# Patient Record
Sex: Female | Born: 1975 | Race: White | Hispanic: No | Smoking: Current every day smoker
Health system: Southern US, Community
[De-identification: ages and names within clinical notes are randomized; demographics above are authoritative.]

## PROBLEM LIST (undated history)

## (undated) DIAGNOSIS — C3490 Malignant neoplasm of unspecified part of unspecified bronchus or lung: Secondary | ICD-10-CM

## (undated) DIAGNOSIS — R002 Palpitations: Secondary | ICD-10-CM

## (undated) DIAGNOSIS — F419 Anxiety disorder, unspecified: Secondary | ICD-10-CM

## (undated) DIAGNOSIS — F32A Depression, unspecified: Secondary | ICD-10-CM

## (undated) DIAGNOSIS — R079 Chest pain, unspecified: Secondary | ICD-10-CM

## (undated) DIAGNOSIS — R569 Unspecified convulsions: Secondary | ICD-10-CM

## (undated) HISTORY — DX: Chest pain, unspecified: R07.9

## (undated) HISTORY — DX: Palpitations: R00.2

## (undated) HISTORY — DX: Anxiety disorder, unspecified: F41.9

## (undated) HISTORY — DX: Unspecified convulsions: R56.9

## (undated) HISTORY — PX: TUBAL LIGATION: SHX77

## (undated) HISTORY — PX: MYRINGOTOMY WITH TUBE PLACEMENT: SHX5663

## (undated) HISTORY — PX: CARDIAC SURGERY: SHX584

## (undated) HISTORY — DX: Malignant neoplasm of unspecified part of unspecified bronchus or lung: C34.90

## (undated) HISTORY — DX: Depression, unspecified: F32.A

---

## 2020-12-04 ENCOUNTER — Emergency Department (HOSPITAL_COMMUNITY)
Admission: EM | Admit: 2020-12-04 | Discharge: 2020-12-04 | Disposition: A | Discharge status: Home / Self Care | Payer: Medicaid Other | Attending: Emergency Medicine | Admitting: Emergency Medicine

## 2020-12-04 ENCOUNTER — Encounter (HOSPITAL_COMMUNITY): Payer: Self-pay

## 2020-12-04 ENCOUNTER — Emergency Department (HOSPITAL_COMMUNITY): Payer: Medicaid Other

## 2020-12-04 ENCOUNTER — Other Ambulatory Visit: Payer: Self-pay

## 2020-12-04 DIAGNOSIS — R55 Syncope and collapse: Secondary | ICD-10-CM | POA: Diagnosis not present

## 2020-12-04 DIAGNOSIS — R059 Cough, unspecified: Secondary | ICD-10-CM | POA: Diagnosis present

## 2020-12-04 DIAGNOSIS — Z8701 Personal history of pneumonia (recurrent): Secondary | ICD-10-CM | POA: Diagnosis not present

## 2020-12-04 NOTE — ED Provider Notes (Signed)
[facility] DEPT Provider Note   CSN: [id number] Arrival date & time: 12/04/20  1109     History Chief Complaint  Patient presents with  . Cough    Andrea Holloway is a 45 y.o. female.  Pt reports she was seen in the [facility] ED and diagnosed with pneumonia.  Pt had a follow up ct and has a mass.  Pt is scheduled to follow up with pulmonary in [place] on 4/11.  Pt reports she coughed so   No language interpreter was used.  Cough Cough characteristics:  Productive Sputum characteristics:  Nondescript Severity:  Moderate Onset quality:  Gradual Timing:  Constant Progression:  Worsening Chronicity:  New Relieved by:  Nothing Worsened by:  Nothing Ineffective treatments:  None tried Risk factors: recent infection        History reviewed. No pertinent past medical history.  There are no problems to display for this patient.   History reviewed. No pertinent surgical history.   OB History   No obstetric history on file.     History reviewed. No pertinent family history.     Home Medications Prior to Admission medications   Not on File    Allergies    Iodine  Review of Systems   Review of Systems  Respiratory: Positive for cough.   All other systems reviewed and are negative.   Physical Exam Updated Vital Signs BP 121/87   Pulse 84   Temp 97.7 F (36.5 C) (Oral)   Resp 18   LMP 12/04/2020   SpO2 98%   Physical Exam Vitals reviewed.  HENT:     Head: Normocephalic.     Mouth/Throat:     Mouth: Mucous membranes are moist.  Cardiovascular:     Rate and Rhythm: Normal rate.     Pulses: Normal pulses.  Pulmonary:     Effort: Pulmonary effort is normal.  Musculoskeletal:        General: Normal range of motion.     Cervical back: Normal range of motion.  Skin:    General: Skin is warm.  Neurological:     General: No focal deficit present.     Mental Status: She is alert.  Psychiatric:        Mood and Affect: Mood  normal.     ED Results / Procedures / Treatments   Labs (all labs ordered are listed, but only abnormal results are displayed) Labs Reviewed - No data to display  EKG None  Radiology DG Chest [facility] 1 View  Result Date: 12/04/2020 CLINICAL DATA:  Progressive cough and back pain. EXAM: PORTABLE CHEST 1 VIEW COMPARISON:  Radiographs 11/29/2020 and 11/18/2020.  CT 11/29/2020. FINDINGS: 1158 hours. The heart size and mediastinal contours are stable with prominent right hilar and paratracheal soft tissue prominence. Underlying calcified right hilar and mediastinal lymph nodes are present. There is persistent consolidation and volume loss anteriorly in the right upper lobe, similar to recent prior studies. A small sub pulmonic right pleural effusion appears unchanged. The left lung is clear. A small metallic foreign body in the left retrocrural area appears unchanged. The bones are stable. IMPRESSION: Stable appearance of the chest with persistent mass-like right upper lobe consolidation suspicious for underlying malignancy as reported on recent CT. No acute superimposed findings identified. Electronically Signed   By: Richardean Sale M.D.   On: 12/04/2020 12:29    Procedures Procedures   Medications Ordered in ED Medications - No data to display  ED Course  I have reviewed the triage vital signs and the nursing notes.  Pertinent labs & imaging results that were available during my care of the patient were reviewed by me and considered in my medical decision making (see chart for details).    MDM Rules/Calculators/A&P                          MDM:  Pt counseled on chest xray results.  Pt has finished a course of cefdinir.  Pt offered rx for cough medication.  Pt did not have a pharmacy in chart, when I returned to room to ask her what pharmacy she uses, she had left without discharge papers  Final Clinical Impression(s) / ED Diagnoses Final diagnoses:  Cough  Vasovagal response    Rx /  DC Orders ED Discharge Orders    None    An After Visit Summary was printed and given to the patient.    Fransico Meadow, [place] 12/04/20 1431    Wyvonnia Dusky, MD 12/04/20 [phone]

## 2020-12-04 NOTE — ED Triage Notes (Signed)
Pt reports going to [facility] a few weeks ago and was diagnosed with pneumonia. Pt was prescribed antibiotics, but reported going back to the hospital because she ws not getting better. Pt states a CT scan showed a lump on her right lung and emphysema in her left. Pt states she is here today because of worsening cough to the point that she feels like she is going to pass out and back pain.

## 2020-12-04 NOTE — Discharge Instructions (Signed)
Return if any problems.  Call pulmonary tomorrow to discuss earlier appointment

## 2020-12-14 DIAGNOSIS — R918 Other nonspecific abnormal finding of lung field: Secondary | ICD-10-CM

## 2021-01-09 DIAGNOSIS — R918 Other nonspecific abnormal finding of lung field: Secondary | ICD-10-CM

## 2021-01-20 ENCOUNTER — Telehealth: Payer: Self-pay | Admitting: Oncology

## 2021-01-20 NOTE — Telephone Encounter (Signed)
Patient referred by Dr Gardiner Rhyme for Lung CA.  Appt made for 02/03/21 Labs 10:30 am - Consult 11:00 am

## 2021-01-24 NOTE — Progress Notes (Signed)
Patient called she is in a lot of pain and states that she has a fever as well. She thinks that she needs a MRI of the brian but the referring doctor will not order her one. She is not scheduled to see Dr. Bobby Rumpf until 02/03/2021. I spoke with Amy, triage nurse, and Lenna Sciara, NP. They recommended that she go to the ED because they cannot order anything until patient is seen. I called patient back and let her know, she stated that she was going to go to the ED at [facility] in [place].

## 2021-02-02 ENCOUNTER — Other Ambulatory Visit: Payer: Self-pay | Admitting: Oncology

## 2021-02-02 DIAGNOSIS — C3411 Malignant neoplasm of upper lobe, right bronchus or lung: Secondary | ICD-10-CM

## 2021-02-02 NOTE — Progress Notes (Signed)
Andrea Holloway  [address] [place],  [place]  [address] [phone]  Clinic Day:  02/03/2021  Referring physician:  Gardiner Rhyme, MD   HISTORY OF PRESENT ILLNESS:  The patient is a 45 y.o. female who I was asked to consult upon for newly diagnosed lung cancer.  Her history dates back 18 months ago when she began having right sided chest and back pain.  At the beginning of this year, she began having a productive cough.  A chest x-ray in March 2022 suggested pneumonia, for which she was given antibiotics.  As her symptoms did not improve, a chest CT was done in early April 2022, which showed a consolidative mass in her upper right lung, which appeared to be abutting her mediastinum.  She also had a PET scan done, which showed this lesion to be hypermetabolic.  Hypermetabolic nodes were seen in her right paratracheal and subcarinal stations.  She ultimately underwent a bronchoscopy with biopsy of her lung mass, whose pathology came back consistent with adenocarcinoma.  She had a brain MRI done yesterday, which fortunately did not show brain metastasis.  She comes in today to go over her biopsy and scan results, as well as their implications.  The patient continues to cough fairly heavily, with her right-sided chest wall pain remaining prominent.  She denies having hemoptysis.  She has smoked up to a pack of cigarettes daily x 25 years. She has lost less than 10 pounds over the past 6 months.    PAST MEDICAL HISTORY:   Past Medical History:  Diagnosis Date  . Anxiety   . Depression   . Lung cancer ([place])   . Seizures ([place])     PAST SURGICAL HISTORY:   Past Surgical History:  Procedure Laterality Date  . CARDIAC SURGERY     [place] OF FLUID BEHIND HEART @ 45 YEARS OF AGE  . MYRINGOTOMY WITH TUBE PLACEMENT Bilateral   . TUBAL LIGATION      CURRENT MEDICATIONS:   Current Outpatient Medications  Medication Sig Dispense Refill  . HYDROcodone-acetaminophen  (NORCO) 10-325 MG tablet Take 1 tablet by mouth every 6 (six) hours as needed. 40 tablet 0   No current facility-administered medications for this visit.    ALLERGIES:   Allergies  Allergen Reactions  . Iodine     FAMILY HISTORY:   Family History  Problem Relation Age of Onset  . Hodgkin's lymphoma Sister   . Breast cancer Maternal Aunt   . Thyroid cancer Maternal Grandmother   . Prostate cancer Maternal Grandfather   . Penile cancer Maternal Grandfather   . Colon cancer Cousin   . Colon cancer Cousin   . Colon cancer Cousin   . Lung cancer Cousin   . Brain cancer Cousin     SOCIAL HISTORY:  The patient was born in [place].  She lives with her daughter in [place].  She has 3 children and 2 grandchildren.  She had a cleaning service business for 6 years.  Her smoking history is as mentioned per HPI.  She does not drink alcohol.    REVIEW OF SYSTEMS:  Review of Systems  Constitutional: Negative for fatigue and fever.  HENT:   Negative for hearing loss and sore throat.        Right-sided otalgia  Eyes: Negative for eye problems.  Respiratory: Positive for cough. Negative for chest tightness and hemoptysis.   Cardiovascular: Negative for chest pain and palpitations.  Gastrointestinal: Positive  for constipation. Negative for abdominal distention, abdominal pain, blood in stool, diarrhea, nausea and vomiting.  Endocrine: Negative for hot flashes.  Genitourinary: Negative for difficulty urinating, dysuria, frequency, hematuria and nocturia.   Musculoskeletal: Positive for neck pain. Negative for arthralgias, back pain, gait problem and myalgias.  Skin: Negative.  Negative for itching and rash.  Neurological: Positive for dizziness and headaches. Negative for extremity weakness, gait problem, light-headedness and numbness.  Hematological: Negative.   Psychiatric/Behavioral: Negative for depression and suicidal ideas. The patient is nervous/anxious.      PHYSICAL  EXAM:  Blood pressure 135/85, pulse 78, temperature 98.1 F (36.7 C), resp. rate 16, height 5\' 4"  (1.626 m), weight 147 lb 1.6 oz (66.7 kg), SpO2 98 %. Wt Readings from Last 3 Encounters:  02/03/21 147 lb 1.6 oz (66.7 kg)   Body mass index is 25.25 kg/m. Performance status (ECOG): 1 - Symptomatic but completely ambulatory Physical Exam Constitutional:      Appearance: Normal appearance. She is not ill-appearing.  HENT:     Mouth/Throat:     Mouth: Mucous membranes are moist.     Pharynx: Oropharynx is clear. No oropharyngeal exudate or posterior oropharyngeal erythema.  Cardiovascular:     Rate and Rhythm: Normal rate and regular rhythm.     Heart sounds: No murmur heard. No friction rub. No gallop.   Pulmonary:     Effort: Pulmonary effort is normal. No respiratory distress.     Breath sounds: Normal breath sounds. No wheezing, rhonchi or rales.  Chest:  Breasts:     Right: No axillary adenopathy or supraclavicular adenopathy.     Left: No axillary adenopathy or supraclavicular adenopathy.    Abdominal:     General: Bowel sounds are normal. There is no distension.     Palpations: Abdomen is soft. There is no mass.     Tenderness: There is no abdominal tenderness.  Musculoskeletal:        General: No swelling.     Right lower leg: No edema.     Left lower leg: No edema.  Lymphadenopathy:     Cervical: No cervical adenopathy.     Upper Body:     Right upper body: No supraclavicular or axillary adenopathy.     Left upper body: No supraclavicular or axillary adenopathy.     Lower Body: No right inguinal adenopathy. No left inguinal adenopathy.  Skin:    General: Skin is warm.     Coloration: Skin is not jaundiced.     Findings: No lesion or rash.  Neurological:     General: No focal deficit present.     Mental Status: She is alert and oriented to person, place, and time. Mental status is at baseline.     Cranial Nerves: Cranial nerves are intact.  Psychiatric:         Mood and Affect: Mood normal.        Behavior: Behavior normal.        Thought Content: Thought content normal.    LABS:   CBC Latest Ref Rng & Units 02/03/2021  WBC - 8.9  Hemoglobin 12.0 - 16.0 15.7  Hematocrit 36 - 46 45  Platelets 150 - 399 301   CMP Latest Ref Rng & Units 02/03/2021  BUN 4 - 21 6  Creatinine 0.5 - 1.1 0.6  Sodium 137 - 147 134(A)  Potassium 3.4 - 5.3 3.9  Chloride 99 - 108 101  CO2 13 - 22 26(A)  Calcium 8.7 - 10.7  8.7  Alkaline Phos 25 - 125 77  AST 13 - 35 29  ALT 7 - 35 25   ASSESSMENT & PLAN:  A 45 y.o. female who I was asked to consult upon for what clinically appears to be stage IIIB (T4 N2 M0) lung adenocarcinoma.  As a PET scan shows multilevel N2 disease, she does not appear to be a surgical candidate.  As she has no evidence of metastatic disease, the goal will be to use multimodality therapy to ultimately cure her of her disease.  She will start with concurrent chemoradiation, which will consist of weekly carboplatin/paclitaxel. She was made aware of the side effects which can go along with her weekly chemotherapy, including hair thinning, cytopenias, nausea and fatigue. Her radiation will be given daily.  Her chemoradiation will tentatively start on Monday, June 13th.  I will arrange for her to have a port placed through which her chemotherapy will be given.  CT scans will be done after all of her chemoradiation is completed.  If she has a good response to treatment, maintenance durvalumab immunotherapy would be given for 1 year to further decrease her chances of her disease ever coming back.  I will see this patient back in 3-4 weeks to ensure she is doing well as she is going through her chemoradiation.  The patient understands all the plans discussed today and is in agreement with them.  I do appreciate Dr Gardiner Rhyme  for his new consult.   Kimmy Totten Macarthur Critchley, MD

## 2021-02-03 ENCOUNTER — Other Ambulatory Visit: Payer: Self-pay | Admitting: Oncology

## 2021-02-03 ENCOUNTER — Inpatient Hospital Stay: Payer: Medicaid Other | Attending: Oncology

## 2021-02-03 ENCOUNTER — Encounter: Payer: Self-pay | Admitting: Oncology

## 2021-02-03 ENCOUNTER — Inpatient Hospital Stay (INDEPENDENT_AMBULATORY_CARE_PROVIDER_SITE_OTHER): Payer: Medicaid Other | Admitting: Oncology

## 2021-02-03 ENCOUNTER — Other Ambulatory Visit: Payer: Self-pay

## 2021-02-03 VITALS — BP 135/85 | HR 78 | Temp 98.1°F | Resp 16 | Ht 64.0 in | Wt 147.1 lb

## 2021-02-03 DIAGNOSIS — Z803 Family history of malignant neoplasm of breast: Secondary | ICD-10-CM | POA: Insufficient documentation

## 2021-02-03 DIAGNOSIS — Z8042 Family history of malignant neoplasm of prostate: Secondary | ICD-10-CM | POA: Insufficient documentation

## 2021-02-03 DIAGNOSIS — Z8049 Family history of malignant neoplasm of other genital organs: Secondary | ICD-10-CM | POA: Insufficient documentation

## 2021-02-03 DIAGNOSIS — Z5111 Encounter for antineoplastic chemotherapy: Secondary | ICD-10-CM | POA: Insufficient documentation

## 2021-02-03 DIAGNOSIS — F17210 Nicotine dependence, cigarettes, uncomplicated: Secondary | ICD-10-CM | POA: Insufficient documentation

## 2021-02-03 DIAGNOSIS — C3411 Malignant neoplasm of upper lobe, right bronchus or lung: Secondary | ICD-10-CM

## 2021-02-03 DIAGNOSIS — Z807 Family history of other malignant neoplasms of lymphoid, hematopoietic and related tissues: Secondary | ICD-10-CM | POA: Insufficient documentation

## 2021-02-03 DIAGNOSIS — Z801 Family history of malignant neoplasm of trachea, bronchus and lung: Secondary | ICD-10-CM | POA: Insufficient documentation

## 2021-02-03 HISTORY — DX: Malignant neoplasm of upper lobe, right bronchus or lung: C34.11

## 2021-02-03 LAB — BASIC METABOLIC PANEL WITH GFR
BUN: 6 (ref 4–21)
CO2: 26 — AB (ref 13–22)
Chloride: 101 (ref 99–108)
Creatinine: 0.6 (ref 0.5–1.1)
Glucose: 93
Potassium: 3.9 (ref 3.4–5.3)
Sodium: 134 — AB (ref 137–147)

## 2021-02-03 LAB — CBC AND DIFFERENTIAL
HCT: 45 (ref 36–46)
Hemoglobin: 15.7 (ref 12.0–16.0)
Neutrophils Absolute: 6.5
Platelets: 301 (ref 150–399)
WBC: 8.9

## 2021-02-03 LAB — COMPREHENSIVE METABOLIC PANEL WITH GFR
Albumin: 4.2 (ref 3.5–5.0)
Calcium: 8.7 (ref 8.7–10.7)

## 2021-02-03 LAB — HEPATIC FUNCTION PANEL
ALT: 25 (ref 7–35)
AST: 29 (ref 13–35)
Alkaline Phosphatase: 77 (ref 25–125)
Bilirubin, Total: 0.7

## 2021-02-03 LAB — CBC: RBC: 4.91 (ref 3.87–5.11)

## 2021-02-03 MED ORDER — HYDROCODONE-ACETAMINOPHEN 10-325 MG PO TABS: 1.0000 | ORAL_TABLET | Freq: Four times a day (QID) | ORAL | 0 refills | Status: DC | PRN

## 2021-02-03 NOTE — Progress Notes (Signed)
Spoke with patient, she does not have any insurance. She has financial assistance through [facility] and is applying for Medicaid and maybe Disability. Helped her enroll into the [employer] fund to help with cost of medication. Also had her sign a financial assistance form for [place] for when she sees Dr. Lilia Pro.

## 2021-02-03 NOTE — Progress Notes (Signed)

## 2021-02-07 ENCOUNTER — Inpatient Hospital Stay: Payer: Medicaid Other | Admitting: Hematology and Oncology

## 2021-02-07 ENCOUNTER — Other Ambulatory Visit: Payer: Self-pay | Admitting: Hematology and Oncology

## 2021-02-07 ENCOUNTER — Inpatient Hospital Stay: Payer: Medicaid Other

## 2021-02-07 DIAGNOSIS — C3411 Malignant neoplasm of upper lobe, right bronchus or lung: Secondary | ICD-10-CM

## 2021-02-07 NOTE — Progress Notes (Deleted)
The patient is a with newly diagnosed.  Patient presents to clinic today for chemotherapy education and palliative care consult.  We will start.  We will send in prescriptions for prochlorperazine and ondansetron.  The patient verbalizes understanding of and agreement to the plan as discussed today.  Provided general information including the following: 1.  Date of education: 2.  Physician name: 3.  Diagnosis: 4.  Stage: 5.  Curative or palliative 6.  Chemotherapy plan including drugs and how often: 7.  Start date: 60.  Other referrals: 9.  The patient is to call our office with any questions or concerns.  Our office number [phone], if after hours or on the weekend, call the same number and wait for the answering service.  There is always an oncologist on call 10.  Medications prescribed: 11.  The patient has verbalized understanding of the treatment plan and has no barriers to adherence or understanding.  Obtained signed consent from patient.  Discussed symptoms including 1.  Low blood counts including red blood cells, white blood cells and platelets. 2. Infection including to avoid large crowds, wash hands frequently, and stay away from people who were sick.  If fever develops of 100.4 or higher, call our office. 3.  Mucositis-given instructions on mouth rinse (baking soda and salt mixture).  Keep mouth clean.  Use soft bristle toothbrush.  If mouth sores develop, call our clinic. 4.  Nausea/vomiting-gave prescriptions for ondansetron 4 mg every 4 hours as needed for nausea, may take around the clock if persistent.  Compazine 10 mg every 6 hours, may take around the clock if persistent. 5.  Diarrhea-use over-the-counter Imodium.  Call clinic if not controlled. 6.  Constipation-use senna, 1 to 2 tablets twice a day.  If no BM in 2 to 3 days call the clinic. 7.  Loss of appetite-try to eat small meals every 2-3 hours.  Call clinic if not eating. 8.  Taste changes-zinc 500 mg daily.  If  becomes severe call clinic. 9.  Alcoholic beverages. 10.  Drink 2 to 3 quarts of water per day. 11.  Peripheral neuropathy-patient to call if numbness or tingling in hands or feet is persistent  Neulasta-will be given 24 to 48 hours after chemotherapy.  Gave information sheet on bone and joint pain.  Use Claritin or Pepcid.  May use ibuprofen or Aleve.  Call if symptoms persist or are unbearable.  Gave information on the supportive care team and how to contact them regarding services.  Discussed advanced directives.  The patient does not have their advanced directives but will look at the copy provided in their notebook and will call with any questions. [employer] Social worker Advanced directives  Answered questions to patient satisfaction.  Patient is to call with any further questions or concerns.  Time spent on this palliative care/chemotherapy education was 60 minutes with more than 50% spent discussing diagnosis, prognosis and symptom management.  The medication prescribed to the patient will be printed out from [email or web address] This will give the following information: Name of your medication Approved uses Dose and schedule Storage and handling Handling body fluids and waste Drug and food interactions Possible side effects and management Pregnancy, sexual activity, and contraception Obtaining medication

## 2021-02-08 ENCOUNTER — Other Ambulatory Visit: Payer: Self-pay | Admitting: Hematology and Oncology

## 2021-02-08 ENCOUNTER — Inpatient Hospital Stay (INDEPENDENT_AMBULATORY_CARE_PROVIDER_SITE_OTHER): Payer: Medicaid Other | Admitting: Hematology and Oncology

## 2021-02-08 MED ORDER — OXYCODONE HCL 10 MG PO TABS: 10.0000 mg | ORAL_TABLET | ORAL | 0 refills | Status: DC | PRN

## 2021-02-08 MED ORDER — DEXAMETHASONE 4 MG PO TABS: ORAL_TABLET | ORAL | 0 refills | Status: DC

## 2021-02-08 MED ORDER — ONDANSETRON HCL 4 MG PO TABS: 4.0000 mg | ORAL_TABLET | ORAL | 3 refills | Status: DC | PRN

## 2021-02-08 MED ORDER — IBUPROFEN 400 MG PO TABS: 400.0000 mg | ORAL_TABLET | Freq: Four times a day (QID) | ORAL | 0 refills | Status: DC | PRN

## 2021-02-08 MED ORDER — OXYCODONE HCL ER 15 MG PO T12A: 15.0000 mg | EXTENDED_RELEASE_TABLET | Freq: Two times a day (BID) | ORAL | 0 refills | Status: DC

## 2021-02-08 MED ORDER — PROCHLORPERAZINE MALEATE 10 MG PO TABS: 10.0000 mg | ORAL_TABLET | Freq: Four times a day (QID) | ORAL | 3 refills | Status: DC | PRN

## 2021-02-08 NOTE — Progress Notes (Signed)
on

## 2021-02-08 NOTE — Addendum Note (Signed)
Addended by: [name] on: 02/08/2021 01:48 PM   Modules accepted: Level of Service

## 2021-02-08 NOTE — Progress Notes (Signed)
The patient is a 45 year old female with newly diagnosed lung cancer.  Patient presents to clinic today for chemotherapy education and palliative care consult.  We will start concurrent radiation therapy with paclitaxel and carboplatin .  We will send in prescriptions for dexamethasone, prochlorperazine and ondansetron.  The patient verbalizes understanding of and agreement to the plan as discussed today.  Provided general information including the following: 1.  Date of education: 02-08-2021 2.  Physician name: Dr. Bobby Rumpf 3.  Diagnosis: Lung Cancer 4.  Stage: stage IIIB 5.  Palliative 6.  Chemotherapy plan including drugs and how often: Palliative 7.  Start date: 02-08-2021 8.  Other referrals: None at this time 9.  The patient is to call our office with any questions or concerns.  Our office number [phone], if after hours or on the weekend, call the same number and wait for the answering service.  There is always an oncologist on call 10.  Medications prescribed: dexamethasone, prochlorperazine, ondansetron 11.  The patient has verbalized understanding of the treatment plan and has no barriers to adherence or understanding.  Obtained signed consent from patient.  Discussed symptoms including 1.  Low blood counts including red blood cells, white blood cells and platelets. 2. Infection including to avoid large crowds, wash hands frequently, and stay away from people who were sick.  If fever develops of 100.4 or higher, call our office. 3.  Mucositis-given instructions on mouth rinse (baking soda and salt mixture).  Keep mouth clean.  Use soft bristle toothbrush.  If mouth sores develop, call our clinic. 4.  Nausea/vomiting-gave prescriptions for ondansetron 4 mg every 4 hours as needed for nausea, may take around the clock if persistent.  Compazine 10 mg every 6 hours, may take around the clock if persistent. 5.  Diarrhea-use over-the-counter Imodium.  Call clinic if not controlled. 6.   Constipation-use senna, 1 to 2 tablets twice a day.  If no BM in 2 to 3 days call the clinic. 7.  Loss of appetite-try to eat small meals every 2-3 hours.  Call clinic if not eating. 8.  Taste changes-zinc 500 mg daily.  If becomes severe call clinic. 9.  Alcoholic beverages. 10.  Drink 2 to 3 quarts of water per day. 11.  Peripheral neuropathy-patient to call if numbness or tingling in hands or feet is persistent   Gave information on the supportive care team and how to contact them regarding services.  Discussed advanced directives.  The patient does not have their advanced directives but will look at the copy provided in their notebook and will call with any questions. [employer] Social worker Advanced directives  Answered questions to patient satisfaction.  Patient is to call with any further questions or concerns.  [name], FNP- [facility]

## 2021-02-10 ENCOUNTER — Encounter: Payer: Self-pay | Admitting: Oncology

## 2021-02-13 ENCOUNTER — Ambulatory Visit: Payer: Self-pay

## 2021-02-14 ENCOUNTER — Other Ambulatory Visit: Payer: Self-pay | Admitting: Oncology

## 2021-02-14 NOTE — Progress Notes (Signed)
DISCONTINUE SELECTED CLINICAL TRIAL - Non-Small Cell Lung  Trial: A Master Protocol to Evaluate Biomarker-Driven Therapies and Immunotherapies in Previously-Treated Non-Small Cell Lung Cancer (Lung-MAP Screening Study)  **Trial eligibility and accrual should be confirmed by your research team**  REASON: Other Reason PRIOR TREATMENT: Trial: A Master Protocol to Evaluate Biomarker-Driven Therapies and Immunotherapies in Previously-Treated Non-Small Cell Lung Cancer (Lung-MAP Screening Study) TREATMENT RESPONSE: Unable to Evaluate  START ON PATHWAY REGIMEN - Non-Small Cell Lung     A cycle is every 21 days:     Pemetrexed      Carboplatin   **Always confirm dose/schedule in your pharmacy ordering system**  Patient Characteristics: Stage IV Metastatic, Nonsquamous, Awaiting Molecular Test Results and Need to Start Chemotherapy, PS = 0, 1 Therapeutic Status: Stage IV Metastatic Histology: Nonsquamous Cell Broad Molecular Profiling Status: Awaiting Molecular Test Results and Need to Start Chemotherapy ECOG Performance Status: 1 Intent of Therapy: Non-Curative / Palliative Intent, Discussed with Patient

## 2021-02-15 ENCOUNTER — Inpatient Hospital Stay: Payer: Medicaid Other

## 2021-02-15 ENCOUNTER — Other Ambulatory Visit: Payer: Self-pay | Admitting: Oncology

## 2021-02-15 ENCOUNTER — Inpatient Hospital Stay (INDEPENDENT_AMBULATORY_CARE_PROVIDER_SITE_OTHER): Payer: Medicaid Other | Admitting: Oncology

## 2021-02-15 ENCOUNTER — Other Ambulatory Visit: Payer: Self-pay | Admitting: Pharmacist

## 2021-02-15 VITALS — BP 136/68 | HR 68 | Temp 98.0°F | Resp 18 | Ht 64.0 in | Wt 146.0 lb

## 2021-02-15 DIAGNOSIS — C3411 Malignant neoplasm of upper lobe, right bronchus or lung: Secondary | ICD-10-CM

## 2021-02-15 DIAGNOSIS — Z8049 Family history of malignant neoplasm of other genital organs: Secondary | ICD-10-CM | POA: Diagnosis not present

## 2021-02-15 DIAGNOSIS — F17210 Nicotine dependence, cigarettes, uncomplicated: Secondary | ICD-10-CM | POA: Diagnosis not present

## 2021-02-15 DIAGNOSIS — Z803 Family history of malignant neoplasm of breast: Secondary | ICD-10-CM | POA: Diagnosis not present

## 2021-02-15 DIAGNOSIS — Z807 Family history of other malignant neoplasms of lymphoid, hematopoietic and related tissues: Secondary | ICD-10-CM | POA: Diagnosis not present

## 2021-02-15 DIAGNOSIS — Z5111 Encounter for antineoplastic chemotherapy: Secondary | ICD-10-CM | POA: Diagnosis present

## 2021-02-15 DIAGNOSIS — Z8042 Family history of malignant neoplasm of prostate: Secondary | ICD-10-CM | POA: Diagnosis not present

## 2021-02-15 DIAGNOSIS — Z801 Family history of malignant neoplasm of trachea, bronchus and lung: Secondary | ICD-10-CM | POA: Diagnosis not present

## 2021-02-15 LAB — TSH: TSH: 4.052 u[IU]/mL (ref 0.350–4.500)

## 2021-02-15 MED ORDER — LORAZEPAM 1 MG PO TABS: 1.0000 mg | ORAL_TABLET | Freq: Three times a day (TID) | ORAL | 1 refills | Status: DC | PRN

## 2021-02-15 NOTE — Progress Notes (Signed)
The following Medication: Emend IV has been approved thru [employer] Patient Assistance as Assistance Program. Enrollment period is 02/15/2021 to 02/14/2022.  Assistance ID: . Reason for Assistance: No Insurance First DOS: 02/20/2021

## 2021-02-15 NOTE — Progress Notes (Signed)
Clinic Day:  02/15/2021   Referring physician: No ref. provider found     HISTORY OF PRESENT ILLNESS:  The patient is a 45 y.o. female with stage IIIB (T4 N2 M0) lung adenocarcinoma.  She comes in today as a repeat chest CT showed that her right lung mass had increased in size.  Furthermore, there was a significant increase in her right pleural effusion, which led to thoracentesis being done.  She comes in today to go over the cytology results of her effusion and their implications.  Overall, the patient has been doing okay.  However, she definitely has been coughing more.  Her shortness of breath has also gotten worse.  Her anxiety levels are higher as she is understandably worried about her disease.     PHYSICAL EXAM:  There were no vitals taken for this visit.    Wt Readings from Last 3 Encounters:  02/15/21 146 lb (66.2 kg)  02/08/21 144 lb 9.6 oz (65.6 kg)  02/03/21 147 lb 1.6 oz (66.7 kg)    There is no height or weight on file to calculate BMI. Performance status (ECOG): 1 - Symptomatic but completely ambulatory Physical Exam Constitutional:      Appearance: Normal appearance. She is not ill-appearing. HENT:    Mouth/Throat:    Mouth: Mucous membranes are moist.    Pharynx: Oropharynx is clear. No oropharyngeal exudate or posterior oropharyngeal erythema. Cardiovascular:    Rate and Rhythm: Normal rate and regular rhythm.    Heart sounds: No murmur heard.   No friction rub. No gallop. Pulmonary:    Effort: Pulmonary effort is normal. No respiratory distress.    Breath sounds: No wheezing, rhonchi or rales.    Comments: Decreased breath sounds in her right lung base Chest: Breasts:    Right: No axillary adenopathy or supraclavicular adenopathy.    Left: No axillary adenopathy or supraclavicular adenopathy. Abdominal:    General: Bowel sounds are normal. There is no distension.    Palpations: Abdomen is soft. There is no mass.    Tenderness: There is no abdominal  tenderness. Musculoskeletal:        General: No swelling.    Right lower leg: No edema.    Left lower leg: No edema. Lymphadenopathy:    Cervical: No cervical adenopathy.    Upper Body:    Right upper body: No supraclavicular or axillary adenopathy.    Left upper body: No supraclavicular or axillary adenopathy.    Lower Body: No right inguinal adenopathy. No left inguinal adenopathy. Skin:    General: Skin is warm.    Coloration: Skin is not jaundiced.    Findings: No lesion or rash. Neurological:    General: No focal deficit present.    Mental Status: She is alert and oriented to person, place, and time. Mental status is at baseline.    Cranial Nerves: Cranial nerves are intact. Psychiatric:        Mood and Affect: Mood normal.        Behavior: Behavior normal.        Thought Content: Thought content normal.   SCANS:  Her recent chest CT revealed the following: FINDINGS: Cardiovascular: Heart size stable. No substantial pericardial effusion. Normal caliber of the thoracic aorta. Normal caliber of central pulmonary vessels. Limited assessment of cardiovascular structures given lack of intravenous contrast.   Mediastinum/Nodes: Subcarinal nodal enlargement (image 49/2) 1.8 cm as compared to 1.6 cm short axis.   (Image 29/2) RIGHT paratracheal nodal enlargement, mild 1.1 cm.  Difficult to assess in terms of size perhaps even slightly smaller than on the prior study where it measured approximately 1.2 cm.   Scattered small lymph nodes elsewhere in the chest. Signs of prior granulomatous disease as well. No thoracic inlet lymphadenopathy. No axillary lymphadenopathy.   RIGHT upper lobe mass with associated volume loss shows convex margins and extends to the RIGHT lung apex measuring 7 by 6.1 cm greatest axial dimension previously 5 x 5.8 cm. See below.   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 Enlarging RIGHT-sided pleural effusion. This is now moderate.   Signs of background pulmonary emphysema. Airways are patent. LEFT chest with small lingular nodule (image 84/4) 5 mm, new compared to previous imaging.   Upper Abdomen: Incidental imaging of upper abdominal contents, unremarkable appearance of visualized liver, pancreas, spleen and adrenal glands. Upper portions of the bilateral kidneys unremarkable. No acute gastrointestinal process.   Musculoskeletal: No acute musculoskeletal process. No destructive bone finding.   IMPRESSION: 1. Further volume loss with convex margins in the RIGHT upper lobe and increased soft tissue in this patient that previously displayed signs of lymphangitic carcinomatosis and consolidation. Findings are concerning for worsening of disease. PET scan may be helpful for further evaluation. 2. 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 Given consolidative changes would also correlate with any signs of infection. 3. New 5 mm lingular nodule. Indeterminate but suspicious given other findings. 4. Enlargement of subcarinal nodal tissue as discussed. 5. Emphysema and aortic atherosclerosis.   Aortic Atherosclerosis (ICD10-I70.0) and Emphysema (ICD10-J43.9).   LABS:    CBC Latest Ref Rng & Units 02/03/2021  WBC - 8.9  Hemoglobin 12.0 - 16.0 15.7  Hematocrit 36 - 46 45  Platelets 150 - 399 301    CMP Latest Ref Rng & Units 02/03/2021  BUN 4 - 21 6  Creatinine 0.5 - 1.1 0.6  Sodium 137 - 147 134(A)  Potassium 3.4 - 5.3 3.9  Chloride 99 - 108 101  CO2 13 - 22 26(A)  Calcium 8.7 - 10.7 8.7  Alkaline Phos 25 - 125  77  AST 13 - 35 29  ALT 7 - 35 25    STUDIES:  Cytology of her right pleural effusion revealed the following:      ASSESSMENT & PLAN:  Assessment/Plan:  A 45 y.o. female who unfortunately appears to have stage IVA (T4 N2 M1a) lung adenocarcinoma.  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  There remains no evidence of metastatic disease.  Nevertheless, she essentially has incurable disease.  Her plans will be changed based upon this.  As she is symptomatic from her disease, I will start her on carboplatin/pemetrexed/pembrolizumab, which she will receive every 3 weeks.  This will be given for 4-6 cycles, with scans being repeated afterwards.  If there is an improvement in her disease, I would continue her on maintenance pemetrexed/pembrolizumab.  Of note, I will have her previously biopsied lung tissue sent out to [facility] to see if she may be eligible for some form of targeted therapy.  Usually, I would wait for these test results  to come back before starting some form of systemic therapy.  However, as she is symptomatic from her cancer, her carboplatin/pemetrexed/pembrolizumab will begin this week.  I will see her back in 3 weeks before she heads into her 2nd cycle of treatment. The patient understands all the plans discussed today and is in agreement with them.         Kavita Bartl Macarthur Critchley, MD

## 2021-02-15 NOTE — Progress Notes (Signed)
Spoke with patient while she was in seeing Dr. Bobby Rumpf. Had her sign free medication applications for Emend IV, Keytruda, and Alimta. Faxed them all in today. Helped her fill out an application for [employer] , she still needs to bring in additional information before we can fax in. She is in the process of applying for disability and Medicaid.

## 2021-02-15 NOTE — Progress Notes (Unsigned)
[facility]  [address] [place],  [place]  [address] [phone]  Clinic Day:  02/15/2021  Referring physician: No ref. provider found   HISTORY OF PRESENT ILLNESS:  The patient is a 45 y.o. female with stage IIIB (T4 N2 M0) lung adenocarcinoma.  She comes in today as a repeat chest CT showed that her right lung mass had increased in size.  Furthermore, there was a significant increase in her right pleural effusion, which led to thoracentesis being done.  She comes in today to go over the cytology results of her effusion and their implications.  Overall, the patient has been doing okay.  However, she definitely has been coughing more.  Her shortness of breath has also gotten worse.  Her anxiety levels are higher as she is understandably worried about her disease.    PHYSICAL EXAM:  There were no vitals taken for this visit. Wt Readings from Last 3 Encounters:  02/15/21 146 lb (66.2 kg)  02/08/21 144 lb 9.6 oz (65.6 kg)  02/03/21 147 lb 1.6 oz (66.7 kg)   There is no height or weight on file to calculate BMI. Performance status (ECOG): 1 - Symptomatic but completely ambulatory Physical Exam Constitutional:      Appearance: Normal appearance. She is not ill-appearing.  HENT:     Mouth/Throat:     Mouth: Mucous membranes are moist.     Pharynx: Oropharynx is clear. No oropharyngeal exudate or posterior oropharyngeal erythema.  Cardiovascular:     Rate and Rhythm: Normal rate and regular rhythm.     Heart sounds: No murmur heard.   No friction rub. No gallop.  Pulmonary:     Effort: Pulmonary effort is normal. No respiratory distress.     Breath sounds: No wheezing, rhonchi or rales.     Comments: Decreased breath sounds in her right lung base Chest:  Breasts:    Right: No axillary adenopathy or supraclavicular adenopathy.     Left: No axillary adenopathy or supraclavicular adenopathy.  Abdominal:     General: Bowel sounds are normal. There  is no distension.     Palpations: Abdomen is soft. There is no mass.     Tenderness: There is no abdominal tenderness.  Musculoskeletal:        General: No swelling.     Right lower leg: No edema.     Left lower leg: No edema.  Lymphadenopathy:     Cervical: No cervical adenopathy.     Upper Body:     Right upper body: No supraclavicular or axillary adenopathy.     Left upper body: No supraclavicular or axillary adenopathy.     Lower Body: No right inguinal adenopathy. No left inguinal adenopathy.  Skin:    General: Skin is warm.     Coloration: Skin is not jaundiced.     Findings: No lesion or rash.  Neurological:     General: No focal deficit present.     Mental Status: She is alert and oriented to person, place, and time. Mental status is at baseline.     Cranial Nerves: Cranial nerves are intact.  Psychiatric:        Mood and Affect: Mood normal.        Behavior: Behavior normal.        Thought Content: Thought content normal.   SCANS:  Her recent chest CT revealed the following: FINDINGS: Cardiovascular: Heart size stable. No substantial pericardial effusion. Normal caliber of the thoracic aorta.  Normal caliber of central pulmonary vessels. Limited assessment of cardiovascular structures given lack of intravenous contrast.  Mediastinum/Nodes: Subcarinal nodal enlargement (image 49/2) 1.8 cm as compared to 1.6 cm short axis.  (Image 29/2) RIGHT paratracheal nodal enlargement, mild 1.1 cm. Difficult to assess in terms of size perhaps even slightly smaller than on the prior study where it measured approximately 1.2 cm.  Scattered small lymph nodes elsewhere in the chest. Signs of prior granulomatous disease as well. No thoracic inlet lymphadenopathy. No axillary lymphadenopathy.  RIGHT upper lobe mass with associated volume loss shows convex margins and extends to the RIGHT lung apex measuring 7 by 6.1 cm greatest axial dimension previously 5 x 5.8 cm. See  below.  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 Enlarging RIGHT-sided pleural effusion. This is now moderate.  Signs of background pulmonary emphysema. Airways are patent. LEFT chest with small lingular nodule (image 84/4) 5 mm, new compared to previous imaging.  Upper Abdomen: Incidental imaging of upper abdominal contents, unremarkable appearance of visualized liver, pancreas, spleen and adrenal glands. Upper portions of the bilateral kidneys unremarkable. No acute gastrointestinal process.  Musculoskeletal: No acute musculoskeletal process. No destructive bone finding.  IMPRESSION: 1. Further volume loss with convex margins in the RIGHT upper lobe and increased soft tissue in this patient that previously displayed signs of lymphangitic carcinomatosis and consolidation. Findings are concerning for worsening of disease. PET scan may be helpful for further evaluation. 2. 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 Given consolidative changes would also correlate with any signs of infection. 3. New 5 mm lingular nodule. Indeterminate but suspicious given other findings. 4. Enlargement of subcarinal nodal tissue as discussed. 5. Emphysema and aortic atherosclerosis.  Aortic Atherosclerosis (ICD10-I70.0) and Emphysema (ICD10-J43.9).  LABS:   CBC Latest Ref Rng & Units 02/03/2021  WBC - 8.9  Hemoglobin 12.0 - 16.0 15.7  Hematocrit 36 - 46 45  Platelets 150 - 399 301   CMP Latest Ref Rng & Units 02/03/2021  BUN 4 - 21 6  Creatinine 0.5 - 1.1 0.6  Sodium 137 - 147 134(A)   Potassium 3.4 - 5.3 3.9  Chloride 99 - 108 101  CO2 13 - 22 26(A)  Calcium 8.7 - 10.7 8.7  Alkaline Phos 25 - 125 77  AST 13 - 35 29  ALT 7 - 35 25   STUDIES:  Cytology of her right pleural effusion revealed the following:    ASSESSMENT & PLAN:  Assessment/Plan:  A 45 y.o. female who unfortunately appears to have stage IVA (T4 N2 M1a) lung adenocarcinoma.  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  There remains no evidence of metastatic disease.  Nevertheless, she essentially has incurable disease.  Her plans will be changed based upon this.  As she is symptomatic from her disease, I will start her on carboplatin/pemetrexed/pembrolizumab, which she will receive every 3 weeks.  This will be given for 4-6 cycles, with scans being repeated afterwards.  If there is an improvement in her disease, I would continue her on maintenance pemetrexed/pembrolizumab.  Of note, I will have her previously biopsied lung  tissue sent out to [facility] to see if she may be eligible for some form of targeted therapy.  Usually, I would wait for these test results to come back before starting some form of systemic therapy.  However, as she is symptomatic from her cancer, her carboplatin/pemetrexed/pembrolizumab will begin this week.  I will see her back in 3 weeks before she heads into her 2nd cycle of treatment. The patient understands all the plans discussed today and is in agreement with them.      Augustine Leverette Macarthur Critchley, MD

## 2021-02-16 ENCOUNTER — Other Ambulatory Visit: Payer: Self-pay | Admitting: Oncology

## 2021-02-16 ENCOUNTER — Other Ambulatory Visit: Payer: Self-pay | Admitting: Hematology and Oncology

## 2021-02-16 ENCOUNTER — Other Ambulatory Visit: Payer: Self-pay

## 2021-02-16 DIAGNOSIS — C3411 Malignant neoplasm of upper lobe, right bronchus or lung: Secondary | ICD-10-CM

## 2021-02-16 DIAGNOSIS — F411 Generalized anxiety disorder: Secondary | ICD-10-CM

## 2021-02-16 MED ORDER — LORAZEPAM 1 MG PO TABS: 1.0000 mg | ORAL_TABLET | Freq: Three times a day (TID) | ORAL | 0 refills | Status: DC | PRN

## 2021-02-16 NOTE — Progress Notes (Signed)
The following Medication: Alimta is approved for drug replacement program by [employer]. The enrollment period is from 02/16/2021 to 02/15/2022.  Reason for Assistance: No Insurance. ID: [id number] First DOS:02/20/2021.

## 2021-02-17 ENCOUNTER — Encounter: Payer: Self-pay | Admitting: Oncology

## 2021-02-17 ENCOUNTER — Other Ambulatory Visit: Payer: Self-pay

## 2021-02-17 DIAGNOSIS — C3411 Malignant neoplasm of upper lobe, right bronchus or lung: Secondary | ICD-10-CM

## 2021-02-17 DIAGNOSIS — Z5111 Encounter for antineoplastic chemotherapy: Secondary | ICD-10-CM | POA: Diagnosis not present

## 2021-02-17 LAB — HCG, SERUM, QUALITATIVE: Preg, Serum: NEGATIVE

## 2021-02-17 LAB — T4: T4, Total: 9.6 ug/dL (ref 4.5–12.0)

## 2021-02-17 MED ORDER — DEXAMETHASONE 4 MG PO TABS: ORAL_TABLET | ORAL | 1 refills | Status: DC

## 2021-02-17 MED ORDER — FOLIC ACID 1 MG PO TABS: 1.0000 mg | ORAL_TABLET | Freq: Every day | ORAL | 3 refills | Status: DC

## 2021-02-17 NOTE — Progress Notes (Signed)
I spoke with Mrs. Trickel and reviewed the instructions for the dexamethasone tablets which are to take 1 take at 8am and 2pm on Sunday, Tuesday and Wednesday this week.  She has picked up the dexamethasone, ondansetron and prochlorperazine rx, but still needs to pick up the folic acid.  She plans to pick that up on Saturday and begin taking.  I also recommended that she discontinue the ibuprofen tablets due to the interaction with pemetrexed.  She has been using this daily for pain, but I told her that if her pain is not well controlled without the ibuprofen, that she can discuss this with Dr. Bobby Rumpf and he can adjust her other pain meds to help with this.  I verified the infusion appt time with her for Monday.

## 2021-02-17 NOTE — Progress Notes (Signed)
The following Medication: Beryle Flock has been approved thru [employer] as Assistance Program. Enrollment period is 02/16/2021 to 02/16/2022.  Assistance ID: [id number]. Reason for Assistance: No Insurance First DOS:  03/13/2021

## 2021-02-17 NOTE — Progress Notes (Signed)
.  Pharmacist Chemotherapy Monitoring - Initial Assessment    Anticipated start date: 02/20/21    Regimen:  Are orders appropriate based on the patient's diagnosis, regimen, and cycle? Yes Does the plan date match the patient's scheduled date? Yes Is the sequencing of drugs appropriate? Yes Are the premedications appropriate for the patient's regimen? Yes Prior Authorization for treatment is: Uninsured If applicable, is the correct biosimilar selected based on the patient's insurance? not applicable  Organ Function and Labs: Are dose adjustments needed based on the patient's renal function, hepatic function, or hematologic function? Yes Are appropriate labs ordered prior to the start of patient's treatment? Yes Other organ system assessment, if indicated: women of childbearing potential: pregnancy status  The following baseline labs, if indicated, have been ordered: pembrolizumab: baseline TSH +/- T4  Dose Assessment: Are the drug doses appropriate? Yes Are the following correct: Drug concentrations Yes IV fluid compatible with drug Yes Administration routes Yes Timing of therapy Yes If applicable, does the patient have documented access for treatment and/or plans for port-a-cath placement? yes If applicable, have lifetime cumulative doses been properly documented and assessed? not applicable Lifetime Dose Tracking  No doses have been documented on this patient for the following tracked chemicals: Doxorubicin, Epirubicin, Idarubicin, Daunorubicin, Mitoxantrone, Bleomycin, Oxaliplatin, Carboplatin, Liposomal Doxorubicin    Toxicity Monitoring/Prevention: The patient has the following take home antiemetics prescribed: Ondansetron, Lorazapam, Prochlorperazine, and Dexamethasone The patient has the following take home medications prescribed: B12 for pemetrexed and folic acid for pemetrexed Medication allergies and previous infusion related reactions, if applicable, have been reviewed and  addressed. Yes The patient's current medication list has been assessed for drug-drug interactions with their chemotherapy regimen. 1 significant drug-drug interactions was identified on review.  Patient will be counseled to discontinue Ibuprofen while on Pemetrexed.  Order Review: Are the treatment plan orders signed? Yes Is the patient scheduled to see a provider prior to their treatment? Yes  I verify that I have reviewed each item in the above checklist and answered each question accordingly.  Juanetta Beets, RPH, 02/17/2021  12:57 PM

## 2021-02-20 ENCOUNTER — Inpatient Hospital Stay: Payer: Medicaid Other

## 2021-02-20 ENCOUNTER — Other Ambulatory Visit: Payer: Self-pay

## 2021-02-20 ENCOUNTER — Encounter: Payer: Self-pay | Admitting: Oncology

## 2021-02-20 ENCOUNTER — Ambulatory Visit: Payer: Self-pay

## 2021-02-20 ENCOUNTER — Other Ambulatory Visit: Payer: Self-pay | Admitting: Hematology and Oncology

## 2021-02-20 ENCOUNTER — Other Ambulatory Visit: Payer: Self-pay | Admitting: Pharmacist

## 2021-02-20 VITALS — BP 128/85 | HR 96 | Temp 98.3°F | Resp 18 | Ht 64.0 in | Wt 144.2 lb

## 2021-02-20 DIAGNOSIS — C3411 Malignant neoplasm of upper lobe, right bronchus or lung: Secondary | ICD-10-CM

## 2021-02-20 DIAGNOSIS — R0602 Shortness of breath: Secondary | ICD-10-CM

## 2021-02-20 DIAGNOSIS — R82998 Other abnormal findings in urine: Secondary | ICD-10-CM

## 2021-02-20 DIAGNOSIS — Z5111 Encounter for antineoplastic chemotherapy: Secondary | ICD-10-CM | POA: Diagnosis not present

## 2021-02-20 MED ORDER — SODIUM CHLORIDE 0.9 % IV SOLN
500.0000 mg/m2 | Freq: Once | INTRAVENOUS | Status: AC
  Administered 2021-02-20: 900 mg via INTRAVENOUS
  Filled 2021-02-20: qty 20

## 2021-02-20 MED ORDER — FOSAPREPITANT DIMEGLUMINE INJECTION 150 MG
150.0000 mg | Freq: Once | INTRAVENOUS | Status: AC
  Administered 2021-02-20: 150 mg via INTRAVENOUS
  Filled 2021-02-20: qty 150

## 2021-02-20 MED ORDER — SODIUM CHLORIDE 0.9 % IV SOLN
Freq: Once | INTRAVENOUS | Status: AC
  Filled 2021-02-20: qty 250

## 2021-02-20 MED ORDER — PALONOSETRON HCL INJECTION 0.25 MG/5ML
INTRAVENOUS | Status: AC
  Filled 2021-02-20: qty 5

## 2021-02-20 MED ORDER — DEXAMETHASONE SODIUM PHOSPHATE 100 MG/10ML IJ SOLN
10.0000 mg | Freq: Once | INTRAMUSCULAR | Status: AC
  Administered 2021-02-20: 10 mg via INTRAVENOUS
  Filled 2021-02-20: qty 10

## 2021-02-20 MED ORDER — CYANOCOBALAMIN 1000 MCG/ML IJ SOLN
INTRAMUSCULAR | Status: AC
  Filled 2021-02-20: qty 1

## 2021-02-20 MED ORDER — HEPARIN SOD (PORK) LOCK FLUSH 100 UNIT/ML IV SOLN
500.0000 [IU] | Freq: Once | INTRAVENOUS | Status: AC | PRN
  Administered 2021-02-20: 500 [IU]
  Filled 2021-02-20: qty 5

## 2021-02-20 MED ORDER — CYANOCOBALAMIN 1000 MCG/ML IJ SOLN
1000.0000 ug | Freq: Once | INTRAMUSCULAR | Status: AC
  Administered 2021-02-20: 1000 ug via INTRAMUSCULAR

## 2021-02-20 MED ORDER — PALONOSETRON HCL INJECTION 0.25 MG/5ML
0.2500 mg | Freq: Once | INTRAVENOUS | Status: AC
  Administered 2021-02-20: 0.25 mg via INTRAVENOUS

## 2021-02-20 MED ORDER — SODIUM CHLORIDE 0.9 % IV SOLN
580.0000 mg | Freq: Once | INTRAVENOUS | Status: AC
  Administered 2021-02-20: 580 mg via INTRAVENOUS
  Filled 2021-02-20: qty 58

## 2021-02-20 NOTE — Patient Instructions (Signed)
[facility]  Discharge Instructions: Thank you for choosing [facility] to provide your oncology and hematology care.  If you have a lab appointment with the [facility], please go directly to the [facility] and check in at the registration area.   Wear comfortable clothing and clothing appropriate for easy access to any Portacath or PICC line.   We strive to give you quality time with your provider. You may need to reschedule your appointment if you arrive late (15 or more minutes).  Arriving late affects you and other patients whose appointments are after yours.  Also, if you miss three or more appointments without notifying the office, you may be dismissed from the clinic at the provider's discretion.      For prescription refill requests, have your pharmacy contact our office and allow 72 hours for refills to be completed.    Today you received the following chemotherapy and/or immunotherapy agents Pemetrexed, Carboplatin, and Vitamin b12      To help prevent nausea and vomiting after your treatment, we encourage you to take your nausea medication as directed.  BELOW ARE SYMPTOMS THAT SHOULD BE REPORTED IMMEDIATELY: *FEVER GREATER THAN 100.4 F (38 C) OR HIGHER *CHILLS OR SWEATING *NAUSEA AND VOMITING THAT IS NOT CONTROLLED WITH YOUR NAUSEA MEDICATION *UNUSUAL SHORTNESS OF BREATH *UNUSUAL BRUISING OR BLEEDING *URINARY PROBLEMS (pain or burning when urinating, or frequent urination) *BOWEL PROBLEMS (unusual diarrhea, constipation, pain near the anus) TENDERNESS IN MOUTH AND THROAT WITH OR WITHOUT PRESENCE OF ULCERS (sore throat, sores in mouth, or a toothache) UNUSUAL RASH, SWELLING OR PAIN  UNUSUAL VAGINAL DISCHARGE OR ITCHING   Items with * indicate a potential emergency and should be followed up as soon as possible or go to the Emergency Department if any problems should occur.  Please show the CHEMOTHERAPY ALERT CARD or IMMUNOTHERAPY  ALERT CARD at check-in to the Emergency Department and triage nurse.  Should you have questions after your visit or need to cancel or reschedule your appointment, please contact [facility]  Dept: [phone]  and follow the prompts.  Office hours are 8:00 a.m. to 4:30 p.m. Monday - Friday. Please note that voicemails left after 4:00 p.m. may not be returned until the following business day.  We are closed weekends and major holidays. You have access to a nurse at all times for urgent questions. Please call the main number to the clinic Dept: [phone] and follow the prompts.  For any non-urgent questions, you may also contact your provider using MyChart. We now offer e-Visits for anyone 16 and older to request care online for non-urgent symptoms. For details visit [email or web address].   Also download the MyChart app! Go to the app store, search "MyChart", open the app, select [facility], and log in with your MyChart username and password.  Due to Covid, a mask is required upon entering the hospital/clinic. If you do not have a mask, one will be given to you upon arrival. For doctor visits, patients may have 1 support person aged 10 or older with them. For treatment visits, patients cannot have anyone with them due to current Covid guidelines and our immunocompromised population.

## 2021-02-21 ENCOUNTER — Other Ambulatory Visit: Payer: Self-pay | Admitting: Hematology and Oncology

## 2021-02-21 ENCOUNTER — Encounter: Payer: Self-pay | Admitting: Oncology

## 2021-02-21 ENCOUNTER — Other Ambulatory Visit (HOSPITAL_COMMUNITY): Payer: Self-pay

## 2021-02-21 MED ORDER — OXYCODONE HCL ER 15 MG PO T12A
15.0000 mg | EXTENDED_RELEASE_TABLET | Freq: Two times a day (BID) | ORAL | 0 refills | Status: DC
  Filled 2021-02-21: qty 60, 30d supply, fill #0

## 2021-02-21 MED ORDER — NICOTINE 21 MG/24HR TD PT24: 21.0000 mg | MEDICATED_PATCH | Freq: Every day | TRANSDERMAL | 0 refills | Status: DC

## 2021-02-21 MED ORDER — NITROFURANTOIN MONOHYD MACRO 100 MG PO CAPS: 100.0000 mg | ORAL_CAPSULE | Freq: Two times a day (BID) | ORAL | 0 refills | Status: DC

## 2021-02-21 NOTE — Progress Notes (Signed)
nicotine

## 2021-02-22 ENCOUNTER — Telehealth: Payer: Self-pay

## 2021-02-22 ENCOUNTER — Telehealth: Payer: Self-pay | Admitting: Hematology and Oncology

## 2021-02-22 ENCOUNTER — Encounter: Payer: Self-pay | Admitting: Oncology

## 2021-02-22 ENCOUNTER — Other Ambulatory Visit (HOSPITAL_COMMUNITY): Payer: Self-pay

## 2021-02-22 NOTE — Telephone Encounter (Signed)
I spoke with pt. She denies N/V, diarrhea, fevers, and skin irritations since infusion. She does report tiredness, which I told her was a common symptom of treatment. I reminded her of the importance in calling us [facility] if she develops temp over 100.4. She verbalized understanding.

## 2021-02-22 NOTE — Telephone Encounter (Signed)
02/22/21 spoke with patients mother about coming in for chest xray.

## 2021-02-23 ENCOUNTER — Telehealth: Payer: Self-pay

## 2021-02-23 ENCOUNTER — Other Ambulatory Visit: Payer: Self-pay

## 2021-02-23 NOTE — Telephone Encounter (Addendum)
Pt's mom, notified of Kelli's response below. Malachy Mood states that Alera is still SOB. I encouraged her to take Ricci to [facility] in the morning s soon as possible to get the crxay done. She said she will tell [name]-  ---- Message from Marvia Pickles, PA-C sent at 02/23/2021  2:31 PM EDT ----- Regarding: RE: xray Contact: [phone]  It is up to her, I ordered the chest x-ray because she reported increasing dyspnea to Manuela Schwartz in infusion.  I have never seen her. ----- Message ----- From: Dairl Ponder, RN Sent: 02/23/2021   2:29 PM EDT To: Marvia Pickles, PA-C Subject: xray                                           Pt's mom, Malachy Mood, called to report she doesn't think she can get her daughter up here for a cxray today. "She doesn't stay awake long enough to do anything. We wake her to eat, & she nods off before the food gets to her".  I asked if she was staying in the bed all day? She replied, "No, she gets up to the recliner, but falls back asleep". Her mom states she has been like this since her chemotherapy.  I asked how much pain medication is she taking? Pt answered (in the background), "I'm taking 4-5 oxycodone 10mg  tabs per day. I was told not to use ibuprofen". I asked pt if she was taking the OxyContin BID? She replied, "No, they wanted $400 for it".

## 2021-02-24 ENCOUNTER — Other Ambulatory Visit: Payer: Self-pay | Admitting: Hematology and Oncology

## 2021-02-24 ENCOUNTER — Telehealth: Payer: Self-pay

## 2021-02-24 ENCOUNTER — Inpatient Hospital Stay: Payer: Medicaid Other | Admitting: Oncology

## 2021-02-24 ENCOUNTER — Other Ambulatory Visit (HOSPITAL_COMMUNITY): Payer: Self-pay

## 2021-02-24 ENCOUNTER — Other Ambulatory Visit: Payer: Self-pay

## 2021-02-24 ENCOUNTER — Encounter: Payer: Self-pay | Admitting: Hematology and Oncology

## 2021-02-24 ENCOUNTER — Inpatient Hospital Stay: Payer: Medicaid Other

## 2021-02-24 ENCOUNTER — Inpatient Hospital Stay (INDEPENDENT_AMBULATORY_CARE_PROVIDER_SITE_OTHER): Payer: Medicaid Other | Admitting: Hematology and Oncology

## 2021-02-24 VITALS — BP 142/73 | HR 105 | Temp 98.4°F | Resp 20 | Ht 64.0 in | Wt 142.4 lb

## 2021-02-24 DIAGNOSIS — C3411 Malignant neoplasm of upper lobe, right bronchus or lung: Secondary | ICD-10-CM

## 2021-02-24 DIAGNOSIS — R509 Fever, unspecified: Secondary | ICD-10-CM

## 2021-02-24 LAB — HEPATIC FUNCTION PANEL
ALT: 23 (ref 7–35)
AST: 29 (ref 13–35)
Alkaline Phosphatase: 64 (ref 25–125)
Bilirubin, Total: 0.9

## 2021-02-24 LAB — BASIC METABOLIC PANEL
BUN: 11 (ref 4–21)
CO2: 24 — AB (ref 13–22)
Chloride: 101 (ref 99–108)
Creatinine: 0.5 (ref 0.5–1.1)
Glucose: 157
Potassium: 3.8 (ref 3.4–5.3)
Sodium: 133 — AB (ref 137–147)

## 2021-02-24 LAB — CBC AND DIFFERENTIAL
HCT: 40 (ref 36–46)
Hemoglobin: 14.3 (ref 12.0–16.0)
Neutrophils Absolute: 4.84
Platelets: 183 (ref 150–399)
WBC: 5.9

## 2021-02-24 LAB — COMPREHENSIVE METABOLIC PANEL
Albumin: 4 (ref 3.5–5.0)
Calcium: 8.5 — AB (ref 8.7–10.7)

## 2021-02-24 LAB — CBC: RBC: 4.38 (ref 3.87–5.11)

## 2021-02-24 MED ORDER — OXYCODONE HCL 10 MG PO TABS: 10.0000 mg | ORAL_TABLET | ORAL | 0 refills | Status: DC | PRN

## 2021-02-24 MED ORDER — LEVOFLOXACIN 750 MG PO TABS: 750.0000 mg | ORAL_TABLET | Freq: Every day | ORAL | 0 refills | Status: DC

## 2021-02-24 NOTE — Telephone Encounter (Addendum)
Pt coming in for cxray (ordered yesterday) in [facility] within 30 min, then will come down here for labs and office visit.  ----- Message from Melodye Ped, NP sent at 02/24/2021  9:55 AM EDT ----- Regarding: RE: temp 100.6 last evening, incision site looks infected per pt, missed 2 days steroids post chemo Contact: [phone] We will see in clinic and obtain CXR, blood/ urine cultures and CBC, CMP.   ----- Message ----- From: Dairl Ponder, RN Sent: 02/24/2021   9:41 AM EDT To: Melodye Ped, NP Subject: temp 100.6 last evening, incision site looks#  Pt LVM on triage line reporting she had temp of 100.6 last evening. I phoned her back to get additional information. Pt reports, "the incision on my back, where they removed fluid, looks red and infected". Pt admits to occasional non productive cough, but cough much better. No SOB. Denies UTI s/s. She also mentions she didn't realize she was supposed to take the dexamethasone for 3 days after chemo (so she has missed the first 2 days).

## 2021-02-27 ENCOUNTER — Encounter: Payer: Self-pay | Admitting: Genetic Counselor

## 2021-02-27 ENCOUNTER — Other Ambulatory Visit: Payer: Self-pay | Admitting: Hematology and Oncology

## 2021-02-27 ENCOUNTER — Ambulatory Visit: Payer: Self-pay

## 2021-02-27 ENCOUNTER — Encounter: Payer: Self-pay | Admitting: Oncology

## 2021-02-27 MED ORDER — OXYCODONE HCL 10 MG PO TABS: 10.0000 mg | ORAL_TABLET | ORAL | 0 refills | Status: DC | PRN

## 2021-02-28 ENCOUNTER — Encounter: Payer: Self-pay | Admitting: Hematology and Oncology

## 2021-03-02 ENCOUNTER — Telehealth: Payer: Self-pay

## 2021-03-02 NOTE — Progress Notes (Signed)
Clinic Day:  03/09/2021   Referring physician: No ref. provider found   This document serves as a record of services personally performed by Marice Potter, MD. It was created on their behalf by Curry,Lauren E, a trained medical scribe. The creation of this record is based on the scribe's personal observations and the provider's statements to them.   [facility]  [address] [place],  [place]  [address] [phone]  Clinic Day:  03/14/2021  Referring physician: No ref. provider found  HISTORY OF PRESENT ILLNESS:  The patient is a 45 y.o. female with stage IVA (T4 N2 M1a) lung adenocarcinoma.  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  She has no evidence of metastatic disease.  The patient comes in today be evaluated before heading into her 2nd cycle of carboplatin/pemetrexed/pembrolizumab.  Of note, she did not receive pembrolizumab with her 1st cycle of treatment as it could not get covered for compassionate use by the time her 1st cycle of treatment was scheduled.  The patient claims her 1st cycle of treatment had her weak for the first 2 weeks.  Since then, she has gradually improved.  Of note, she reports not coughing as much and being as dyspneic as she was before her 1st cycle of treatment commenced.  She also comes in today to go over the [place] results from her recent liquid biopsy.     PHYSICAL EXAM:  Blood pressure (!) 143/84, pulse 93, temperature 98.5 F (36.9 C), resp. rate 16, height _0  (1.626 m), weight 144 lb 1.6 oz (65.4 kg), SpO2 96 %. Wt Readings from Last 3 Encounters:  03/09/21 144 lb 1.6 oz (65.4 kg)  02/24/21 142 lb 6.4 oz (64.6 kg)  02/20/21 144 lb 4 oz (65.4 kg)   Body mass index is 24.73 kg/m. Performance status (ECOG): 1 - Symptomatic but completely ambulatory Physical Exam Constitutional:      Appearance: Normal appearance. She is not ill-appearing.  HENT:      Mouth/Throat:     Mouth: Mucous membranes are moist.     Pharynx: Oropharynx is clear. No oropharyngeal exudate or posterior oropharyngeal erythema.  Cardiovascular:     Rate and Rhythm: Normal rate and regular rhythm.     Heart sounds: No murmur heard.   No friction rub. No gallop.  Pulmonary:     Effort: Pulmonary effort is normal. No respiratory distress.     Breath sounds: Normal breath sounds. No wheezing, rhonchi or rales.  Chest:  Breasts:    Right: No axillary adenopathy or supraclavicular adenopathy.     Left: No axillary adenopathy or supraclavicular adenopathy.  Abdominal:     General: Bowel sounds are normal. There is no distension.     Palpations: Abdomen is soft. There is no mass.     Tenderness: There is no abdominal tenderness.  Musculoskeletal:        General: No swelling.     Right lower leg: No edema.     Left lower leg: No edema.  Lymphadenopathy:     Cervical: No cervical adenopathy.     Upper Body:     Right upper body: No supraclavicular or axillary adenopathy.     Left upper body: No supraclavicular or axillary adenopathy.     Lower Body: No right inguinal adenopathy. No left inguinal adenopathy.  Skin:    General: Skin is warm.     Coloration: Skin is not jaundiced.  Findings: No lesion or rash.  Neurological:     General: No focal deficit present.     Mental Status: She is alert and oriented to person, place, and time. Mental status is at baseline.     Cranial Nerves: Cranial nerves are intact.  Psychiatric:        Mood and Affect: Mood normal.        Behavior: Behavior normal.        Thought Content: Thought content normal.  LABS:   CBC Latest Ref Rng & Units 03/09/2021 02/24/2021 02/03/2021  WBC - 5.0 5.9 8.9  Hemoglobin 12.0 - 16.0 13.4 14.3 15.7  Hematocrit 36 - 46 38 40 45  Platelets 150 - 399 268 183 301   CMP Latest Ref Rng & Units 03/09/2021 02/24/2021 02/03/2021  BUN 4 - _0 Creatinine 0.5 - 1.1 0.6 0.5 0.6  Sodium 137 - 147 135(A)  133(A) 134(A)  Potassium 3.4 - 5.3 4.0 3.8 3.9  Chloride 99 - 108 102 101 101  CO2 13 - 22 23(A) 24(A) 26(A)  Calcium 8.7 - 10.7 9.1 8.5(A) 8.7  Alkaline Phos 25 - 125 73 64 77  AST 13 - 35 44(A) 29 29  ALT 7 - 35 59(A) 23 25   STUDIES:  Guardant360 test results show that she harbors the EGFR [id number] (Exon 19 deletion) mutation.  ASSESSMENT & PLAN:  Assessment/Plan:  A 45 y.o. 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   The patient understands that, based upon having the EGFR mutation, she can be switched to osimertinib (Tagrisso), which is an oral targeted agent that hones in on the mutation in question.  She will start taking the pill as soon as we can get it covered for her.  Although she is being switched from chemotherapy, I definitely believe she derived a benefit from her lone cycle of carboplatin/pemetrexed.  In clinic today, she was speaking in full sentences, expressed no signs of dyspnea, and was not coughing; this was the exact opposite of how she was before her chemotherapy commenced.  Hopefully, we can get her osimertinib sent to her as quickly as possible so her palliative lung cancer management can continue.  Otherwise, I will see her back in 6 months for repeat clinical assessment.  The patient understands all the plans discussed today and is in agreement with them.      Dequincy Macarthur Critchley, MD

## 2021-03-04 ENCOUNTER — Encounter: Payer: Self-pay | Admitting: Oncology

## 2021-03-04 NOTE — Progress Notes (Signed)
  [facility]  [address] [place],  [place]  [address] [phone]  Clinic Day:  03/04/2021  Referring physician: No ref. provider found   HISTORY OF PRESENT ILLNESS:  The patient is a 45 y.o. female with stage IIIB (T4 N2 M0) lung adenocarcinoma. She comes in today for symptom management including fever and skin infection at thoracentesis site. She reports fever last night 100.6 with redness and pain at thoracentesis site. She also notes headache and overall achiness. She states she forgot to take her oral steroids prescribed around her chemotherapy dosing she received this week. She is afebrile in clinic today. She does have a 4x4 red, irritated skin abrasion most likely associated with the tape from her recent thoracentesis procedure. CBC and CMP is unremarkable today.  PHYSICAL EXAM:  Blood pressure (!) 142/73, pulse (!) 105, temperature 98.4 F (36.9 C), temperature source Oral, resp. rate 20, height 5\' 4"  (1.626 m), weight 142 lb 6.4 oz (64.6 kg), SpO2 94 %. Wt Readings from Last 3 Encounters:  02/24/21 142 lb 6.4 oz (64.6 kg)  02/20/21 144 lb 4 oz (65.4 kg)  02/15/21 146 lb (66.2 kg)   Body mass index is 24.44 kg/m.   LABS:   CBC Latest Ref Rng & Units 02/24/2021 02/03/2021  WBC - 5.9 8.9  Hemoglobin 12.0 - 16.0 14.3 15.7  Hematocrit 36 - 46 40 45  Platelets 150 - 399 183 301   CMP Latest Ref Rng & Units 02/24/2021 02/03/2021  BUN 4 - 21 11 6   Creatinine 0.5 - 1.1 0.5 0.6  Sodium 137 - 147 133(A) 134(A)  Potassium 3.4 - 5.3 3.8 3.9  Chloride 99 - 108 101 101  CO2 13 - 22 24(A) 26(A)  Calcium 8.7 - 10.7 8.5(A) 8.7  Alkaline Phos 25 - 125 64 77  AST 13 - 35 29 29  ALT 7 - 35 23 25   STUDIES:      ASSESSMENT & PLAN:  Assessment/Plan:  A 45 y.o. female who unfortunately appears to have stage IVA (T4 N2 M1a) lung adenocarcinoma.  She presents today with a skin abrasion to the thoracentesis site that is painful. She has also been  febrile. We will start her on antibiotics and she will keep scheduled follow up appointments with Dr. Bobby Rumpf.    Melodye Ped, NP

## 2021-03-07 ENCOUNTER — Ambulatory Visit: Payer: Self-pay

## 2021-03-09 ENCOUNTER — Other Ambulatory Visit: Payer: Self-pay

## 2021-03-09 ENCOUNTER — Inpatient Hospital Stay: Payer: Medicaid Other | Attending: Oncology

## 2021-03-09 ENCOUNTER — Other Ambulatory Visit: Payer: Self-pay | Admitting: Hematology and Oncology

## 2021-03-09 ENCOUNTER — Other Ambulatory Visit: Payer: Self-pay | Admitting: Oncology

## 2021-03-09 ENCOUNTER — Inpatient Hospital Stay (INDEPENDENT_AMBULATORY_CARE_PROVIDER_SITE_OTHER): Payer: Medicaid Other | Admitting: Oncology

## 2021-03-09 VITALS — BP 143/84 | HR 93 | Temp 98.5°F | Resp 16 | Ht 64.0 in | Wt 144.1 lb

## 2021-03-09 DIAGNOSIS — C3411 Malignant neoplasm of upper lobe, right bronchus or lung: Secondary | ICD-10-CM

## 2021-03-09 LAB — CBC AND DIFFERENTIAL
HCT: 38 (ref 36–46)
Hemoglobin: 13.4 (ref 12.0–16.0)
Neutrophils Absolute: 2.45
Platelets: 268 (ref 150–399)
WBC: 5

## 2021-03-09 LAB — HEPATIC FUNCTION PANEL
ALT: 59 — AB (ref 7–35)
AST: 44 — AB (ref 13–35)
Alkaline Phosphatase: 73 (ref 25–125)
Bilirubin, Total: 0.3

## 2021-03-09 LAB — BASIC METABOLIC PANEL
BUN: 8 (ref 4–21)
CO2: 23 — AB (ref 13–22)
Chloride: 102 (ref 99–108)
Creatinine: 0.6 (ref 0.5–1.1)
Glucose: 90
Potassium: 4 (ref 3.4–5.3)
Sodium: 135 — AB (ref 137–147)

## 2021-03-09 LAB — TSH: TSH: 0.765 u[IU]/mL (ref 0.350–4.500)

## 2021-03-09 LAB — CBC: RBC: 4.13 (ref 3.87–5.11)

## 2021-03-09 LAB — COMPREHENSIVE METABOLIC PANEL
Albumin: 4 (ref 3.5–5.0)
Calcium: 9.1 (ref 8.7–10.7)

## 2021-03-10 LAB — T4: T4, Total: 7.9 ug/dL (ref 4.5–12.0)

## 2021-03-10 NOTE — Telephone Encounter (Signed)
Pt in to see Dr Bobby Rumpf on 03/09/21, and was noted to have tolerated the last cycle of chemotherapy pretty well. No lingering effects as of now.

## 2021-03-13 ENCOUNTER — Inpatient Hospital Stay: Payer: Medicaid Other

## 2021-03-13 NOTE — Progress Notes (Signed)
Patient is going to be changing treatment to [place]. I reached out to her on 07/08 and 07/11, she needs to sign papers through [employer] for free medication. I had to leave a voicemail on both days asking her to return my call.

## 2021-03-16 NOTE — Progress Notes (Signed)
Faxed in application to [employer] for free Tagrisso, patient was in to sign today.

## 2021-03-17 NOTE — Progress Notes (Signed)
Patient was approved for free Tagrisso through [employer] from 03/17/2021-06/15/2021. She will need to apply and be denied Medicaid before they will approve her through the end of the year. They are going to call her to go over all the information and set up first shipment from [employer]. I tried to reach patient and had to leave her a voicemail. If she has not heard from anyone at the program she can call [phone].

## 2021-03-21 ENCOUNTER — Telehealth: Payer: Self-pay

## 2021-03-21 NOTE — Telephone Encounter (Signed)
Pt called to ask if the cancer could be causing heel pain in her left foot. Pt states she has had the heel pain for about 6 days. I told her , "No" , & I encouraged pt to see her PCP for assessment.

## 2021-03-28 ENCOUNTER — Inpatient Hospital Stay: Payer: Medicaid Other | Admitting: Hematology and Oncology

## 2021-03-30 ENCOUNTER — Telehealth (HOSPITAL_BASED_OUTPATIENT_CLINIC_OR_DEPARTMENT_OTHER): Payer: Self-pay | Admitting: Hematology and Oncology

## 2021-03-30 NOTE — Progress Notes (Signed)
I connected with  Andrea Holloway on 03/30/21 by a telemedicine application and verified that I am speaking with the correct person using two identifiers.   I discussed the limitations of evaluation and management by telemedicine. The patient expressed understanding and agreed to proceed.    The patient is a 45 year old with lung cancer.  Patient presents to clinic today for chemotherapy education and palliative care consult.  We will start Tagrisso  We will send in prescriptions for prochlorperazine and ondansetron.  The patient verbalizes understanding of and agreement to the plan as discussed today.  Provided general information including the following: 1.  Date of education: 03/29/2021 2.  Physician name: Dr. Bobby Rumpf 3.  Diagnosis: Lung Cancer 4.  Stage: Stage IVA 5.  Palliative 6.  Chemotherapy plan including drugs and how often: Tagrisso 7.  Start date: 03/30/2021 8.  Other referrals: None at this time 9.  The patient is to call our office with any questions or concerns.  Our office number [phone], if after hours or on the weekend, call the same number and wait for the answering service.  There is always an oncologist on call 10.  Medications prescribed: 11.  The patient has verbalized understanding of the treatment plan and has no barriers to adherence or understanding.  Obtained signed consent from patient.  Discussed symptoms including 1.  Low blood counts including red blood cells, white blood cells and platelets. 2. Infection including to avoid large crowds, wash hands frequently, and stay away from people who were sick.  If fever develops of 100.4 or higher, call our office. 3.  Mucositis-given instructions on mouth rinse (baking soda and salt mixture).  Keep mouth clean.  Use soft bristle toothbrush.  If mouth sores develop, call our clinic. 4.  Nausea/vomiting-gave prescriptions for ondansetron 4 mg every 4 hours as needed for nausea, may take around the clock if persistent.   Compazine 10 mg every 6 hours, may take around the clock if persistent. 5.  Diarrhea-use over-the-counter Imodium.  Call clinic if not controlled. 6.  Constipation-use senna, 1 to 2 tablets twice a day.  If no BM in 2 to 3 days call the clinic. 7.  Loss of appetite-try to eat small meals every 2-3 hours.  Call clinic if not eating. 8.  Taste changes-zinc 500 mg daily.  If becomes severe call clinic. 9.  Alcoholic beverages. 10.  Drink 2 to 3 quarts of water per day. 11.  Peripheral neuropathy-patient to call if numbness or tingling in hands or feet is persistent   Gave information on the supportive care team and how to contact them regarding services.  Discussed advanced directives.  The patient does not have their advanced directives but will look at the copy provided in their notebook and will call with any questions. [employer] Social worker Advanced directives  Answered questions to patient satisfaction.  Patient is to call with any further questions or concerns.  Time spent on this palliative care/chemotherapy education was 60 minutes with more than 50% spent discussing diagnosis, prognosis and symptom management.  The medication prescribed to the patient will be printed out from [email or web address] This will give the following information: Name of your medication Approved uses Dose and schedule Storage and handling Handling body fluids and waste Drug and food interactions Possible side effects and management Pregnancy, sexual activity, and contraception Obtaining medication    Patient: Home Provider: Office Time on telephone visit: 15 minutes

## 2021-04-05 ENCOUNTER — Other Ambulatory Visit: Payer: Self-pay | Admitting: Hematology and Oncology

## 2021-04-05 DIAGNOSIS — C3411 Malignant neoplasm of upper lobe, right bronchus or lung: Secondary | ICD-10-CM

## 2021-04-06 ENCOUNTER — Telehealth: Payer: Self-pay

## 2021-04-06 ENCOUNTER — Other Ambulatory Visit (HOSPITAL_COMMUNITY): Payer: Self-pay

## 2021-04-06 NOTE — Telephone Encounter (Addendum)
Pt returned my call. She is taking the Tagrisso @ 8pm nightly with glass of tea (she normally eats @ 6pm). She denies missed doses. I told her if she were to miss a dose, to just skip it and take the next scheduled dose. She had 1 episode of emesis and states she isn't sure if the Tagrisso came back up or not. I told her if that happens, do not take another tablet. She verbalized understanding. I encouraged her to take a Zofran or compazine 30-45 minutes before taking Tagrisso to hopefully prevent nausea. Pt denies skin rash, fevers, eye irritation, mouth sores, headaches, & nail changes. She does admit to feeling sweaty @ times, similar to hot flashes. I reminded pt of the importance of calling us if temp develops of 100.4 or higher, DAY OR NIGHT. She verbalized understanding. I confirmed next appt for 04/12/21.   04/06/21 @ 1439- I attempted call to pt to see how she is tolerating the [place]. No answer.

## 2021-04-07 ENCOUNTER — Telehealth: Payer: Self-pay

## 2021-04-07 NOTE — Telephone Encounter (Signed)
@   1453- I LVM on identified answering machine notifying pt of below.   RE: New rash on lower bilateral legs Received: Today Melodye Ped, NP  Dairl Ponder, RN Phone Number:[phone]   I think as long as they aren't increasing or causing her any issue we should be ok. If they get worse, she should call us. She has an appointment with me next week, I think

## 2021-04-07 NOTE — Telephone Encounter (Signed)
@  1023- Pt returned my call. She reports, "my daughter noticed the rash last night when I got out of shower. When I got up this morning, there were a few more". The little red dots kind of look like ant bites, but pt states she hasn't been bitten by any ants. Areas are not raised, & are not itching. Pt is taking Tagrisso 80mg  po qd - started on 03/10/21.   @1008 - I attempted call to pt to get a little more description regarding rash, such as if dots are raised? Itching? Only on legs? - No answer.

## 2021-04-12 ENCOUNTER — Encounter: Payer: Self-pay | Admitting: Hematology and Oncology

## 2021-04-12 ENCOUNTER — Other Ambulatory Visit: Payer: Self-pay

## 2021-04-12 ENCOUNTER — Inpatient Hospital Stay: Payer: Medicaid Other | Attending: Hematology and Oncology | Admitting: Hematology and Oncology

## 2021-04-12 ENCOUNTER — Inpatient Hospital Stay: Payer: Medicaid Other

## 2021-04-12 ENCOUNTER — Other Ambulatory Visit: Payer: Self-pay | Admitting: Hematology and Oncology

## 2021-04-12 DIAGNOSIS — C3411 Malignant neoplasm of upper lobe, right bronchus or lung: Secondary | ICD-10-CM

## 2021-04-12 DIAGNOSIS — Z9221 Personal history of antineoplastic chemotherapy: Secondary | ICD-10-CM | POA: Insufficient documentation

## 2021-04-12 DIAGNOSIS — C3490 Malignant neoplasm of unspecified part of unspecified bronchus or lung: Secondary | ICD-10-CM | POA: Insufficient documentation

## 2021-04-12 LAB — CBC AND DIFFERENTIAL
HCT: 43 (ref 36–46)
Hemoglobin: 14.8 (ref 12.0–16.0)
Neutrophils Absolute: 3.39
Platelets: 131 — AB (ref 150–399)
WBC: 5.3

## 2021-04-12 LAB — BASIC METABOLIC PANEL
BUN: 7 (ref 4–21)
CO2: 28 — AB (ref 13–22)
Chloride: 101 (ref 99–108)
Creatinine: 0.8 (ref 0.5–1.1)
Glucose: 85
Potassium: 4.1 (ref 3.4–5.3)
Sodium: 136 — AB (ref 137–147)

## 2021-04-12 LAB — CBC: RBC: 4.57 (ref 3.87–5.11)

## 2021-04-12 LAB — COMPREHENSIVE METABOLIC PANEL
Albumin: 4.1 (ref 3.5–5.0)
Calcium: 8.8 (ref 8.7–10.7)

## 2021-04-12 LAB — HEPATIC FUNCTION PANEL
ALT: 16 (ref 7–35)
AST: 27 (ref 13–35)
Alkaline Phosphatase: 68 (ref 25–125)
Bilirubin, Total: 0.5

## 2021-04-12 MED ORDER — OXYCODONE HCL 10 MG PO TABS: 10.0000 mg | ORAL_TABLET | ORAL | 0 refills | Status: DC | PRN

## 2021-04-12 NOTE — Progress Notes (Signed)
[facility]  [address] [place],  [place]  [address] [phone]  Clinic Day:  04/20/2021  Referring physician: No ref. provider found  HISTORY OF PRESENT ILLNESS:  The patient is a 45 y.o. female with stage IVA (T4 N2 M1a) lung adenocarcinoma.  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  She has no evidence of metastatic disease.  The patient comes in today be evaluated before heading into her 2nd cycle of carboplatin/pemetrexed/pembrolizumab.  Of note, she did not receive pembrolizumab with her 1st cycle of treatment as it could not get covered for compassionate use by the time her 1st cycle of treatment was scheduled.  The patient claims her 1st cycle of treatment had her weak for the first 2 weeks.  Since then, she has gradually improved.  Of note, she reports not coughing as much and being as dyspneic as she was before her 1st cycle of treatment commenced.    She presents to clinic today for evaluation after being on [place] for 2 weeks. She states she is tolerating medication well. She has a slight rash noted to both legs. It does not itch or burn. She denies fever, chills, nausea or vomiting. She denies any increases to her shortness of breath or cough. She denies chest pain. She denies issue with bowel or bladder. CBC and CMP are unremarkable.    PHYSICAL EXAM:  There were no vitals taken for this visit. Wt Readings from Last 3 Encounters:  03/09/21 144 lb 1.6 oz (65.4 kg)  02/24/21 142 lb 6.4 oz (64.6 kg)  02/20/21 144 lb 4 oz (65.4 kg)   There is no height or weight on file to calculate BMI. Performance status (ECOG): 1 - Symptomatic but completely ambulatory Physical Exam Constitutional:      Appearance: Normal appearance. She is not ill-appearing.  HENT:     Mouth/Throat:     Mouth: Mucous membranes are moist.     Pharynx: Oropharynx is clear. No oropharyngeal exudate or posterior oropharyngeal  erythema.  Cardiovascular:     Rate and Rhythm: Normal rate and regular rhythm.     Heart sounds: No murmur heard.   No friction rub. No gallop.  Pulmonary:     Effort: Pulmonary effort is normal. No respiratory distress.     Breath sounds: Normal breath sounds. No wheezing, rhonchi or rales.  Abdominal:     General: Bowel sounds are normal. There is no distension.     Palpations: Abdomen is soft. There is no mass.     Tenderness: There is no abdominal tenderness.  Musculoskeletal:        General: No swelling.     Right lower leg: No edema.     Left lower leg: No edema.  Lymphadenopathy:     Cervical: No cervical adenopathy.     Upper Body:     Right upper body: No supraclavicular or axillary adenopathy.     Left upper body: No supraclavicular or axillary adenopathy.     Lower Body: No right inguinal adenopathy. No left inguinal adenopathy.  Skin:    General: Skin is warm.     Coloration: Skin is not jaundiced.     Findings: No lesion or rash.  Neurological:     General: No focal deficit present.     Mental Status: She is alert and oriented to person, place, and time. Mental status is at baseline.     Cranial Nerves:  Cranial nerves are intact.  Psychiatric:        Mood and Affect: Mood normal.        Behavior: Behavior normal.        Thought Content: Thought content normal.    CBC Latest Ref Rng & Units 04/12/2021 03/09/2021 02/24/2021  WBC - 5.3 5.0 5.9  Hemoglobin 12.0 - 16.0 14.8 13.4 14.3  Hematocrit 36 - 46 43 38 40  Platelets 150 - 399 131(A) 268 183   CMP Latest Ref Rng & Units 04/12/2021 03/09/2021 02/24/2021  BUN 4 - 21 7 8 11   Creatinine 0.5 - 1.1 0.8 0.6 0.5  Sodium 137 - 147 136(A) 135(A) 133(A)  Potassium 3.4 - 5.3 4.1 4.0 3.8  Chloride 99 - 108 101 102 101  CO2 13 - 22 28(A) 23(A) 24(A)  Calcium 8.7 - 10.7 8.8 9.1 8.5(A)  Alkaline Phos 25 - 125 68 73 64  AST 13 - 35 27 44(A) 29  ALT 7 - 35 16 59(A) 23   STUDIES:  Guardant360 test results show that she  harbors the EGFR [id number] (Exon 19 deletion) mutation.  ASSESSMENT & PLAN:  Assessment/Plan:  A 45 y.o. 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   She is tolerating treatment well. We will continue to monitor mild rash to both lower extremities. She knows to let us know if this worsens. She will continue with scheduled follow up with Dr. Bobby Rumpf  She verbalizes understanding of and agreement to the plans discussed today. She knows to call the office should any new questions or concerns arise.   Melodye Ped, NP

## 2021-04-19 ENCOUNTER — Other Ambulatory Visit: Payer: Self-pay | Admitting: Hematology and Oncology

## 2021-04-20 ENCOUNTER — Telehealth: Payer: Self-pay

## 2021-04-20 DIAGNOSIS — R079 Chest pain, unspecified: Secondary | ICD-10-CM

## 2021-04-20 DIAGNOSIS — C3411 Malignant neoplasm of upper lobe, right bronchus or lung: Secondary | ICD-10-CM

## 2021-04-20 DIAGNOSIS — G8929 Other chronic pain: Secondary | ICD-10-CM

## 2021-04-20 NOTE — Telephone Encounter (Addendum)
  Pt notified of below. She will stop Tagrisso now. I told her eRx sent I for sleep and pain. Appts given for Mon, 8/22 @ 930a labs, 10a provider. She verbalized understanding.   Message Received: Today Melodye Ped, NP  Dairl Ponder, RN Caller: Unspecified Wilburn Mylar,  2:31 PM) Please stop Tagrisso, I will see on Monday. She can have ambien 10 mg for insomnia. If symptoms worsen, go to ED. Kathreen Cosier can occur with the medicine.        Per Melissa,NP, - I don't know that it is related. I don't care to send her in something.   04/21/21- I spoke with pt. She is taking the Tagrisso @ 8pm nightly with a snack. She has intermittent nausea/vomiting. I encouraged her to take a nausea medication 30-45 minutes before taking the Tagrisso. She states she has asked for something stronger for nausea. Denies missed doses. No fevers, SOB, vision changes, & headaches. She does feel "bumps like acne on inside of cheeks". No redness or white @ those areas. Her mouth does burn @ times. He continues to have episodes of feeling hot, then cold. She states, "I don't know if it is menopause or what". Her feet, legs, hips hurt when ambulating. "The oxycodone doesn't help that at all. I don't know if that is nerve pain". She is having skin irritation on her face, resembling acne on her chin and left side cheek - they do itch. She is having terrible insomnia, this is new, since starting [place]. She tries to nap during the day (because she definitely doesn't sleep at night) but she has a hard time.    04/20/21 - I have attempted call to pt to see how she is tolerating the [place].

## 2021-04-20 NOTE — Progress Notes (Signed)
[facility]  [address] [place],  [place]  [address] [phone]  Clinic Day:  04/27/2021  Referring physician: No ref. provider found  This document serves as a record of services personally performed by Marice Potter, MD. It was created on their behalf by Curry,Lauren E, a trained medical scribe. The creation of this record is based on the scribe's personal observations and the provider's statements to them.  HISTORY OF PRESENT ILLNESS:  The patient is a 45 y.o. female with stage IVA (T4 N2 M1a) lung adenocarcinoma.  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  She has no evidence of metastatic disease.  As testing showed her to harbor the EGFR mutation, she has been taking osimertinib for the past 3-4 weeks.  The patient comes in today for routine follow-up.  Earlier this week, the patient developed a rash on her lower legs, for which she initially thought was due to her osimertinib.  However, she now believes this rash was due to chigger bites as her son also had the same problem.  The previous acne on her face has also dissipated, all of which while she has been back on her osimertinib.  She also had mouth soreness, but this has dissipated since she switched toothpaste brands.  Overall, the patient claims to be doing fine.  She does not have any respiratory issues, such as shortness of breath or chest wall pain, which concern her for disease progression while on osimertinib.    PHYSICAL EXAM:  Blood pressure 125/68, pulse 97, temperature 98.3 F (36.8 C), resp. rate 16, height _0  (1.626 m), weight 144 lb (65.3 kg), SpO2 97 %. Wt Readings from Last 3 Encounters:  04/27/21 144 lb (65.3 kg)  04/24/21 143 lb 4.8 oz (65 kg)  03/09/21 144 lb 1.6 oz (65.4 kg)   Body mass index is 24.72 kg/m. Performance status (ECOG): 1 - Symptomatic but completely ambulatory Physical Exam Constitutional:      Appearance:  Normal appearance. She is not ill-appearing.  HENT:     Mouth/Throat:     Mouth: Mucous membranes are moist.     Pharynx: Oropharynx is clear. No oropharyngeal exudate or posterior oropharyngeal erythema.  Cardiovascular:     Rate and Rhythm: Normal rate and regular rhythm.     Heart sounds: No murmur heard.   No friction rub. No gallop.  Pulmonary:     Effort: Pulmonary effort is normal. No respiratory distress.     Breath sounds: Normal breath sounds. No wheezing, rhonchi or rales.  Abdominal:     General: Bowel sounds are normal. There is no distension.     Palpations: Abdomen is soft. There is no mass.     Tenderness: There is no abdominal tenderness.  Musculoskeletal:        General: No swelling.     Right lower leg: No edema.     Left lower leg: No edema.  Lymphadenopathy:     Cervical: No cervical adenopathy.     Upper Body:     Right upper body: No supraclavicular or axillary adenopathy.     Left upper body: No supraclavicular or axillary adenopathy.     Lower Body: No right inguinal adenopathy. No left inguinal adenopathy.  Skin:    General: Skin is warm.     Coloration: Skin is not jaundiced.     Findings: No lesion or rash.  Neurological:  General: No focal deficit present.     Mental Status: She is alert and oriented to person, place, and time. Mental status is at baseline.     Cranial Nerves: Cranial nerves are intact.  Psychiatric:        Mood and Affect: Mood normal.        Behavior: Behavior normal.        Thought Content: Thought content normal.    CBC Latest Ref Rng & Units 04/27/2021 04/24/2021 04/12/2021  WBC - 4.4 4.6 5.3  Hemoglobin 12.0 - 16.0 13.9 15.2 14.8  Hematocrit 36 - 46 39 44 43  Platelets 150 - 399 135(A) 139(A) 131(A)   CMP Latest Ref Rng & Units 04/27/2021 04/24/2021 04/12/2021  BUN 4 - _0 Creatinine 0.5 - 1.1 0.7 0.8 0.8  Sodium 137 - 147 138 138 136(A)  Potassium 3.4 - 5.3 3.5 4.2 4.1  Chloride 99 - 108 103 104 101  CO2 13 -  22 26(A) 24(A) 28(A)  Calcium 8.7 - 10.7 9.1 9.4 8.8  Alkaline Phos 25 - 125 58 75 68  AST 13 - 35 33 30 27  ALT 7 - 35 _1 ASSESSMENT & PLAN:  Assessment/Plan:  A 45 y.o. 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   It now appears she is tolerating her osimertinib therapy very well.  She has no desire to come off this agent, particularly as she is respiratory symptom-free.  I will see her back in 6 weeks for repeat clinical assessment.  A chest CT will be done a day before her next visit to ascertain her new disease baseline after 2+ months of osimertinib.  The patient understands all the plans discussed today and is in agreement with them.     I, Rita Ohara, am acting as scribe for Marice Potter, MD    I have reviewed this report as typed by the medical scribe, and it is complete and accurate.  Dequincy Macarthur Critchley, MD

## 2021-04-21 ENCOUNTER — Other Ambulatory Visit: Payer: Self-pay | Admitting: Hematology and Oncology

## 2021-04-21 ENCOUNTER — Telehealth: Payer: Self-pay

## 2021-04-21 ENCOUNTER — Other Ambulatory Visit: Payer: Self-pay

## 2021-04-21 DIAGNOSIS — G8929 Other chronic pain: Secondary | ICD-10-CM

## 2021-04-21 DIAGNOSIS — R0789 Other chest pain: Secondary | ICD-10-CM

## 2021-04-21 DIAGNOSIS — C3411 Malignant neoplasm of upper lobe, right bronchus or lung: Secondary | ICD-10-CM

## 2021-04-21 DIAGNOSIS — M549 Dorsalgia, unspecified: Secondary | ICD-10-CM

## 2021-04-21 MED ORDER — ZOLPIDEM TARTRATE 10 MG PO TABS: 10.0000 mg | ORAL_TABLET | Freq: Every evening | ORAL | 0 refills | Status: DC | PRN

## 2021-04-21 MED ORDER — OXYCODONE HCL 10 MG PO TABS: 10.0000 mg | ORAL_TABLET | ORAL | 0 refills | Status: DC | PRN

## 2021-04-21 NOTE — Telephone Encounter (Signed)
error 

## 2021-04-24 ENCOUNTER — Encounter: Payer: Self-pay | Admitting: Hematology and Oncology

## 2021-04-24 ENCOUNTER — Inpatient Hospital Stay (INDEPENDENT_AMBULATORY_CARE_PROVIDER_SITE_OTHER): Payer: Medicaid Other | Admitting: Hematology and Oncology

## 2021-04-24 ENCOUNTER — Other Ambulatory Visit: Payer: Self-pay

## 2021-04-24 ENCOUNTER — Inpatient Hospital Stay: Payer: Medicaid Other

## 2021-04-24 ENCOUNTER — Telehealth: Payer: Self-pay | Admitting: Hematology and Oncology

## 2021-04-24 VITALS — BP 150/76 | HR 77 | Temp 98.4°F | Resp 16 | Wt 143.3 lb

## 2021-04-24 DIAGNOSIS — C3411 Malignant neoplasm of upper lobe, right bronchus or lung: Secondary | ICD-10-CM

## 2021-04-24 DIAGNOSIS — Z9221 Personal history of antineoplastic chemotherapy: Secondary | ICD-10-CM | POA: Diagnosis not present

## 2021-04-24 DIAGNOSIS — C3490 Malignant neoplasm of unspecified part of unspecified bronchus or lung: Secondary | ICD-10-CM | POA: Diagnosis not present

## 2021-04-24 LAB — PREGNANCY, URINE: Preg Test, Ur: NEGATIVE

## 2021-04-24 LAB — CBC AND DIFFERENTIAL
HCT: 44 (ref 36–46)
Hemoglobin: 15.2 (ref 12.0–16.0)
Neutrophils Absolute: 2.9
Platelets: 139 — AB (ref 150–399)
WBC: 4.6

## 2021-04-24 LAB — BASIC METABOLIC PANEL
BUN: 10 (ref 4–21)
CO2: 24 — AB (ref 13–22)
Chloride: 104 (ref 99–108)
Creatinine: 0.8 (ref 0.5–1.1)
Glucose: 101
Potassium: 4.2 (ref 3.4–5.3)
Sodium: 138 (ref 137–147)

## 2021-04-24 LAB — COMPREHENSIVE METABOLIC PANEL
Albumin: 4.4 (ref 3.5–5.0)
Calcium: 9.4 (ref 8.7–10.7)

## 2021-04-24 LAB — HEPATIC FUNCTION PANEL
ALT: 17 (ref 7–35)
AST: 30 (ref 13–35)
Alkaline Phosphatase: 75 (ref 25–125)
Bilirubin, Total: 0.6

## 2021-04-24 LAB — CBC: RBC: 4.72 (ref 3.87–5.11)

## 2021-04-24 MED ORDER — METHYLPREDNISOLONE 4 MG PO TBPK: ORAL_TABLET | ORAL | 0 refills | Status: DC

## 2021-04-24 NOTE — Progress Notes (Signed)
[facility]  [address] [place],  [place]  [address] [phone]  Clinic Day:  04/24/2021  Referring physician: No ref. provider found  HISTORY OF PRESENT ILLNESS:  The patient is a 45 y.o. female with stage IVA (T4 N2 M1a) lung adenocarcinoma.  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  She has no evidence of metastatic disease.  The patient comes in today be evaluated before heading into her 2nd cycle of carboplatin/pemetrexed/pembrolizumab.  Of note, she did not receive pembrolizumab with her 1st cycle of treatment as it could not get covered for compassionate use by the time her 1st cycle of treatment was scheduled.  The patient claims her 1st cycle of treatment had her weak for the first 2 weeks.  Since then, she has gradually improved.  Of note, she reports not coughing as much and being as dyspneic as she was before her 1st cycle of treatment commenced.    She presents to clinic today for symptom management. She has been taking Tagrisso 80 mg daily for the last 3 weeks. Last Friday she developed significant rash to her face that was itchy and somewhat painful. She also noted mouth sores. The rash was also noted on her legs. We did advise her to hold the Tagrisso until we were able to evaluate in clinic today. She states the rash has almost completely resolved since stopping the medicine. She still has a faint rash to her face, mostly her chin and left cheek up to her ear. She states this is no longer uncomfortable. She also describes changes in her heart rate and pain in her hips and legs that has also improved since stopping the medication. She denies fever, chills, nausea or vomiting. She denies shortness of breath, chest pain or cough. She denies issue with bowel or bladder.    PHYSICAL EXAM:  Blood pressure (!) 150/76, pulse 77, temperature 98.4 F (36.9 C), temperature source Oral, resp. rate 16, weight 143  lb 4.8 oz (65 kg), SpO2 99 %. Wt Readings from Last 3 Encounters:  04/24/21 143 lb 4.8 oz (65 kg)  03/09/21 144 lb 1.6 oz (65.4 kg)  02/24/21 142 lb 6.4 oz (64.6 kg)   Body mass index is 24.6 kg/m. Performance status (ECOG): 1 - Symptomatic but completely ambulatory Physical Exam Constitutional:      Appearance: Normal appearance. She is not ill-appearing.  HENT:     Head:     Comments: Faint rash noted to chin and left side of face up to the ear.    Mouth/Throat:     Mouth: Mucous membranes are moist.     Pharynx: Oropharynx is clear. No oropharyngeal exudate or posterior oropharyngeal erythema.  Cardiovascular:     Rate and Rhythm: Normal rate and regular rhythm.     Heart sounds: No murmur heard.   No friction rub. No gallop.  Pulmonary:     Effort: Pulmonary effort is normal. No respiratory distress.     Breath sounds: Normal breath sounds. No wheezing, rhonchi or rales.  Abdominal:     General: Bowel sounds are normal. There is no distension.     Palpations: Abdomen is soft. There is no mass.     Tenderness: There is no abdominal tenderness.  Musculoskeletal:        General: No swelling.     Right lower leg: No edema.     Left lower leg: No edema.  Lymphadenopathy:  Cervical: No cervical adenopathy.     Upper Body:     Right upper body: No supraclavicular or axillary adenopathy.     Left upper body: No supraclavicular or axillary adenopathy.     Lower Body: No right inguinal adenopathy. No left inguinal adenopathy.  Skin:    General: Skin is warm.     Coloration: Skin is not jaundiced.     Findings: Rash present. No lesion.     Comments: Faint rash noted to face; minimal rash noted to legs  Neurological:     General: No focal deficit present.     Mental Status: She is alert and oriented to person, place, and time. Mental status is at baseline.     Cranial Nerves: Cranial nerves are intact.  Psychiatric:        Mood and Affect: Mood normal.        Behavior:  Behavior normal.        Thought Content: Thought content normal.    CBC Latest Ref Rng & Units 04/24/2021 04/12/2021 03/09/2021  WBC - 4.6 5.3 5.0  Hemoglobin 12.0 - 16.0 15.2 14.8 13.4  Hematocrit 36 - 46 44 43 38  Platelets 150 - 399 139(A) 131(A) 268   CMP Latest Ref Rng & Units 04/24/2021 04/12/2021 03/09/2021  BUN 4 - 21 10 7 8   Creatinine 0.5 - 1.1 0.8 0.8 0.6  Sodium 137 - 147 138 136(A) 135(A)  Potassium 3.4 - 5.3 4.2 4.1 4.0  Chloride 99 - 108 104 101 102  CO2 13 - 22 24(A) 28(A) 23(A)  Calcium 8.7 - 10.7 9.4 8.8 9.1  Alkaline Phos 25 - 125 75 68 73  AST 13 - 35 30 27 44(A)  ALT 7 - 35 17 16 59(A)   STUDIES:  [employer] test results show that she harbors the EGFR [id number] (Exon 19 deletion) mutation.  ASSESSMENT & PLAN:  Assessment/Plan:  A 45 y.o. 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 She held her [place] since last Friday due to rash to her legs, face and mouth sores. This has since almost completely resolved. We will have her resume Tagrisso daily and keep appointment with Dr. Bobby Rumpf this week. I will send in medrol dose pack to manage symptoms. She is anxious to stay on therapy.   She verbalizes understanding of and agreement to the plans discussed today. She knows to call the office should any new questions or concerns arise.   Melodye Ped, NP

## 2021-04-24 NOTE — Telephone Encounter (Signed)
Per 8/22 LOS, keep scheduled Appt's

## 2021-04-26 ENCOUNTER — Other Ambulatory Visit (HOSPITAL_COMMUNITY): Payer: Self-pay

## 2021-04-27 ENCOUNTER — Inpatient Hospital Stay (INDEPENDENT_AMBULATORY_CARE_PROVIDER_SITE_OTHER): Payer: Medicaid Other | Admitting: Oncology

## 2021-04-27 ENCOUNTER — Inpatient Hospital Stay: Payer: Medicaid Other

## 2021-04-27 ENCOUNTER — Other Ambulatory Visit: Payer: Self-pay

## 2021-04-27 ENCOUNTER — Other Ambulatory Visit: Payer: Self-pay | Admitting: Oncology

## 2021-04-27 ENCOUNTER — Encounter: Payer: Self-pay | Admitting: Oncology

## 2021-04-27 VITALS — BP 125/68 | HR 97 | Temp 98.3°F | Resp 16 | Ht 64.0 in | Wt 144.0 lb

## 2021-04-27 DIAGNOSIS — C3411 Malignant neoplasm of upper lobe, right bronchus or lung: Secondary | ICD-10-CM | POA: Diagnosis not present

## 2021-04-27 DIAGNOSIS — C3490 Malignant neoplasm of unspecified part of unspecified bronchus or lung: Secondary | ICD-10-CM | POA: Diagnosis not present

## 2021-04-27 LAB — CBC AND DIFFERENTIAL
HCT: 39 (ref 36–46)
Hemoglobin: 13.9 (ref 12.0–16.0)
Neutrophils Absolute: 2.46
Platelets: 135 — AB (ref 150–399)
WBC: 4.4

## 2021-04-27 LAB — BASIC METABOLIC PANEL
BUN: 9 (ref 4–21)
CO2: 26 — AB (ref 13–22)
Chloride: 103 (ref 99–108)
Creatinine: 0.7 (ref 0.5–1.1)
Glucose: 101
Potassium: 3.5 (ref 3.4–5.3)
Sodium: 138 (ref 137–147)

## 2021-04-27 LAB — PREGNANCY, URINE: Preg Test, Ur: NEGATIVE

## 2021-04-27 LAB — HEPATIC FUNCTION PANEL
ALT: 18 (ref 7–35)
AST: 33 (ref 13–35)
Alkaline Phosphatase: 58 (ref 25–125)
Bilirubin, Total: 0.5

## 2021-04-27 LAB — CBC: RBC: 4.19 (ref 3.87–5.11)

## 2021-04-27 LAB — COMPREHENSIVE METABOLIC PANEL
Albumin: 4.1 (ref 3.5–5.0)
Calcium: 9.1 (ref 8.7–10.7)

## 2021-05-04 ENCOUNTER — Telehealth: Payer: Self-pay

## 2021-05-04 NOTE — Telephone Encounter (Addendum)
05/10/21 - I spoke with pt. She is still taking the Tagrisso @ 8pm with tea, 2 hrs after eating. She has total of 3 doses sine beginning the Tagrisso (due to rash which resolved). She admits to few mouth sores, but is able to eat without  difficulty. She is using a milder toothpaste. She has had low grade fever since Monday, the highest is T 99.8. No other symptoms. Her granddaughter came home from school sick on Monday. Her granddaughters rapid COVID test and strept tests are negative. Pt is not vaccinated. I told her to call me if her granddaughter's PCR test comes back positive. Denies N/V, nail changes, & skin rashes. She has diarrhea 1-3 times per day, controlled with Imodium. I reminded her of the importance of drinking a cup of fluid for each diarrhea episode. She could also try drinking Gatorade, Powerade, or something similar to replace electrolytes that can be lost with diarrhea. Reports poor appetite.Pt is still having trouble sleeping. She is not napping during the day. She takes the Ambien we gave her @ 24mn , but the she is awake again @ 230a-300am. Pt reminded to call us with temp of 100.4 or higher, NIGHT OR DAY. Pt verbalized understanding.  05/04/21 - I attempted to call pt twice to see how she is tolerating the [place].

## 2021-05-05 ENCOUNTER — Telehealth: Payer: Self-pay

## 2021-05-05 NOTE — Telephone Encounter (Signed)
Per Dr. Bobby Rumpf.  She if afebrile this morning, she does not need to go to ED, I instructed her to call the on call oncologist if she feels bad or if fever returns this weekend.

## 2021-05-19 ENCOUNTER — Telehealth: Payer: Self-pay

## 2021-05-19 NOTE — Telephone Encounter (Signed)
Spoke with Dr. Bobby Rumpf he want me to speak with patient prior to making he an appt.   Andrea Holloway will be scheduled follow up around 06/05/2021.  I called [phone] to speak to patient and her daughter answered saying that her mom could call us back in 2 hrs, (after closing).  Told daughter that prior to making an appt we needed to speak with pt she said that [name] will call on Monday.

## 2021-05-22 ENCOUNTER — Other Ambulatory Visit: Payer: Self-pay | Admitting: Hematology and Oncology

## 2021-05-22 ENCOUNTER — Telehealth: Payer: Self-pay

## 2021-05-22 DIAGNOSIS — M549 Dorsalgia, unspecified: Secondary | ICD-10-CM

## 2021-05-22 DIAGNOSIS — G8929 Other chronic pain: Secondary | ICD-10-CM

## 2021-05-22 DIAGNOSIS — C3411 Malignant neoplasm of upper lobe, right bronchus or lung: Secondary | ICD-10-CM

## 2021-05-22 DIAGNOSIS — R0789 Other chest pain: Secondary | ICD-10-CM

## 2021-05-22 MED ORDER — OXYCODONE HCL 10 MG PO TABS: 10.0000 mg | ORAL_TABLET | ORAL | 0 refills | Status: DC | PRN

## 2021-05-23 ENCOUNTER — Other Ambulatory Visit: Payer: Self-pay | Admitting: Hematology and Oncology

## 2021-05-23 ENCOUNTER — Telehealth: Payer: Self-pay

## 2021-06-01 ENCOUNTER — Telehealth: Payer: Self-pay

## 2021-06-01 NOTE — Telephone Encounter (Addendum)
I spoke with [name]. She is taking the Tagrisso between 8p-930p with fluids. She denies missed doses. She reports that she has had diarrhea the last couple days. I encouraged her to use Imodium per box instructions with exception that she can take up to 8 tabs per day if needed. She is using something OTC for intermittent mouth sores. She denies N/V, skin rashes & fevers. She has noticed that her finger nails are brittle and are little darker in color. Her eyes are watering a little more also.

## 2021-06-23 ENCOUNTER — Telehealth: Payer: Self-pay | Admitting: Oncology

## 2021-06-23 NOTE — Telephone Encounter (Signed)
06/23/21 spoke with patients daughter and gave next appt with Dr Bobby Rumpf after ct scans

## 2021-06-28 NOTE — Progress Notes (Incomplete)
[facility]  [address] [place],  [place]  [address] [phone]  Clinic Day:  06/28/2021  Referring physician: No ref. provider found  This document serves as a record of services personally performed by Marice Potter, MD. It was created on their behalf by Curry,Lauren E, a trained medical scribe. The creation of this record is based on the scribe's personal observations and the provider's statements to them.  HISTORY OF PRESENT ILLNESS:  The patient is a 45 y.o. female with stage IVA (T4 N2 M1a) lung adenocarcinoma.  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  She has no evidence of metastatic disease.  As testing showed her to harbor the EGFR mutation, she has been taking osimertinib for the past 3-4 weeks.  The patient comes in today to review recent chest CT results after 2+ months of osimertinib. Overall, the patient claims to be doing fine.  She does not have any respiratory issues, such as shortness of breath or chest wall pain, which concern her for disease progression while on osimertinib.    PHYSICAL EXAM:  There were no vitals taken for this visit. Wt Readings from Last 3 Encounters:  04/27/21 144 lb (65.3 kg)  04/24/21 143 lb 4.8 oz (65 kg)  03/09/21 144 lb 1.6 oz (65.4 kg)   There is no height or weight on file to calculate BMI. Performance status (ECOG): 1 - Symptomatic but completely ambulatory Physical Exam Constitutional:      Appearance: Normal appearance. She is not ill-appearing.  HENT:     Mouth/Throat:     Mouth: Mucous membranes are moist.     Pharynx: Oropharynx is clear. No oropharyngeal exudate or posterior oropharyngeal erythema.  Cardiovascular:     Rate and Rhythm: Normal rate and regular rhythm.     Heart sounds: No murmur heard.   No friction rub. No gallop.  Pulmonary:     Effort: Pulmonary effort is normal. No respiratory distress.     Breath sounds: Normal breath  sounds. No wheezing, rhonchi or rales.  Abdominal:     General: Bowel sounds are normal. There is no distension.     Palpations: Abdomen is soft. There is no mass.     Tenderness: There is no abdominal tenderness.  Musculoskeletal:        General: No swelling.     Right lower leg: No edema.     Left lower leg: No edema.  Lymphadenopathy:     Cervical: No cervical adenopathy.     Upper Body:     Right upper body: No supraclavicular or axillary adenopathy.     Left upper body: No supraclavicular or axillary adenopathy.     Lower Body: No right inguinal adenopathy. No left inguinal adenopathy.  Skin:    General: Skin is warm.     Coloration: Skin is not jaundiced.     Findings: No lesion or rash.  Neurological:     General: No focal deficit present.     Mental Status: She is alert and oriented to person, place, and time. Mental status is at baseline.  Psychiatric:        Mood and Affect: Mood normal.        Behavior: Behavior normal.        Thought Content: Thought content normal.   SCANS: Recent CT chest has revealed the following: ***  CBC Latest Ref Rng & Units 04/27/2021 04/24/2021 04/12/2021  WBC -  4.4 4.6 5.3  Hemoglobin 12.0 - 16.0 13.9 15.2 14.8  Hematocrit 36 - 46 39 44 43  Platelets 150 - 399 135(A) 139(A) 131(A)   CMP Latest Ref Rng & Units 04/27/2021 04/24/2021 04/12/2021  BUN 4 - _0 Creatinine 0.5 - 1.1 0.7 0.8 0.8  Sodium 137 - 147 138 138 136(A)  Potassium 3.4 - 5.3 3.5 4.2 4.1  Chloride 99 - 108 103 104 101  CO2 13 - 22 26(A) 24(A) 28(A)  Calcium 8.7 - 10.7 9.1 9.4 8.8  Alkaline Phos 25 - 125 58 75 68  AST 13 - 35 33 30 27  ALT 7 - 35 _1 ASSESSMENT & PLAN:  Assessment/Plan:  A 45 y.o. 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   It appears she is tolerating her osimertinib therapy very well.  She has no desire to come off this agent, particularly as she is respiratory symptom-free.  I will see her back in 6  weeks for repeat clinical assessment.  A chest CT will be done a day before her next visit to ascertain her new disease baseline after 2+ months of osimertinib.  The patient understands all the plans discussed today and is in agreement with them.     I, Rita Ohara, am acting as scribe for Marice Potter, MD    I have reviewed this report as typed by the medical scribe, and it is complete and accurate.  Dequincy Macarthur Critchley, MD

## 2021-07-03 ENCOUNTER — Telehealth: Payer: Self-pay | Admitting: Oncology

## 2021-07-03 ENCOUNTER — Inpatient Hospital Stay: Admission: RE | Admit: 2021-07-03 | Payer: Medicaid Other | Source: Ambulatory Visit

## 2021-07-03 ENCOUNTER — Other Ambulatory Visit: Payer: Medicaid Other

## 2021-07-03 NOTE — Telephone Encounter (Signed)
07/03/21 Patient cancelled all appt will call back to reschedule

## 2021-07-04 ENCOUNTER — Ambulatory Visit: Payer: Medicaid Other | Admitting: Oncology

## 2021-07-06 ENCOUNTER — Other Ambulatory Visit: Payer: Self-pay | Admitting: Hematology and Oncology

## 2021-07-06 ENCOUNTER — Telehealth: Payer: Self-pay

## 2021-07-06 ENCOUNTER — Inpatient Hospital Stay: Payer: Medicaid Other | Attending: Hematology and Oncology

## 2021-07-06 DIAGNOSIS — C3411 Malignant neoplasm of upper lobe, right bronchus or lung: Secondary | ICD-10-CM | POA: Diagnosis not present

## 2021-07-06 DIAGNOSIS — R0789 Other chest pain: Secondary | ICD-10-CM

## 2021-07-06 DIAGNOSIS — G8929 Other chronic pain: Secondary | ICD-10-CM

## 2021-07-06 LAB — CBC: RBC: 4.64 (ref 3.87–5.11)

## 2021-07-06 LAB — CBC AND DIFFERENTIAL
HCT: 42 (ref 36–46)
Hemoglobin: 14.8 (ref 12.0–16.0)
Neutrophils Absolute: 2.94
Platelets: 137 — AB (ref 150–399)
WBC: 4.9

## 2021-07-06 LAB — HEPATIC FUNCTION PANEL
ALT: 17 (ref 7–35)
AST: 27 (ref 13–35)
Alkaline Phosphatase: 72 (ref 25–125)
Bilirubin, Total: 0.5

## 2021-07-06 LAB — BASIC METABOLIC PANEL
BUN: 9 (ref 4–21)
CO2: 27 — AB (ref 13–22)
Chloride: 105 (ref 99–108)
Creatinine: 0.8 (ref 0.5–1.1)
Glucose: 97
Potassium: 3.5 (ref 3.4–5.3)
Sodium: 138 (ref 137–147)

## 2021-07-06 LAB — COMPREHENSIVE METABOLIC PANEL
Albumin: 4.4 (ref 3.5–5.0)
Calcium: 9 (ref 8.7–10.7)

## 2021-07-06 LAB — PREGNANCY, URINE: Preg Test, Ur: NEGATIVE

## 2021-07-06 MED ORDER — OXYCODONE HCL ER 15 MG PO T12A
15.0000 mg | EXTENDED_RELEASE_TABLET | Freq: Two times a day (BID) | ORAL | 0 refills | Status: DC
Start: 2021-07-06 — End: 2021-08-02

## 2021-07-06 MED ORDER — OXYCODONE HCL 10 MG PO TABS: 10.0000 mg | ORAL_TABLET | ORAL | 0 refills | Status: DC | PRN

## 2021-07-06 NOTE — Telephone Encounter (Signed)
Pt arrived to clinic for me to check port site. The port a cath site looks great. No redness, no swelling, nothing open, no drainage, & no warmth to area. You can see the port nobs and the wire right under the skin. I instructed pt that the port site is fine. Incision scar healed well.   While pt is here, we got labs, as pt is taking Tagrisso daily and hasn't been in clinic in a while. Her CT chest was mistakenly cancelled, so her appt w/Dr Bobby Rumpf had not been made.  Pt reports she is noticing a little more tightness in chest when in inclined position. She denies SOB however. No fevers. No cough.  Pt asked to weigh herself . Weight 142.3#  She is taking the Tagrisso between 8p-9p each night, on empty stomach. Denies missed doses.  Langley Gauss will work on scheduling pt's CT chest tomorrow and call pt with appt's. Pt notified. Pt also needed refill of Oxycodone - Melissa P,NP, eRx to [facility].

## 2021-07-07 ENCOUNTER — Encounter: Payer: Self-pay | Admitting: Hematology and Oncology

## 2021-07-10 ENCOUNTER — Telehealth: Payer: Self-pay | Admitting: Oncology

## 2021-07-10 NOTE — Telephone Encounter (Signed)
07/10/21 Left msg for ct appts and dv.

## 2021-07-24 NOTE — Progress Notes (Signed)
[facility]  [address] [place],  [place]  [address] [phone]  Clinic Day:  08/02/2021  Referring physician: No ref. provider found  This document serves as a record of services personally performed by Marice Potter, MD. It was created on their behalf by Curry,Lauren E, a trained medical scribe. The creation of this record is based on the scribe's personal observations and the provider's statements to them.  HISTORY OF PRESENT ILLNESS:  The patient is a 45 y.o. female with stage IVA (T4 N2 M1a) lung adenocarcinoma.  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  She has no evidence of metastatic disease.  As testing showed her to harbor the EGFR mutation, she has been taking osimertinib for the past 3 months.  The patient comes in today for routine follow-up.  Overall, the patient claims to be doing fine.  She does not have any respiratory issues, such as shortness of breath or chest wall pain, which concern her for disease progression while on osimertinib.    PHYSICAL EXAM:  Blood pressure 120/76, pulse 89, temperature 98.1 F (36.7 C), resp. rate 16, height 5' 4"  (1.626 m), weight 145 lb (65.8 kg), SpO2 91 %. Wt Readings from Last 3 Encounters:  08/02/21 145 lb (65.8 kg)  04/27/21 144 lb (65.3 kg)  04/24/21 143 lb 4.8 oz (65 kg)   Body mass index is 24.89 kg/m. Performance status (ECOG): 1 - Symptomatic but completely ambulatory Physical Exam Constitutional:      Appearance: Normal appearance. She is not ill-appearing.  HENT:     Mouth/Throat:     Mouth: Mucous membranes are moist.     Pharynx: Oropharynx is clear. No oropharyngeal exudate or posterior oropharyngeal erythema.  Cardiovascular:     Rate and Rhythm: Normal rate and regular rhythm.     Heart sounds: No murmur heard.   No friction rub. No gallop.  Pulmonary:     Effort: Pulmonary effort is normal. No respiratory distress.     Breath  sounds: Normal breath sounds. No wheezing, rhonchi or rales.  Abdominal:     General: Bowel sounds are normal. There is no distension.     Palpations: Abdomen is soft. There is no mass.     Tenderness: There is no abdominal tenderness.  Musculoskeletal:        General: No swelling.     Right lower leg: No edema.     Left lower leg: No edema.  Lymphadenopathy:     Cervical: No cervical adenopathy.     Upper Body:     Right upper body: No supraclavicular or axillary adenopathy.     Left upper body: No supraclavicular or axillary adenopathy.     Lower Body: No right inguinal adenopathy. No left inguinal adenopathy.  Skin:    General: Skin is warm.     Coloration: Skin is not jaundiced.     Findings: No lesion or rash.  Neurological:     General: No focal deficit present.     Mental Status: She is alert and oriented to person, place, and time. Mental status is at baseline.  Psychiatric:        Mood and Affect: Mood normal.        Behavior: Behavior normal.        Thought Content: Thought content normal.   SCANS: Recent CT chest has revealed the following: FINDINGS: Cardiovascular: Right chest port catheter. Normal heart size.  No pericardial effusion.  Mediastinum/Nodes: Interval decrease in size of a pretracheal lymph node, measuring 0.8 x 0.7 cm, previously 1.3 x 1.0 cm (series 2, image 54). Calcified mediastinal and right hilar lymph nodes. Thyroid gland, trachea, and esophagus demonstrate no significant findings.  Lungs/Pleura: Small right pleural effusion, diminished compared to prior examination. Moderate centrilobular emphysema. Significant interval reduction in bulk of mass and or consolidation of the right upper lobe seen on prior examination, with dramatically improved aeration of the right upper lobe. Residual consolidation or scarring of the inferior medial right upper lobe (series 3, image 60). Additional resolution of consolidation of the anterior right  lower lobe. Persistent, although improved interlobular septal thickening about the right lung base (series 3, image 97).  Upper Abdomen: No acute abnormality.  Musculoskeletal: No chest wall mass. Probable sclerotic osseous metastasis of the inferior endplate of L2 (series 6, image 75).  IMPRESSION: 1. Significant interval reduction in bulk of mass and or consolidation of the right upper lobe seen on prior examination, with dramatically improved aeration of the right upper lobe. Residual consolidation or scarring of the inferior medial right upper lobe. Additional resolution of consolidation of the anterior right lower lobe. 2. Interval decrease in size of a pretracheal lymph node. 3. Persistent, although improved interlobular septal thickening about the right lung base. 4. Small right pleural effusion, diminished compared to prior examination. 5. Constellation of findings is consistent with treatment response primary lung malignancy as well as nodal, pleural, and lymphangitic metastatic disease. 6. 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 7. Emphysema.  Emphysema (ICD10-J43.9).  CBC Latest Ref Rng & Units 07/06/2021 04/27/2021 04/24/2021  WBC - 4.9 4.4 4.6  Hemoglobin 12.0 - 16.0 14.8 13.9 15.2  Hematocrit 36 - 46 42 39 44  Platelets 150 - 399 137(A) 135(A) 139(A)   CMP Latest Ref Rng & Units 07/06/2021 04/27/2021 04/24/2021  BUN 4 - 21 9 9 10   Creatinine 0.5 - 1.1 0.8 0.7 0.8  Sodium 137 - 147 138 138 138  Potassium 3.4 - 5.3 3.5 3.5 4.2  Chloride 99 - 108 105 103 104  CO2 13 - 22 27(A) 26(A) 24(A)  Calcium 8.7 - 10.7 9.0 9.1 9.4  Alkaline Phos 25 - 125 72 58 75  AST 13 - 35 27 33 30  ALT 7 - 35 17 18 17    ASSESSMENT & PLAN:  Assessment/Plan:  A 45 y.o. 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   In clinic, I reviewed the CT  images with her, for which she could see the significant disease improvement she has had after 3 months of osimertinib.  Understandably, she was pleased with her CT scan images.  She knows to continue taking her  osimertinib on a daily basis.  I will see her back in 2 months for repeat clinical assessment. The patient understands all the plans discussed today and is in agreement with them.     I, Rita Ohara, am acting as scribe for Marice Potter, MD    I have reviewed this report as typed by the medical scribe, and it is complete and accurate.  Ameka Krigbaum Macarthur Critchley, MD

## 2021-08-01 ENCOUNTER — Ambulatory Visit
Admission: RE | Admit: 2021-08-01 | Discharge: 2021-08-01 | Disposition: A | Discharge status: Home / Self Care | Payer: Medicaid Other | Source: Ambulatory Visit | Attending: Oncology | Admitting: Oncology

## 2021-08-01 ENCOUNTER — Other Ambulatory Visit: Payer: Self-pay

## 2021-08-01 DIAGNOSIS — C3411 Malignant neoplasm of upper lobe, right bronchus or lung: Secondary | ICD-10-CM

## 2021-08-01 MED ORDER — IOPAMIDOL (ISOVUE-300) INJECTION 61%
75.0000 mL | Freq: Once | INTRAVENOUS | Status: AC | PRN
  Administered 2021-08-01: 75 mL via INTRAVENOUS

## 2021-08-02 ENCOUNTER — Inpatient Hospital Stay (INDEPENDENT_AMBULATORY_CARE_PROVIDER_SITE_OTHER): Payer: Medicaid Other | Admitting: Oncology

## 2021-08-02 ENCOUNTER — Other Ambulatory Visit: Payer: Self-pay | Admitting: Hematology and Oncology

## 2021-08-02 VITALS — BP 120/76 | HR 89 | Temp 98.1°F | Resp 16 | Ht 64.0 in | Wt 145.0 lb

## 2021-08-02 DIAGNOSIS — C3411 Malignant neoplasm of upper lobe, right bronchus or lung: Secondary | ICD-10-CM | POA: Diagnosis not present

## 2021-08-02 DIAGNOSIS — R0789 Other chest pain: Secondary | ICD-10-CM

## 2021-08-02 DIAGNOSIS — G8929 Other chronic pain: Secondary | ICD-10-CM

## 2021-08-02 MED ORDER — OXYCODONE HCL ER 15 MG PO T12A: 15.0000 mg | EXTENDED_RELEASE_TABLET | Freq: Two times a day (BID) | ORAL | 0 refills | Status: DC

## 2021-08-02 MED ORDER — OXYCODONE HCL 10 MG PO TABS: 10.0000 mg | ORAL_TABLET | ORAL | 0 refills | Status: DC | PRN

## 2021-08-02 MED ORDER — ZOLPIDEM TARTRATE 10 MG PO TABS
10.0000 mg | ORAL_TABLET | Freq: Every evening | ORAL | 0 refills | Status: DC | PRN
Start: 2021-08-02 — End: 2022-04-20

## 2021-08-02 NOTE — Progress Notes (Signed)
Pt is concerned about nail bed discoloration.

## 2021-08-07 ENCOUNTER — Telehealth: Payer: Self-pay | Admitting: Pharmacy Technician

## 2021-08-07 ENCOUNTER — Other Ambulatory Visit: Payer: Self-pay | Admitting: Hematology and Oncology

## 2021-08-07 MED ORDER — OSIMERTINIB MESYLATE 80 MG PO TABS
80.0000 mg | ORAL_TABLET | Freq: Every day | ORAL | 3 refills | Status: DC
  Filled 2021-08-07 – 2021-09-18 (×2): qty 30, 30d supply, fill #0
  Filled 2021-11-01: qty 30, 30d supply, fill #1
  Filled 2021-11-01: qty 30, 30d supply, fill #0
  Filled 2021-11-23: qty 30, 30d supply, fill #1
  Filled 2021-12-26: qty 30, 30d supply, fill #2

## 2021-08-07 NOTE — Telephone Encounter (Signed)
Received notification that patient now has insurance and office would like her to start filling through [facility] in January.  Submitted a Prior Authorization request to [place] for  [place]  via CoverMyMeds. Will update once we receive a response.   Key: [id number] - PA Case ID: [id number]

## 2021-08-08 ENCOUNTER — Other Ambulatory Visit (HOSPITAL_COMMUNITY): Payer: Self-pay

## 2021-08-08 NOTE — Telephone Encounter (Signed)
Received notification from [place] regarding a prior authorization for  [place] . Authorization has been APPROVED from 08/07/21 to 08/07/22.   Per test claim, copay for 30 days supply is $4.00  Authorization # [id number]

## 2021-08-09 ENCOUNTER — Other Ambulatory Visit (HOSPITAL_COMMUNITY): Payer: Self-pay

## 2021-08-18 ENCOUNTER — Other Ambulatory Visit: Payer: Self-pay

## 2021-08-18 ENCOUNTER — Telehealth: Payer: Self-pay | Admitting: Oncology

## 2021-08-18 DIAGNOSIS — G8929 Other chronic pain: Secondary | ICD-10-CM

## 2021-08-18 DIAGNOSIS — C3411 Malignant neoplasm of upper lobe, right bronchus or lung: Secondary | ICD-10-CM

## 2021-08-18 DIAGNOSIS — R0789 Other chest pain: Secondary | ICD-10-CM

## 2021-08-18 MED ORDER — OXYCODONE HCL ER 15 MG PO T12A: 15.0000 mg | EXTENDED_RELEASE_TABLET | Freq: Two times a day (BID) | ORAL | 0 refills | Status: DC

## 2021-08-18 MED ORDER — OXYCODONE HCL 10 MG PO TABS: 10.0000 mg | ORAL_TABLET | ORAL | 0 refills | Status: DC | PRN

## 2021-08-18 NOTE — Telephone Encounter (Signed)
Per 11/30 LOS, patient scheduled for Jan 2023 Appt's.  Mailed Appt Summary

## 2021-08-21 ENCOUNTER — Other Ambulatory Visit (HOSPITAL_COMMUNITY): Payer: Self-pay

## 2021-08-23 ENCOUNTER — Other Ambulatory Visit: Payer: Self-pay | Admitting: Hematology and Oncology

## 2021-08-30 ENCOUNTER — Other Ambulatory Visit (HOSPITAL_COMMUNITY): Payer: Self-pay

## 2021-08-31 ENCOUNTER — Other Ambulatory Visit: Payer: Self-pay

## 2021-08-31 DIAGNOSIS — M542 Cervicalgia: Secondary | ICD-10-CM

## 2021-08-31 DIAGNOSIS — G8929 Other chronic pain: Secondary | ICD-10-CM

## 2021-08-31 DIAGNOSIS — C3411 Malignant neoplasm of upper lobe, right bronchus or lung: Secondary | ICD-10-CM

## 2021-08-31 DIAGNOSIS — R0789 Other chest pain: Secondary | ICD-10-CM

## 2021-08-31 MED ORDER — OXYCODONE HCL ER 15 MG PO T12A: 15.0000 mg | EXTENDED_RELEASE_TABLET | Freq: Two times a day (BID) | ORAL | 0 refills | Status: DC

## 2021-08-31 MED ORDER — OXYCODONE HCL 10 MG PO TABS: 10.0000 mg | ORAL_TABLET | ORAL | 0 refills | Status: DC | PRN

## 2021-09-13 ENCOUNTER — Other Ambulatory Visit: Payer: Self-pay

## 2021-09-13 DIAGNOSIS — G8929 Other chronic pain: Secondary | ICD-10-CM

## 2021-09-13 DIAGNOSIS — R0789 Other chest pain: Secondary | ICD-10-CM

## 2021-09-13 DIAGNOSIS — M549 Dorsalgia, unspecified: Secondary | ICD-10-CM

## 2021-09-13 DIAGNOSIS — C3411 Malignant neoplasm of upper lobe, right bronchus or lung: Secondary | ICD-10-CM

## 2021-09-13 MED ORDER — OXYCODONE HCL ER 15 MG PO T12A: 15.0000 mg | EXTENDED_RELEASE_TABLET | Freq: Two times a day (BID) | ORAL | 0 refills | Status: DC

## 2021-09-13 MED ORDER — OXYCODONE HCL 10 MG PO TABS: 10.0000 mg | ORAL_TABLET | ORAL | 0 refills | Status: DC | PRN

## 2021-09-18 ENCOUNTER — Other Ambulatory Visit (HOSPITAL_COMMUNITY): Payer: Self-pay

## 2021-09-18 ENCOUNTER — Other Ambulatory Visit: Payer: Self-pay | Admitting: Hematology and Oncology

## 2021-09-18 DIAGNOSIS — F411 Generalized anxiety disorder: Secondary | ICD-10-CM

## 2021-09-18 MED ORDER — PROCHLORPERAZINE MALEATE 10 MG PO TABS: 10.0000 mg | ORAL_TABLET | Freq: Four times a day (QID) | ORAL | 3 refills | Status: DC | PRN

## 2021-09-25 ENCOUNTER — Other Ambulatory Visit (HOSPITAL_COMMUNITY): Payer: Self-pay

## 2021-09-25 ENCOUNTER — Telehealth: Payer: Self-pay

## 2021-09-25 NOTE — Telephone Encounter (Addendum)
09/26/2021 - Pt being seen in ER.   09/25/21 - Pt states that, "my chronic angina is getting worse. Instead of lasting 5-10 seconds, it is lasting 30-35 seconds, the last 2-3 days". I asked pt if she had taken any nitroglycerin or any medication? She replied, "No, I haven't been seen by a primary doctor in 10 years. That's why I have been trying to get set up with a primary doctor. The doctor my insurance company put on my insurance card, is under investigation. No appt's are being given". First and foremost, I told her if she experiences any more angina or chest pain, she needs to call 911 immediately. She needs quick medical attention, as the pain could lead to a heart attack. She verbalized understanding. She states the pain happens when she lies down and describes it as squeezing and the longer it lasts the tighter it feels". I asked if she had mentioned this to any of our staff in the past? She said, "Yes". I sent the above message to Dr Bobby Rumpf and Everlene Balls.

## 2021-09-26 ENCOUNTER — Telehealth: Payer: Self-pay

## 2021-09-26 NOTE — Telephone Encounter (Addendum)
@  1341- Caitriona Sundquist,RN: I notified pt of Dr Bobby Rumpf' response. She plans to just continue the [place]. She mentioned, "I did miss yesterdays dose because I went to bed early and didn't awake to take it".  @ 1340- Dr Bobby Rumpf: she can use ibuprofen.Marland KitchenMarland KitchenMarland KitchenTagrisso can be held; however, if she isn't having any particular problems with the Tagrisso while dealing with COVID, it can continue to be taken.   09/28/21 Pt's daughter called to make sure it was ok for her mom to take the Nyquil severe cold and flu w/Tagrisso? Also pt asked if she could use ibuprofen for body aches and pain, as tylenol isn't helping at all. It old her I would ask and call her back.  09/27/2021 @ 1231 I returned pt's call. She would like to know if she can take the Nyquil with the Alpha? She also wants to know what she can take for the headache, because tylenol isn't working.  I sent the above message to [facility] and Dr Bobby Rumpf @ 1237-awc  09/27/2021 - Pt has LVM on nurse line today @ 1136. She states, "they tested me for the angina and it was negative. But I still have the pain there. I don't understand".    09/27/2021 Pt was seen in ER yesterday afternoon. She tested positive for COVID. They completed an EKG, CT chest and perfusion testing. Pt was discharged home.  09/26/21 - Pt states, "I am in the emergency room because my temperature is 103. They have done a COVID test and told me it will take an hour to get the result.They did just put me in a room". I told her to please tell them about her chronic angina that she reported yesterday. Pt replied, "I just told her about that".   Dr.Lewis and Melissa,NP, notified of above.   Pt has tested positive for COVID in ER.. I notified Melissa,NP @ 1450.

## 2021-09-26 NOTE — Progress Notes (Incomplete)
[facility]  [address] [place],  [place]  [address] [phone]  Clinic Day:  09/26/2021  Referring physician: No ref. provider found  This document serves as a record of services personally performed by Marice Potter, MD. It was created on their behalf by Curry,Lauren E, a trained medical scribe. The creation of this record is based on the scribe's personal observations and the provider's statements to them.  HISTORY OF PRESENT ILLNESS:  The patient is a 46 y.o. female with stage IVA (T4 N2 M1a) lung adenocarcinoma.  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  She has no evidence of metastatic disease.  As testing showed her to harbor the EGFR mutation, she has been taking osimertinib for the past 3 months.  The patient comes in today for routine follow-up.  Overall, the patient claims to be doing fine.  She does not have any respiratory issues, such as shortness of breath or chest wall pain, which concern her for disease progression while on osimertinib.    PHYSICAL EXAM:  There were no vitals taken for this visit. Wt Readings from Last 3 Encounters:  08/02/21 145 lb (65.8 kg)  04/27/21 144 lb (65.3 kg)  04/24/21 143 lb 4.8 oz (65 kg)   There is no height or weight on file to calculate BMI. Performance status (ECOG): 1 - Symptomatic but completely ambulatory Physical Exam Constitutional:      Appearance: Normal appearance. She is not ill-appearing.  HENT:     Mouth/Throat:     Mouth: Mucous membranes are moist.     Pharynx: Oropharynx is clear. No oropharyngeal exudate or posterior oropharyngeal erythema.  Cardiovascular:     Rate and Rhythm: Normal rate and regular rhythm.     Heart sounds: No murmur heard.   No friction rub. No gallop.  Pulmonary:     Effort: Pulmonary effort is normal. No respiratory distress.     Breath sounds: Normal breath sounds. No wheezing, rhonchi or rales.  Abdominal:      General: Bowel sounds are normal. There is no distension.     Palpations: Abdomen is soft. There is no mass.     Tenderness: There is no abdominal tenderness.  Musculoskeletal:        General: No swelling.     Right lower leg: No edema.     Left lower leg: No edema.  Lymphadenopathy:     Cervical: No cervical adenopathy.     Upper Body:     Right upper body: No supraclavicular or axillary adenopathy.     Left upper body: No supraclavicular or axillary adenopathy.     Lower Body: No right inguinal adenopathy. No left inguinal adenopathy.  Skin:    General: Skin is warm.     Coloration: Skin is not jaundiced.     Findings: No lesion or rash.  Neurological:     General: No focal deficit present.     Mental Status: She is alert and oriented to person, place, and time. Mental status is at baseline.  Psychiatric:        Mood and Affect: Mood normal.        Behavior: Behavior normal.        Thought Content: Thought content normal.    CBC Latest Ref Rng & Units 07/06/2021 04/27/2021 04/24/2021  WBC - 4.9 4.4 4.6  Hemoglobin 12.0 - 16.0 14.8 13.9 15.2  Hematocrit 36 - 46 42  39 44  Platelets 150 - 399 137(A) 135(A) 139(A)   CMP Latest Ref Rng & Units 07/06/2021 04/27/2021 04/24/2021  BUN 4 - _0 Creatinine 0.5 - 1.1 0.8 0.7 0.8  Sodium 137 - 147 138 138 138  Potassium 3.4 - 5.3 3.5 3.5 4.2  Chloride 99 - 108 105 103 104  CO2 13 - 22 27(A) 26(A) 24(A)  Calcium 8.7 - 10.7 9.0 9.1 9.4  Alkaline Phos 25 - 125 72 58 75  AST 13 - 35 27 33 30  ALT 7 - 35 _1 ASSESSMENT & PLAN:  Assessment/Plan:  A 46 y.o. 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  She knows to continue taking her  osimertinib on a daily basis.  I will see her back in 2 months for repeat clinical assessment. The patient understands all the plans discussed today and is in agreement with them.     I, Rita Ohara, am acting as scribe for Marice Potter, MD    I have  reviewed this report as typed by the medical scribe, and it is complete and accurate.  Dequincy Macarthur Critchley, MD

## 2021-10-02 ENCOUNTER — Other Ambulatory Visit: Payer: Self-pay

## 2021-10-02 ENCOUNTER — Telehealth: Payer: Self-pay

## 2021-10-02 ENCOUNTER — Ambulatory Visit: Payer: Medicaid Other | Admitting: Oncology

## 2021-10-02 ENCOUNTER — Other Ambulatory Visit: Payer: Medicaid Other

## 2021-10-02 DIAGNOSIS — G8929 Other chronic pain: Secondary | ICD-10-CM

## 2021-10-02 DIAGNOSIS — C3411 Malignant neoplasm of upper lobe, right bronchus or lung: Secondary | ICD-10-CM

## 2021-10-02 DIAGNOSIS — R0789 Other chest pain: Secondary | ICD-10-CM

## 2021-10-02 DIAGNOSIS — M549 Dorsalgia, unspecified: Secondary | ICD-10-CM

## 2021-10-02 MED ORDER — OXYCODONE HCL 10 MG PO TABS: 10.0000 mg | ORAL_TABLET | ORAL | 0 refills | Status: DC | PRN

## 2021-10-02 MED ORDER — OXYCODONE HCL ER 15 MG PO T12A: 15.0000 mg | EXTENDED_RELEASE_TABLET | Freq: Two times a day (BID) | ORAL | 0 refills | Status: DC

## 2021-10-02 NOTE — Telephone Encounter (Addendum)
Pt scheduled for Thursday for labs, and see Melissa,NP. ----- Message from Melodye Ped, NP sent at 10/02/2021  2:05 PM EST ----- Regarding: RE: Vaginal bleeding for 12 days I'm happy to examine her, but she really needs gyn for possible endometrial biopsy. But, I can see her this week, examine her and get her referred.  ----- Message ----- From: Dairl Ponder, RN Sent: 10/02/2021   2:00 PM EST To: Melodye Ped, NP Subject: Vaginal bleeding for 12 days                   Pt reporting she has had vaginal bleeding for the last 12 days. She noticed it when she was wiping, bright red in color. She is concerned because she hasn't had menstrual cycle in long time. No rectal bleeding/hemorrhoids.

## 2021-10-05 ENCOUNTER — Other Ambulatory Visit: Payer: Medicaid Other

## 2021-10-05 ENCOUNTER — Ambulatory Visit: Payer: Medicaid Other | Admitting: Hematology and Oncology

## 2021-10-06 NOTE — Progress Notes (Incomplete)
[facility]  [address] [place],  [place]  [address] [phone]  Clinic Day:  10/06/2021  Referring physician: No ref. provider found  This document serves as a record of services personally performed by Marice Potter, MD. It was created on their behalf by Curry,Lauren E, a trained medical scribe. The creation of this record is based on the scribe's personal observations and the provider's statements to them.  HISTORY OF PRESENT ILLNESS:  The patient is a 46 y.o. female with stage IVA (T4 N2 M1a) lung adenocarcinoma.  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  She has no evidence of metastatic disease.  As testing showed her to harbor the EGFR mutation, she has been taking osimertinib for the past 3 months.  The patient comes in today for routine follow-up.  Overall, the patient claims to be doing fine.  She does not have any respiratory issues, such as shortness of breath or chest wall pain, which concern her for disease progression while on osimertinib.    PHYSICAL EXAM:  There were no vitals taken for this visit. Wt Readings from Last 3 Encounters:  08/02/21 145 lb (65.8 kg)  04/27/21 144 lb (65.3 kg)  04/24/21 143 lb 4.8 oz (65 kg)   There is no height or weight on file to calculate BMI. Performance status (ECOG): 1 - Symptomatic but completely ambulatory Physical Exam Constitutional:      Appearance: Normal appearance. She is not ill-appearing.  HENT:     Mouth/Throat:     Mouth: Mucous membranes are moist.     Pharynx: Oropharynx is clear. No oropharyngeal exudate or posterior oropharyngeal erythema.  Cardiovascular:     Rate and Rhythm: Normal rate and regular rhythm.     Heart sounds: No murmur heard.   No friction rub. No gallop.  Pulmonary:     Effort: Pulmonary effort is normal. No respiratory distress.     Breath sounds: Normal breath sounds. No wheezing, rhonchi or rales.  Abdominal:      General: Bowel sounds are normal. There is no distension.     Palpations: Abdomen is soft. There is no mass.     Tenderness: There is no abdominal tenderness.  Musculoskeletal:        General: No swelling.     Right lower leg: No edema.     Left lower leg: No edema.  Lymphadenopathy:     Cervical: No cervical adenopathy.     Upper Body:     Right upper body: No supraclavicular or axillary adenopathy.     Left upper body: No supraclavicular or axillary adenopathy.     Lower Body: No right inguinal adenopathy. No left inguinal adenopathy.  Skin:    General: Skin is warm.     Coloration: Skin is not jaundiced.     Findings: No lesion or rash.  Neurological:     General: No focal deficit present.     Mental Status: She is alert and oriented to person, place, and time. Mental status is at baseline.  Psychiatric:        Mood and Affect: Mood normal.        Behavior: Behavior normal.        Thought Content: Thought content normal.    CBC Latest Ref Rng & Units 07/06/2021 04/27/2021 04/24/2021  WBC - 4.9 4.4 4.6  Hemoglobin 12.0 - 16.0 14.8 13.9 15.2  Hematocrit 36 - 46 42  39 44  Platelets 150 - 399 137(A) 135(A) 139(A)   CMP Latest Ref Rng & Units 07/06/2021 04/27/2021 04/24/2021  BUN 4 - _0 Creatinine 0.5 - 1.1 0.8 0.7 0.8  Sodium 137 - 147 138 138 138  Potassium 3.4 - 5.3 3.5 3.5 4.2  Chloride 99 - 108 105 103 104  CO2 13 - 22 27(A) 26(A) 24(A)  Calcium 8.7 - 10.7 9.0 9.1 9.4  Alkaline Phos 25 - 125 72 58 75  AST 13 - 35 27 33 30  ALT 7 - 35 _1 ASSESSMENT & PLAN:  Assessment/Plan:  A 46 y.o. 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  She knows to continue taking her  osimertinib on a daily basis.  I will see her back in 2 months for repeat clinical assessment. The patient understands all the plans discussed today and is in agreement with them.     I, Rita Ohara, am acting as scribe for Marice Potter, MD    I have  reviewed this report as typed by the medical scribe, and it is complete and accurate.  Dequincy Macarthur Critchley, MD

## 2021-10-10 ENCOUNTER — Other Ambulatory Visit (HOSPITAL_COMMUNITY): Payer: Self-pay

## 2021-10-12 ENCOUNTER — Ambulatory Visit: Payer: Medicaid Other | Admitting: Oncology

## 2021-10-12 ENCOUNTER — Other Ambulatory Visit: Payer: Medicaid Other

## 2021-10-16 ENCOUNTER — Other Ambulatory Visit: Payer: Self-pay | Admitting: Hematology and Oncology

## 2021-10-16 ENCOUNTER — Inpatient Hospital Stay (INDEPENDENT_AMBULATORY_CARE_PROVIDER_SITE_OTHER): Payer: Medicaid Other | Admitting: Oncology

## 2021-10-16 ENCOUNTER — Other Ambulatory Visit: Payer: Self-pay | Admitting: Oncology

## 2021-10-16 ENCOUNTER — Inpatient Hospital Stay: Payer: Medicaid Other | Attending: Hematology and Oncology

## 2021-10-16 ENCOUNTER — Encounter: Payer: Self-pay | Admitting: Oncology

## 2021-10-16 ENCOUNTER — Other Ambulatory Visit: Payer: Self-pay

## 2021-10-16 VITALS — BP 139/70 | HR 84 | Temp 98.1°F | Resp 16 | Ht 64.0 in | Wt 146.4 lb

## 2021-10-16 DIAGNOSIS — Z3202 Encounter for pregnancy test, result negative: Secondary | ICD-10-CM | POA: Insufficient documentation

## 2021-10-16 DIAGNOSIS — C3411 Malignant neoplasm of upper lobe, right bronchus or lung: Secondary | ICD-10-CM

## 2021-10-16 DIAGNOSIS — M542 Cervicalgia: Secondary | ICD-10-CM

## 2021-10-16 DIAGNOSIS — R0789 Other chest pain: Secondary | ICD-10-CM

## 2021-10-16 DIAGNOSIS — G8929 Other chronic pain: Secondary | ICD-10-CM

## 2021-10-16 LAB — HEPATIC FUNCTION PANEL
ALT: 16 (ref 7–35)
AST: 25 (ref 13–35)
Alkaline Phosphatase: 63 (ref 25–125)
Bilirubin, Total: 0.5

## 2021-10-16 LAB — CBC AND DIFFERENTIAL
HCT: 42 (ref 36–46)
Hemoglobin: 14.7 (ref 12.0–16.0)
Neutrophils Absolute: 5.24
Platelets: 185 (ref 150–399)
WBC: 6.8

## 2021-10-16 LAB — BASIC METABOLIC PANEL
BUN: 8 (ref 4–21)
CO2: 28 — AB (ref 13–22)
Chloride: 105 (ref 99–108)
Creatinine: 0.7 (ref 0.5–1.1)
Glucose: 91
Potassium: 3.6 (ref 3.4–5.3)
Sodium: 138 (ref 137–147)

## 2021-10-16 LAB — COMPREHENSIVE METABOLIC PANEL
Albumin: 4 (ref 3.5–5.0)
Calcium: 8.3 — AB (ref 8.7–10.7)

## 2021-10-16 LAB — PREGNANCY, URINE: Preg Test, Ur: NEGATIVE

## 2021-10-16 LAB — CBC: RBC: 4.51 (ref 3.87–5.11)

## 2021-10-16 MED ORDER — OXYCODONE HCL ER 15 MG PO T12A: 15.0000 mg | EXTENDED_RELEASE_TABLET | Freq: Two times a day (BID) | ORAL | 0 refills | Status: DC

## 2021-10-16 MED ORDER — OXYCODONE HCL 10 MG PO TABS: 10.0000 mg | ORAL_TABLET | ORAL | 0 refills | Status: DC | PRN

## 2021-10-16 NOTE — Progress Notes (Signed)
° ° °[facility]  °[address] °[place],  [place]  [address] °[phone] ° °Clinic Day:  10/16/2021 ° °Referring physician: No ref. provider found ° °This document serves as a record of services personally performed by Dequincy A Lewis, MD. It was created on their behalf by Curry,Lauren E, a trained medical scribe. The creation of this record is based on the scribe's personal observations and the provider's statements to them. ° °HISTORY OF PRESENT ILLNESS:  °The patient is a 45 y.o. female with stage IVA (T4 N2 M1a) lung adenocarcinoma.  The stage IVA designation was based upon her having a malignant pleural effusion.  She had no evidence of distant metastasis.  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  The patient comes in today to review her most recent chest CT.  Overall, the patient claims to be doing fine.  She denies having any respiratory issues, such as shortness of breath or chest wall pain, which concern her for disease progression while on osimertinib.  °  °PHYSICAL EXAM:  °Blood pressure 139/70, pulse 84, temperature 98.1 °F (36.7 °C), temperature source Oral, resp. rate 16, height 5' 4" (1.626 m), weight 146 lb 6.4 oz (66.4 kg), SpO2 99 %. °Wt Readings from Last 3 Encounters:  °10/16/21 146 lb 6.4 oz (66.4 kg)  °08/02/21 145 lb (65.8 kg)  °04/27/21 144 lb (65.3 kg)  ° °Body mass index is 25.13 kg/m². °Performance status (ECOG): 1 - Symptomatic but completely ambulatory °Physical Exam °Constitutional:   °   Appearance: Normal appearance. She is not ill-appearing.  °HENT:  °   Mouth/Throat:  °   Mouth: Mucous membranes are moist.  °   Pharynx: Oropharynx is clear. No oropharyngeal exudate or posterior oropharyngeal erythema.  °Cardiovascular:  °   Rate and Rhythm: Normal rate and regular rhythm.  °   Heart sounds: No murmur heard. °  No friction rub. No gallop.  °Pulmonary:  °   Effort: Pulmonary effort is  normal. No respiratory distress.  °   Breath sounds: Normal breath sounds. No wheezing, rhonchi or rales.  °Abdominal:  °   General: Bowel sounds are normal. There is no distension.  °   Palpations: Abdomen is soft. There is no mass.  °   Tenderness: There is no abdominal tenderness.  °Musculoskeletal:     °   General: No swelling.  °   Right lower leg: No edema.  °   Left lower leg: No edema.  °Lymphadenopathy:  °   Cervical: No cervical adenopathy.  °   Upper Body:  °   Right upper body: No supraclavicular or axillary adenopathy.  °   Left upper body: No supraclavicular or axillary adenopathy.  °   Lower Body: No right inguinal adenopathy. No left inguinal adenopathy.  °Skin: °   General: Skin is warm.  °   Coloration: Skin is not jaundiced.  °   Findings: No lesion or rash.  °Neurological:  °   General: No focal deficit present.  °   Mental Status: She is alert and oriented to person, place, and time. Mental status is at baseline.  °Psychiatric:     °   Mood and Affect: Mood normal.     °   Behavior: Behavior normal.     °   Thought Content: Thought content normal.  ° °SCANS: Recent CT chest has revealed the following: °FINDINGS:  °Right   Right chest wall port a catheter is seen with tip overlying the  central superior vena cava.  Cardiovascular: Heart size is normal. No pericardial effusion. No  thoracic aortic aneurysm.  Mediastinum/Nodes: No axillary lymphadenopathy. Interval resolution  of the prior enlargement of a subcarinal lymph node previously  measuring 1.8 cm compared to 0.7 cm currently. There is again  calcification within the right inferior bronchial region, likely the  sequela of remote chronic granulomatous infection. There are tiny  right paratracheal lymph nodes where previously lymph nodes  measuring up to 1.1 cm in short axis were seen. These have resolved.  There are again chunky right hilar calcifications, likely from the  sequela of prior granulomatous infection.  The esophagus  follows a normal course of normal caliber. The thyroid  is grossly unremarkable.  Lungs/Pleura: The central airways are patent. There is curvilinear  surgical scarring in the region of the right upper lobe where  previously there was anterior medial right upper lobe mass. This  mass is no longer visualized and may have been resected in the interval. There is a 1.9 cm cyst within  the posteroinferior medial right upper lobe in the region of the interval resection.  Mild-to-moderate upper lung centrilobular emphysematous changes are  noted.  There is a calcified granuloma at the anterior inferior right lung  base, likely corresponding to a similar calcified granuloma  previously seen slightly more posterior from the anterior pleura,  with the interval change likely due to the partially resected lung  in the interval. No definite residual recurrent right lung soft  tissue mass is identified. Mild left lower lobe curvilinear scarring  is similar to prior. No pleural effusion or pneumothorax.  Upper Abdomen: Unremarkable.  Musculoskeletal: Mild multilevel degenerative disc and joint changes  of the thoracic spine. No aggressive lytic or blastic osseous lesion  is seen.   IMPRESSION: 1. Compared to 03/10/2021, apparent interval resection of the  previously seen large anteromedial right upper lobe mass. There is  postsurgical scarring within the right upper lobe without a definite  residual recurrent soft tissue mass.  2. Interval resolution of the prior right paratracheal and  subcarinal lymphadenopathy.  3. No acute lung process.   ASSESSMENT & PLAN:  Assessment/Plan:  A 46 y.o. 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   In clinic today, I reviewed her CT images with her, for which she could see that she essentially has had a complete response since being on osimertinib since July 2022.  Clinically, she is doing extremely well.  She knows to continue  taking osimertinib on a daily basis for her disease management.  I will see her back in 4 months for repeat clinical assessment.  Another chest CT will be done a day before her next visit for her continued radiographic lung cancer surveillance.  The patient understands all the plans discussed today and is in agreement with them.     I, Rita Ohara, am acting as scribe for Marice Potter, MD    I have reviewed this report as typed by the medical scribe, and it is complete and accurate.  Keghan Mcfarren Macarthur Critchley, MD

## 2021-10-30 ENCOUNTER — Other Ambulatory Visit (HOSPITAL_COMMUNITY): Payer: Self-pay

## 2021-11-01 ENCOUNTER — Other Ambulatory Visit (HOSPITAL_COMMUNITY): Payer: Self-pay

## 2021-11-01 ENCOUNTER — Other Ambulatory Visit: Payer: Self-pay

## 2021-11-03 ENCOUNTER — Other Ambulatory Visit (HOSPITAL_COMMUNITY): Payer: Self-pay

## 2021-11-08 ENCOUNTER — Other Ambulatory Visit: Payer: Self-pay

## 2021-11-13 ENCOUNTER — Other Ambulatory Visit: Payer: Self-pay

## 2021-11-13 DIAGNOSIS — M542 Cervicalgia: Secondary | ICD-10-CM

## 2021-11-13 DIAGNOSIS — G8929 Other chronic pain: Secondary | ICD-10-CM

## 2021-11-13 DIAGNOSIS — R0789 Other chest pain: Secondary | ICD-10-CM

## 2021-11-13 DIAGNOSIS — C3411 Malignant neoplasm of upper lobe, right bronchus or lung: Secondary | ICD-10-CM

## 2021-11-13 MED ORDER — OXYCODONE HCL 10 MG PO TABS: 10.0000 mg | ORAL_TABLET | ORAL | 0 refills | Status: DC | PRN

## 2021-11-13 MED ORDER — OXYCODONE HCL ER 15 MG PO T12A: 15.0000 mg | EXTENDED_RELEASE_TABLET | Freq: Two times a day (BID) | ORAL | 0 refills | Status: DC

## 2021-11-23 ENCOUNTER — Other Ambulatory Visit (HOSPITAL_COMMUNITY): Payer: Self-pay

## 2021-11-23 ENCOUNTER — Other Ambulatory Visit: Payer: Self-pay

## 2021-11-23 ENCOUNTER — Telehealth: Payer: Self-pay

## 2021-11-23 DIAGNOSIS — G8929 Other chronic pain: Secondary | ICD-10-CM

## 2021-11-23 DIAGNOSIS — C3411 Malignant neoplasm of upper lobe, right bronchus or lung: Secondary | ICD-10-CM

## 2021-11-23 DIAGNOSIS — R0789 Other chest pain: Secondary | ICD-10-CM

## 2021-11-23 MED ORDER — OXYCODONE HCL ER 15 MG PO T12A: 15.0000 mg | EXTENDED_RELEASE_TABLET | Freq: Two times a day (BID) | ORAL | 0 refills | Status: DC

## 2021-11-23 NOTE — Telephone Encounter (Addendum)
Pt is need of refill for her OxyContin 15mg  q 12hr. An electronic prescription was sent to [facility] on 11/13/21, but they have been unable to fill it (manufacturer cant get the drug). ? ?I called [facility] on [address] and they were able to take take prescription and dispense. ?

## 2021-12-04 ENCOUNTER — Other Ambulatory Visit (HOSPITAL_COMMUNITY): Payer: Self-pay

## 2021-12-06 ENCOUNTER — Telehealth: Payer: Self-pay

## 2021-12-06 NOTE — Telephone Encounter (Addendum)
? ?  12/12/21  Amy,RN:  Pt called to make an appt. She states, "a couple of different times when I was talking with my grandchildren, they told me grandma what your saying doesn't make sense". I asked if she was having any other symptoms such as vision changes? She replied, "sometimes my vision is blurry, like I cant focus". I asked if she had any headaches. She replied, "yes and I am always tired. I used to could do things during the week in the house. I don't have the energy anymore. My nails are changing too, and falling off". I asked if she could come in tomorrow to be seen. She replied, "no, I want have a ride until next Monday. My son is working out of town. I can just come next week, these things are just minor". I told her the nail changes are from the [place].  If you feel like she needs to come sooner, maybe she would listen to you? I sent the above message to [facility] P,NP @ 1540-awc. ? ? ? ? ?Aemon Koeller,RN: ?I didn't not call mom back on 12/05/2021 because I could not find documentation in chart that allowed me to speak with mom. ?Gay Filler did call patient and patient gave verbal permission and also came to Center and signed release of information,. ? ?Mom called again on 12/06/2021.  I tried to return call to Mom [facility]) with out success.  Left vague voice mail message to call us back if she still needs information. ?

## 2021-12-07 ENCOUNTER — Other Ambulatory Visit (HOSPITAL_COMMUNITY): Payer: Self-pay

## 2021-12-12 ENCOUNTER — Other Ambulatory Visit: Payer: Self-pay | Admitting: Hematology and Oncology

## 2021-12-12 ENCOUNTER — Other Ambulatory Visit: Payer: Self-pay

## 2021-12-12 DIAGNOSIS — R0789 Other chest pain: Secondary | ICD-10-CM

## 2021-12-12 DIAGNOSIS — C3411 Malignant neoplasm of upper lobe, right bronchus or lung: Secondary | ICD-10-CM

## 2021-12-12 DIAGNOSIS — G8929 Other chronic pain: Secondary | ICD-10-CM

## 2021-12-12 MED ORDER — OXYCODONE HCL 10 MG PO TABS: 10.0000 mg | ORAL_TABLET | ORAL | 0 refills | Status: DC | PRN

## 2021-12-12 NOTE — Telephone Encounter (Addendum)
Pt notified that Melissa recommends she gets a brain MRI. I told her once her insurance approves it, scheduling would be calling her to set the appt up. She verbalized understanding. ? ?----- Message from Melodye Ped, NP sent at 12/12/2021  4:05 PM EDT ----- ?Regarding: RE: New vision changes, headaches, increased fatigue ?We should still get a brain MRI due to the confusion. I can order that and we can be getting it set up. ? ? ?----- Message ----- ?From: Dairl Ponder, RN ?Sent: 12/12/2021   3:46 PM EDT ?To: Melodye Ped, NP ?Subject: FW: New vision changes, headaches, increased# ? ?UPDATED @ 8250: Vision changes, headaches, increased fatigue and nail changes are all s/e of the Tagrisso. ?----- Message ----- ?From: Dairl Ponder, RN ?Sent: 12/12/2021   3:38 PM EDT ?To: Melodye Ped, NP ?Subject: New vision changes, headaches, increased fat# ? ?Pt called to make an appt. She states, "a couple of different times when I was talking with my grandchildren, they told me grandma what your saying doesn't make sense". I asked if she was having any other symptoms such as vision changes? She replied, "sometimes my vision is blurry, like I cant focus". I asked if she had any headaches. She replied, "yes and I am always tired. I used to could do things during the week in the house. I don't have the energy anymore. My nails are changing too, and falling off". I asked if she could come in tomorrow to be seen. She replied, "no, I want have a ride until next Monday. My son is working out of town. I can just come next week, these things are just minor". I told her the nail changes are from the [place].  If you feel like she needs to come sooner, maybe she would listen to you? Surely we could get [facility] system to get her here right? ? ? ? ?

## 2021-12-15 ENCOUNTER — Other Ambulatory Visit: Payer: Self-pay | Admitting: Hematology and Oncology

## 2021-12-15 DIAGNOSIS — C3411 Malignant neoplasm of upper lobe, right bronchus or lung: Secondary | ICD-10-CM

## 2021-12-18 ENCOUNTER — Ambulatory Visit: Payer: Medicaid Other | Admitting: Hematology and Oncology

## 2021-12-22 ENCOUNTER — Other Ambulatory Visit: Payer: Self-pay

## 2021-12-22 DIAGNOSIS — C3411 Malignant neoplasm of upper lobe, right bronchus or lung: Secondary | ICD-10-CM

## 2021-12-22 DIAGNOSIS — G8929 Other chronic pain: Secondary | ICD-10-CM

## 2021-12-22 DIAGNOSIS — R0789 Other chest pain: Secondary | ICD-10-CM

## 2021-12-22 MED ORDER — OXYCODONE HCL ER 15 MG PO T12A: 15.0000 mg | EXTENDED_RELEASE_TABLET | Freq: Two times a day (BID) | ORAL | 0 refills | Status: DC

## 2021-12-25 ENCOUNTER — Encounter: Payer: Self-pay | Admitting: Hematology and Oncology

## 2021-12-25 ENCOUNTER — Other Ambulatory Visit: Payer: Self-pay

## 2021-12-25 ENCOUNTER — Inpatient Hospital Stay: Payer: Medicaid Other | Attending: Hematology and Oncology | Admitting: Hematology and Oncology

## 2021-12-25 DIAGNOSIS — C3411 Malignant neoplasm of upper lobe, right bronchus or lung: Secondary | ICD-10-CM | POA: Diagnosis not present

## 2021-12-25 DIAGNOSIS — F411 Generalized anxiety disorder: Secondary | ICD-10-CM

## 2021-12-25 MED ORDER — LORAZEPAM 1 MG PO TABS: 1.0000 mg | ORAL_TABLET | Freq: Three times a day (TID) | ORAL | 0 refills | Status: DC | PRN

## 2021-12-25 NOTE — Assessment & Plan Note (Signed)
The patient is a?45 y.o.?female?with stage IVA (T4 N2 M1a) lung adenocarcinoma. ?The stage IVA designation was based upon her having a malignant pleural effusion. ?She had no evidence of distant metastasis.  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 Recent CT imaging revealed an almost complete response. Today, she presents with new onset confusion, lightheadedness and vision changes. We will plan for MRI of the brain with and without contrast.  ?

## 2021-12-25 NOTE — Progress Notes (Cosign Needed)
?Patient Care Team: ?Allean Found, MD as PCP - General (Family Medicine) ?Gardiner Rhyme, MD as Referring Physician (Pulmonary Disease) ?Marice Potter, MD as Consulting Physician (Oncology) ?Gatha Mayer, MD as Consulting Physician (Radiation Oncology) ? ?Clinic Day:  12/25/2021 ? ?Referring physician: No ref. provider found ? ?ASSESSMENT & PLAN:  ? ?Assessment & Plan: ?Malignant neoplasm of upper lobe of right lung ([place]) ?The patient is a 46 y.o. female with stage IVA (T4 N2 M1a) lung adenocarcinoma.  The stage IVA designation was based upon her having a malignant pleural effusion.  She had no evidence of distant metastasis.  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 Recent CT imaging revealed an almost complete response. Today, she presents with new onset confusion, lightheadedness and vision changes. We will plan for MRI of the brain with and without contrast.   ? ?The patient understands the plans discussed today and is in agreement with them.  She knows to contact our office if she develops concerns prior to her next appointment. ? ? ? ?Melodye Ped, NP  ?[address] ?[place] [address] ?Dept: [phone] ?Dept Fax: [phone]  ? ?No orders of the defined types were placed in this encounter. ?  ? ? ?CHIEF COMPLAINT:  ?CC: A 46 year old female with history of lung cancer here for symptom management; confusion, vision changes and lightheadedness ? ?Current Treatment:  Tagrisso ? ?INTERVAL HISTORY:  ?Natacia is here today for repeat clinical assessment. She denies fevers or chills. She denies pain. Her appetite is good. Her weight has been stable. ? ?I have reviewed the past medical history, past surgical history, social history and family history with the patient and they are unchanged from previous note. ? ?ALLERGIES:  is allergic to iodine. ? ?MEDICATIONS:   ?Current Outpatient Medications  ?Medication Sig Dispense Refill  ? LORazepam (ATIVAN) 1 MG tablet Take 1 tablet (1 mg total) by mouth every 8 (eight) hours as needed for anxiety (or nausea). 30 tablet 0  ? nicotine (NICODERM CQ - DOSED IN MG/24 HOURS) 21 mg/24hr patch Place 1 patch (21 mg total) onto the skin daily. 28 patch 0  ? ondansetron (ZOFRAN) 4 MG tablet Take 1 tablet (4 mg total) by mouth every 4 (four) hours as needed for nausea. 90 tablet 3  ? osimertinib mesylate (TAGRISSO) 80 MG tablet Take 1 tablet (80 mg total) by mouth daily. 30 tablet 3  ? oxyCODONE (OXYCONTIN) 15 mg 12 hr tablet Take 1 tablet (15 mg total) by mouth every 12 (twelve) hours. 60 tablet 0  ? Oxycodone HCl 10 MG TABS Take 1 tablet (10 mg total) by mouth every 4 (four) hours as needed. 120 tablet 0  ? prochlorperazine (COMPAZINE) 10 MG tablet Take 1 tablet (10 mg total) by mouth every 6 (six) hours as needed for nausea or vomiting. 90 tablet 3  ? zolpidem (AMBIEN) 10 MG tablet Take 1 tablet (10 mg total) by mouth at bedtime as needed for sleep. 30 tablet 0  ? ?No current facility-administered medications for this visit.  ? ? ?HISTORY OF PRESENT ILLNESS:  ? ?Oncology History  ?Malignant neoplasm of upper lobe of right lung ([place])  ?02/03/2021 Initial Diagnosis  ? Malignant neoplasm of upper lobe of right lung ([place]) ? ?  ?02/13/2021 - 02/13/2021 Chemotherapy  ?  ? ?  ? ?  ?02/20/2021 - 02/20/2021 Chemotherapy  ?  ? ?  ? ?  ?  ? ? ?  REVIEW OF SYSTEMS:  ? ?Constitutional: Denies fevers, chills or abnormal weight loss ?Eyes: Denies blurriness of vision ?Ears, nose, mouth, throat, and face: Denies mucositis or sore throat ?Respiratory: Denies cough, dyspnea or wheezes ?Cardiovascular: Denies palpitation, chest discomfort or lower extremity swelling ?Gastrointestinal:  Denies nausea, heartburn or change in bowel habits ?Skin: Denies abnormal skin rashes ?Lymphatics: Denies new lymphadenopathy or easy bruising ?Neurological:Denies numbness, tingling  or new weaknesses ?Behavioral/Psych: Mood is stable, no new changes  ?All other systems were reviewed with the patient and are negative. ? ? ?VITALS:  ?There were no vitals taken for this visit.  ?Wt Readings from Last 3 Encounters:  ?10/16/21 146 lb 6.4 oz (66.4 kg)  ?08/02/21 145 lb (65.8 kg)  ?04/27/21 144 lb (65.3 kg)  ?  ?There is no height or weight on file to calculate BMI. ? ?Performance status (ECOG): 1 - Symptomatic but completely ambulatory ? ?PHYSICAL EXAM:  ? ?GENERAL:alert, no distress and comfortable ?SKIN: skin color, texture, turgor are normal, no rashes or significant lesions ?EYES: normal, Conjunctiva are pink and non-injected, sclera clear ?OROPHARYNX:no exudate, no erythema and lips, buccal mucosa, and tongue normal  ?NECK: supple, thyroid normal size, non-tender, without nodularity ?LYMPH:  no palpable lymphadenopathy in the cervical, axillary or inguinal ?LUNGS: clear to auscultation and percussion with normal breathing effort ?HEART: regular rate & rhythm and no murmurs and no lower extremity edema ?ABDOMEN:abdomen soft, non-tender and normal bowel sounds ?Musculoskeletal:no cyanosis of digits and no clubbing  ?NEURO: alert & oriented x 3 with fluent speech, no focal motor/sensory deficits ? ?LABORATORY DATA:  ?I have reviewed the data as listed ?   ?Component Value Date/Time  ? NA 138 10/16/2021 0000  ? K 3.6 10/16/2021 0000  ? CL 105 10/16/2021 0000  ? CO2 28 (A) 10/16/2021 0000  ? BUN 8 10/16/2021 0000  ? CREATININE 0.7 10/16/2021 0000  ? CALCIUM 8.3 (A) 10/16/2021 0000  ? ALBUMIN 4.0 10/16/2021 0000  ? AST 25 10/16/2021 0000  ? ALT 16 10/16/2021 0000  ? ALKPHOS 63 10/16/2021 0000  ? ? ?No results found for: SPEP, UPEP ? ?Lab Results  ?Component Value Date  ? WBC 6.8 10/16/2021  ? NEUTROABS 5.24 10/16/2021  ? HGB 14.7 10/16/2021  ? HCT 42 10/16/2021  ? PLT 185 10/16/2021  ? ? ?  Chemistry   ?   ?Component Value Date/Time  ? NA 138 10/16/2021 0000  ? K 3.6 10/16/2021 0000  ? CL 105  10/16/2021 0000  ? CO2 28 (A) 10/16/2021 0000  ? BUN 8 10/16/2021 0000  ? CREATININE 0.7 10/16/2021 0000  ? GLU 91 10/16/2021 0000  ?    ?Component Value Date/Time  ? CALCIUM 8.3 (A) 10/16/2021 0000  ? ALKPHOS 63 10/16/2021 0000  ? AST 25 10/16/2021 0000  ? ALT 16 10/16/2021 0000  ?  ? ? ? ?RADIOGRAPHIC STUDIES: ?I have personally reviewed the radiological images as listed and agreed with the findings in the report. ?No results found. ?

## 2021-12-26 ENCOUNTER — Other Ambulatory Visit (HOSPITAL_COMMUNITY): Payer: Self-pay

## 2022-01-02 ENCOUNTER — Telehealth: Payer: Self-pay

## 2022-01-02 ENCOUNTER — Other Ambulatory Visit: Payer: Self-pay | Admitting: Hematology and Oncology

## 2022-01-02 ENCOUNTER — Other Ambulatory Visit (HOSPITAL_COMMUNITY): Payer: Self-pay

## 2022-01-02 DIAGNOSIS — C3411 Malignant neoplasm of upper lobe, right bronchus or lung: Secondary | ICD-10-CM

## 2022-01-02 DIAGNOSIS — G8929 Other chronic pain: Secondary | ICD-10-CM

## 2022-01-02 DIAGNOSIS — R0789 Other chest pain: Secondary | ICD-10-CM

## 2022-01-02 MED ORDER — OXYCODONE HCL 10 MG PO TABS: 10.0000 mg | ORAL_TABLET | ORAL | 0 refills | Status: DC | PRN

## 2022-01-02 MED ORDER — OXYCODONE HCL ER 15 MG PO T12A: 15.0000 mg | EXTENDED_RELEASE_TABLET | Freq: Two times a day (BID) | ORAL | 0 refills | Status: DC

## 2022-01-02 NOTE — Telephone Encounter (Signed)
-----   Message from Melodye Ped, NP sent at 01/02/2022  4:48 PM EDT ----- ?Regarding: RE: pain med refill ?Sent both to [facility] on [address] ?----- Message ----- ?From: Daryel November, LPN ?Sent: 01/02/2022   4:02 PM EDT ?To: Melodye Ped, NP ?Subject: pain med refill                               ? ?Patient wants refill on oxycontin , she has previously ran out and has been using oxycodone and now running short on oxycodone  also has 20 left. thanks ? ? ?

## 2022-01-02 NOTE — Telephone Encounter (Signed)
I spoke with patient concerning pre meds prior to MRI,  [name] NP also spoke with patient reguarding meds prior to MRI. Patient voiced understanding. ?

## 2022-01-03 ENCOUNTER — Encounter: Payer: Self-pay | Admitting: Hematology and Oncology

## 2022-01-10 DIAGNOSIS — C3490 Malignant neoplasm of unspecified part of unspecified bronchus or lung: Secondary | ICD-10-CM | POA: Insufficient documentation

## 2022-01-10 DIAGNOSIS — F17200 Nicotine dependence, unspecified, uncomplicated: Secondary | ICD-10-CM

## 2022-01-10 HISTORY — DX: Malignant neoplasm of unspecified part of unspecified bronchus or lung: C34.90

## 2022-01-10 HISTORY — DX: Nicotine dependence, unspecified, uncomplicated: F17.200

## 2022-01-19 ENCOUNTER — Other Ambulatory Visit (HOSPITAL_BASED_OUTPATIENT_CLINIC_OR_DEPARTMENT_OTHER): Payer: Self-pay | Admitting: Family Medicine

## 2022-01-19 DIAGNOSIS — Z1231 Encounter for screening mammogram for malignant neoplasm of breast: Secondary | ICD-10-CM

## 2022-01-19 DIAGNOSIS — N950 Postmenopausal bleeding: Secondary | ICD-10-CM

## 2022-01-23 ENCOUNTER — Other Ambulatory Visit (HOSPITAL_COMMUNITY): Payer: Self-pay

## 2022-01-23 ENCOUNTER — Other Ambulatory Visit: Payer: Self-pay | Admitting: Hematology and Oncology

## 2022-01-23 MED ORDER — OSIMERTINIB MESYLATE 80 MG PO TABS
80.0000 mg | ORAL_TABLET | Freq: Every day | ORAL | 3 refills | Status: DC
  Filled 2022-01-26: qty 30, 30d supply, fill #0
  Filled 2022-02-22: qty 30, 30d supply, fill #1
  Filled 2022-03-26: qty 30, 30d supply, fill #2
  Filled 2022-04-19: qty 30, 30d supply, fill #3

## 2022-01-26 ENCOUNTER — Other Ambulatory Visit (HOSPITAL_COMMUNITY): Payer: Self-pay

## 2022-01-31 ENCOUNTER — Ambulatory Visit (HOSPITAL_BASED_OUTPATIENT_CLINIC_OR_DEPARTMENT_OTHER)
Admission: RE | Admit: 2022-01-31 | Discharge: 2022-01-31 | Disposition: A | Discharge status: Home / Self Care | Payer: Medicaid Other | Source: Ambulatory Visit | Attending: Family Medicine | Admitting: Family Medicine

## 2022-01-31 ENCOUNTER — Ambulatory Visit (HOSPITAL_BASED_OUTPATIENT_CLINIC_OR_DEPARTMENT_OTHER): Admission: RE | Admit: 2022-01-31 | Payer: Medicaid Other | Source: Ambulatory Visit

## 2022-01-31 ENCOUNTER — Other Ambulatory Visit (HOSPITAL_BASED_OUTPATIENT_CLINIC_OR_DEPARTMENT_OTHER): Payer: Self-pay | Admitting: Family Medicine

## 2022-01-31 ENCOUNTER — Other Ambulatory Visit (HOSPITAL_COMMUNITY): Payer: Self-pay

## 2022-01-31 DIAGNOSIS — N950 Postmenopausal bleeding: Secondary | ICD-10-CM | POA: Insufficient documentation

## 2022-02-02 ENCOUNTER — Other Ambulatory Visit: Payer: Self-pay

## 2022-02-02 DIAGNOSIS — C3411 Malignant neoplasm of upper lobe, right bronchus or lung: Secondary | ICD-10-CM

## 2022-02-02 DIAGNOSIS — G8929 Other chronic pain: Secondary | ICD-10-CM

## 2022-02-02 DIAGNOSIS — R0789 Other chest pain: Secondary | ICD-10-CM

## 2022-02-02 MED ORDER — OXYCODONE HCL 5 MG PO TABS: 10.0000 mg | ORAL_TABLET | Freq: Four times a day (QID) | ORAL | 0 refills | Status: DC | PRN

## 2022-02-02 MED ORDER — OXYCODONE HCL ER 15 MG PO T12A: 15.0000 mg | EXTENDED_RELEASE_TABLET | Freq: Two times a day (BID) | ORAL | 0 refills | Status: DC

## 2022-02-06 ENCOUNTER — Other Ambulatory Visit (HOSPITAL_COMMUNITY): Payer: Self-pay

## 2022-02-09 ENCOUNTER — Other Ambulatory Visit: Payer: Self-pay

## 2022-02-09 ENCOUNTER — Telehealth: Payer: Self-pay

## 2022-02-09 DIAGNOSIS — C3411 Malignant neoplasm of upper lobe, right bronchus or lung: Secondary | ICD-10-CM

## 2022-02-09 DIAGNOSIS — G8929 Other chronic pain: Secondary | ICD-10-CM

## 2022-02-09 DIAGNOSIS — R0789 Other chest pain: Secondary | ICD-10-CM

## 2022-02-09 MED ORDER — OXYCODONE HCL 5 MG PO TABS: 10.0000 mg | ORAL_TABLET | Freq: Four times a day (QID) | ORAL | 0 refills | Status: DC | PRN

## 2022-02-09 NOTE — Telephone Encounter (Signed)
Pt ordered to have CT chest w/contrast on 02/12/2022. She is allergic per CT dept. They want to know if he would like to switch it to CT chest without contrast as previous Cts or send in premeds for pt? Message sent to Dr Bobby Rumpf @ 1706-awc.

## 2022-02-12 ENCOUNTER — Other Ambulatory Visit: Payer: Self-pay | Admitting: Hematology and Oncology

## 2022-02-13 ENCOUNTER — Ambulatory Visit: Payer: Medicaid Other | Admitting: Oncology

## 2022-02-14 ENCOUNTER — Telehealth: Payer: Self-pay | Admitting: Oncology

## 2022-02-14 ENCOUNTER — Other Ambulatory Visit: Payer: Self-pay | Admitting: Hematology and Oncology

## 2022-02-14 ENCOUNTER — Other Ambulatory Visit (HOSPITAL_COMMUNITY): Payer: Self-pay

## 2022-02-14 ENCOUNTER — Telehealth: Payer: Self-pay | Admitting: *Deleted

## 2022-02-14 DIAGNOSIS — R0789 Other chest pain: Secondary | ICD-10-CM

## 2022-02-14 DIAGNOSIS — G8929 Other chronic pain: Secondary | ICD-10-CM

## 2022-02-14 DIAGNOSIS — C3411 Malignant neoplasm of upper lobe, right bronchus or lung: Secondary | ICD-10-CM

## 2022-02-14 MED ORDER — PREDNISONE 50 MG PO TABS: ORAL_TABLET | ORAL | 2 refills | Status: DC

## 2022-02-14 MED ORDER — DIAZEPAM 5 MG PO TABS: 5.0000 mg | ORAL_TABLET | Freq: Once | ORAL | 0 refills | Status: AC

## 2022-02-14 MED ORDER — OXYCODONE HCL 5 MG PO TABS: 10.0000 mg | ORAL_TABLET | Freq: Four times a day (QID) | ORAL | 0 refills | Status: DC | PRN

## 2022-02-14 NOTE — Telephone Encounter (Signed)
Received a message from Vicente Males with [facility] stating that patient told her that she is only able to get 7 day supply of her Oxycodone and Oxycontin. Vicente Males is requesting that you do a Prior Authorization so that patient can get a 30 - 60 day supply instead a of one week supply and to call patient back at [phone] to et her know that a PA has been submitted.

## 2022-02-14 NOTE — Telephone Encounter (Signed)
02/14/22 spoke with patients son and resch all appts

## 2022-02-14 NOTE — Telephone Encounter (Signed)
Spoke with pharmacy and initiated cover my meds for oxycontin new script for oxycodone with more tablets sent to pharmacy, family aware we are awaiting insurance response on one and the other will be submitted once pharmacy runs script and they will let us know if further Josem Kaufmann is needed.Family voiced understanding.

## 2022-02-15 ENCOUNTER — Ambulatory Visit: Payer: Medicaid Other | Admitting: Oncology

## 2022-02-19 ENCOUNTER — Other Ambulatory Visit: Payer: Self-pay | Admitting: Oncology

## 2022-02-20 ENCOUNTER — Other Ambulatory Visit (HOSPITAL_COMMUNITY): Payer: Self-pay

## 2022-02-20 ENCOUNTER — Ambulatory Visit: Payer: Medicaid Other | Admitting: Oncology

## 2022-02-20 ENCOUNTER — Inpatient Hospital Stay (HOSPITAL_BASED_OUTPATIENT_CLINIC_OR_DEPARTMENT_OTHER): Admission: RE | Admit: 2022-02-20 | Payer: Medicaid Other | Source: Ambulatory Visit

## 2022-02-21 ENCOUNTER — Encounter: Payer: Self-pay | Admitting: Oncology

## 2022-02-22 ENCOUNTER — Other Ambulatory Visit (HOSPITAL_COMMUNITY): Payer: Self-pay

## 2022-02-22 ENCOUNTER — Ambulatory Visit: Payer: Medicaid Other | Admitting: Oncology

## 2022-02-26 ENCOUNTER — Other Ambulatory Visit: Payer: Self-pay | Admitting: Oncology

## 2022-02-26 ENCOUNTER — Inpatient Hospital Stay: Payer: Medicaid Other | Attending: Hematology and Oncology | Admitting: Oncology

## 2022-02-26 VITALS — BP 118/75 | HR 94 | Temp 98.7°F | Resp 16 | Ht 63.7 in | Wt 143.2 lb

## 2022-02-26 DIAGNOSIS — C3411 Malignant neoplasm of upper lobe, right bronchus or lung: Secondary | ICD-10-CM

## 2022-02-27 ENCOUNTER — Encounter (HOSPITAL_BASED_OUTPATIENT_CLINIC_OR_DEPARTMENT_OTHER): Payer: Self-pay

## 2022-02-27 ENCOUNTER — Ambulatory Visit (HOSPITAL_BASED_OUTPATIENT_CLINIC_OR_DEPARTMENT_OTHER): Payer: Medicaid Other

## 2022-02-28 ENCOUNTER — Other Ambulatory Visit (HOSPITAL_COMMUNITY): Payer: Self-pay

## 2022-03-21 ENCOUNTER — Other Ambulatory Visit: Payer: Self-pay

## 2022-03-21 DIAGNOSIS — G8929 Other chronic pain: Secondary | ICD-10-CM

## 2022-03-21 DIAGNOSIS — R0789 Other chest pain: Secondary | ICD-10-CM

## 2022-03-21 DIAGNOSIS — C3411 Malignant neoplasm of upper lobe, right bronchus or lung: Secondary | ICD-10-CM

## 2022-03-22 ENCOUNTER — Other Ambulatory Visit (HOSPITAL_COMMUNITY): Payer: Self-pay

## 2022-03-22 NOTE — Progress Notes (Deleted)
[facility]  [address] [place],  [place]  [address] [phone]  Clinic Day:  03/22/2022  Referring physician: Allean Found, MD   HISTORY OF PRESENT ILLNESS:  The patient is a 46 y.o. female with stage IVA (T4 N2 M1a) lung adenocarcinoma.  The stage IVA designation was based upon her having a malignant pleural effusion.  She had no evidence of distant metastasis.  As testing showed her to harbor the EGFR mutation, she has been taking osimertinib since July 2022, which she has tolerated without difficulty.  Her most recent chest CT in June did not reveal any evidence of disease.  She is added to the schedule today as she telephoned requesting a refill of her narcotic prescriptions, which did not seem entirely appropriate given her most recent CT imaging.  She has been on long-acting oxycodone with immediate release oxycodone for breakthrough since her diagnosis.  At that time, she had a large lung mass mediastinal adenopathy.  She also required thoracentesis for pleural effusion.  She has not had metastatic disease outside of the lung.  She came to [place] on immediate release oxycodone 5 mg which was not effective.  She was placed on long-acting oxycodone 15 mg every 12 hours, in addition to immediate release oxycodone 5 mg.  Her dosage of immediate release oxycodone was titrated up to 10 mg every 4 hours.  For quite some time, she was only using 60 tablets/month, this was increased to 120 tablets/month in January, at which time she had COVID.  Her most recent prescription was a 5 mg tablet take 2 every 4 hours as needed..  Most recent CT chest revealed scarring in the right upper lobe, which was stable, and no lymphadenopathy without new or progressive findings.  PHYSICAL EXAM:  There were no vitals taken for this visit. Wt Readings from Last 3 Encounters:  02/26/22 143 lb 3.2 oz (65 kg)  12/25/21 145 lb 9.6 oz (66 kg)  10/16/21 146 lb 6.4 oz (66.4 kg)    There is no height or weight on file to calculate BMI.  Performance status (ECOG): {CHL ONC [id number]  Physical Exam Vitals and nursing note reviewed.  Constitutional:      General: She is not in acute distress.    Appearance: Normal appearance.  HENT:     Head: Normocephalic and atraumatic.     Mouth/Throat:     Mouth: Mucous membranes are moist.     Pharynx: Oropharynx is clear. No oropharyngeal exudate or posterior oropharyngeal erythema.  Eyes:     General: No scleral icterus.    Extraocular Movements: Extraocular movements intact.     Conjunctiva/sclera: Conjunctivae normal.     Pupils: Pupils are equal, round, and reactive to light.  Cardiovascular:     Rate and Rhythm: Normal rate and regular rhythm.     Heart sounds: Normal heart sounds. No murmur heard.    No friction rub. No gallop.  Pulmonary:     Effort: Pulmonary effort is normal.     Breath sounds: Normal breath sounds. No wheezing, rhonchi or rales.  Abdominal:     General: There is no distension.     Palpations: Abdomen is soft. There is no hepatomegaly, splenomegaly or mass.     Tenderness: There is no abdominal tenderness.  Musculoskeletal:        General: Normal range of motion.     Cervical back: Normal range of motion and neck supple. No tenderness.  Right lower leg: No edema.     Left lower leg: No edema.  Lymphadenopathy:     Cervical: No cervical adenopathy.     Upper Body:     Right upper body: No supraclavicular or axillary adenopathy.     Left upper body: No supraclavicular or axillary adenopathy.     Lower Body: No right inguinal adenopathy. No left inguinal adenopathy.  Skin:    General: Skin is warm and dry.     Coloration: Skin is not jaundiced.     Findings: No rash.  Neurological:     Mental Status: She is alert and oriented to person, place, and time.     Cranial Nerves: No cranial nerve deficit.  Psychiatric:        Mood and Affect: Mood normal.        Behavior: Behavior  normal.        Thought Content: Thought content normal.   LABS:      Latest Ref Rng & Units 10/16/2021   12:00 AM 07/06/2021   12:00 AM 04/27/2021   12:00 AM  CBC  WBC  6.8  4.9  4.4   Hemoglobin 12.0 - 16.0 14.7  14.8  13.9   Hematocrit 36 - 46 42  42  39   Platelets 150 - 399 185  137  135       Latest Ref Rng & Units 10/16/2021   12:00 AM 07/06/2021   12:00 AM 04/27/2021   12:00 AM  CMP  BUN 4 - _0 Creatinine 0.5 - 1.1 0.7  0.8  0.7   Sodium 137 - 147 138  138  138   Potassium 3.4 - 5.3 3.6  3.5  3.5   Chloride 99 - 108 105  105  103   CO2 13 - _1 Calcium 8.7 - 10.7 8.3  9.0  9.1   Alkaline Phos 25 - 125 63  72  58   AST 13 - 35 25  27  33   ALT 7 - 35 _2 No results found for: "CEA1", "CEA" / No results found for: "CEA1", "CEA" No results found for: "PSA1" No results found for: "PYK998" No results found for: "CAN125"  No results found for: "TOTALPROTELP", "ALBUMINELP", "A1GS", "A2GS", "BETS", "BETA2SER", "GAMS", "MSPIKE", "SPEI" No results found for: "TIBC", "FERRITIN", "IRONPCTSAT" No results found for: "LDH"  No results found for: "AFPTUMOR", "TOTALPROTELP", "ALBUMINELP", "A1GS", "A2GS", "BETS", "BETA2SER", "GAMS", "MSPIKE", "SPEI", "LDH", "CEA1", "CEA", "PSA1", "IGASERUM", "IGGSERUM", "IGMSERUM", "THGAB", "THYROGLB"  Review Flowsheet        No data to display           STUDIES:  No results found.    ASSESSMENT & PLAN:   Assessment/Plan:  A 46 y.o. 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   CT chest in June revealed essentially has had a complete response since being on osimertinib since July 2022.    She knows to continue taking her osimertinib on a daily basis for her disease management.  .The patient understands all the plans discussed today and is in agreement with them.      Marvia Pickles, PA-C

## 2022-03-22 NOTE — Telephone Encounter (Signed)
Kelli,PA:  She needs an appt to discuss please. I think I can see her tomorrow if a bunch haven't been added. Thanks    Pt will be here tomorrow @ 2pm.

## 2022-03-23 ENCOUNTER — Ambulatory Visit: Payer: Medicaid Other | Admitting: Hematology and Oncology

## 2022-03-26 ENCOUNTER — Other Ambulatory Visit (HOSPITAL_COMMUNITY): Payer: Self-pay

## 2022-03-27 ENCOUNTER — Other Ambulatory Visit (HOSPITAL_COMMUNITY): Payer: Self-pay

## 2022-04-03 DIAGNOSIS — L700 Acne vulgaris: Secondary | ICD-10-CM | POA: Insufficient documentation

## 2022-04-03 HISTORY — DX: Acne vulgaris: L70.0

## 2022-04-04 ENCOUNTER — Inpatient Hospital Stay: Payer: Medicaid Other

## 2022-04-04 ENCOUNTER — Inpatient Hospital Stay: Payer: Medicaid Other | Attending: Hematology and Oncology | Admitting: Hematology and Oncology

## 2022-04-04 ENCOUNTER — Encounter: Payer: Self-pay | Admitting: Hematology and Oncology

## 2022-04-04 VITALS — BP 137/82 | HR 83 | Temp 98.5°F | Resp 16 | Ht 63.7 in | Wt 140.7 lb

## 2022-04-04 DIAGNOSIS — C3411 Malignant neoplasm of upper lobe, right bronchus or lung: Secondary | ICD-10-CM

## 2022-04-04 DIAGNOSIS — R1010 Upper abdominal pain, unspecified: Secondary | ICD-10-CM

## 2022-04-04 DIAGNOSIS — R197 Diarrhea, unspecified: Secondary | ICD-10-CM

## 2022-04-04 LAB — CBC: RBC: 4.57 (ref 3.87–5.11)

## 2022-04-04 LAB — CBC AND DIFFERENTIAL
HCT: 42 (ref 36–46)
Hemoglobin: 14.5 (ref 12.0–16.0)
Neutrophils Absolute: 2.99
Platelets: 150 10*3/uL (ref 150–400)
WBC: 4.9

## 2022-04-04 LAB — HEPATIC FUNCTION PANEL
ALT: 17 U/L (ref 7–35)
AST: 24 (ref 13–35)
Alkaline Phosphatase: 66 (ref 25–125)
Bilirubin, Total: 0.7

## 2022-04-04 LAB — BASIC METABOLIC PANEL
BUN: 9 (ref 4–21)
CO2: 27 — AB (ref 13–22)
Chloride: 104 (ref 99–108)
Creatinine: 0.7 (ref 0.5–1.1)
Glucose: 91
Potassium: 4 mEq/L (ref 3.5–5.1)
Sodium: 138 (ref 137–147)

## 2022-04-04 LAB — COMPREHENSIVE METABOLIC PANEL
Albumin: 4.4 (ref 3.5–5.0)
Calcium: 9.4 (ref 8.7–10.7)

## 2022-04-04 NOTE — Progress Notes (Cosign Needed)
[facility]  [address] [place],  [place]  [address] [phone]  Clinic Day:  04/04/2022  Referring physician: Allean Found, MD   HISTORY OF PRESENT ILLNESS:  The patient is a 46 y.o. female with with stage IVA (T4 N2 M1a) lung adenocarcinoma.  The stage IVA designation was based upon her having a malignant pleural effusion.  She had no evidence of distant metastasis.  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  At her last visit in June, CT chest did not reveal any evidence of disease.  She did not report pain at that visit, yet continued on OxyContin with oxycodone for breakthrough on a regular basis.    She is added to the schedule today to assess her pain issues, as she requested refills of her medication. She was scheduled with me 2 weeks ago but was unable to make that appointment due to an acute respiratory illness which she attributed to [place].  She did not seek medical attention during that time.  She denies having persistent respiratory issues, such as shortness of breath or chest wall pain, which concern her for disease progression while on osimertinib.  She reports intermittent cramping pain under bilateral lower ribs over the past several months.  This feels like something is moving under her ribs.  She states she has been out of her pain medication since July 16, as she accidentally left her medication at her brother's when she visited.  She reports having nausea, vomiting, diarrhea and cold sweats since that time.  She denies melena or hematochezia she states she saw her primary care provider, Dr. Barrie Dunker, yesterday and he is scheduling her for a colonoscopy.  PHYSICAL EXAM:  Blood pressure 137/82, pulse 83, temperature 98.5 F (36.9 C), temperature source Oral, resp. rate 16, height 5' 3.7" (1.618 m), weight 140 lb 11.2 oz (63.8 kg), SpO2 100 %. Wt Readings from Last 3 Encounters:   04/04/22 140 lb 11.2 oz (63.8 kg)  02/26/22 143 lb 3.2 oz (65 kg)  12/25/21 145 lb 9.6 oz (66 kg)   Body mass index is 24.38 kg/m.  Performance status (ECOG): 1 - Symptomatic but completely ambulatory  Physical Exam Vitals and nursing note reviewed.  Constitutional:      General: She is not in acute distress.    Appearance: Normal appearance.  HENT:     Head: Normocephalic and atraumatic.     Mouth/Throat:     Mouth: Mucous membranes are moist.     Pharynx: Oropharynx is clear. No oropharyngeal exudate or posterior oropharyngeal erythema.  Eyes:     General: No scleral icterus.    Extraocular Movements: Extraocular movements intact.     Conjunctiva/sclera: Conjunctivae normal.     Pupils: Pupils are equal, round, and reactive to light.  Cardiovascular:     Rate and Rhythm: Normal rate and regular rhythm.     Heart sounds: Normal heart sounds. No murmur heard.    No friction rub. No gallop.  Pulmonary:     Effort: Pulmonary effort is normal.     Breath sounds: Normal breath sounds. No wheezing, rhonchi or rales.  Abdominal:     General: There is no distension.     Palpations: Abdomen is soft. There is no hepatomegaly, splenomegaly or mass.     Tenderness: There is no abdominal tenderness.  Musculoskeletal:        General: Normal range of motion.  Cervical back: Normal range of motion and neck supple. No tenderness.     Right lower leg: No edema.     Left lower leg: No edema.  Lymphadenopathy:     Cervical: No cervical adenopathy.     Upper Body:     Right upper body: No supraclavicular or axillary adenopathy.     Left upper body: No supraclavicular or axillary adenopathy.     Lower Body: No right inguinal adenopathy. No left inguinal adenopathy.  Skin:    General: Skin is warm and dry.     Coloration: Skin is not jaundiced.     Findings: No rash.  Neurological:     Mental Status: She is alert and oriented to person, place, and time.     Cranial Nerves: No  cranial nerve deficit.  Psychiatric:        Mood and Affect: Mood normal.        Behavior: Behavior normal.        Thought Content: Thought content normal.     LABS:      Latest Ref Rng & Units 04/04/2022   12:00 AM 10/16/2021   12:00 AM 07/06/2021   12:00 AM  CBC  WBC  4.9     6.8  4.9   Hemoglobin 12.0 - 16.0 14.5     14.7  14.8   Hematocrit 36 - 46 42     42  42   Platelets 150 - 400 K/uL 150     185  137      This result is from an external source.      Latest Ref Rng & Units 04/04/2022   12:00 AM 10/16/2021   12:00 AM 07/06/2021   12:00 AM  CMP  BUN 4 - _0 Creatinine 0.5 - 1.1 0.7     0.7  0.8   Sodium 137 - 147 138     138  138   Potassium 3.5 - 5.1 mEq/L 4.0     3.6  3.5   Chloride 99 - 108 104     105  105   CO2 13 - _1 Calcium 8.7 - 10.7 9.4     8.3  9.0   Alkaline Phos 25 - 125 66     63  72   AST 13 - 35 _2 ALT 7 - 35 U/L _3 This result is from an external source.     No results found for: "CEA1", "CEA" / No results found for: "CEA1", "CEA" No results found for: "PSA1" No results found for: "WUJ811" No results found for: "CAN125"  No results found for: "TOTALPROTELP", "ALBUMINELP", "A1GS", "A2GS", "BETS", "BETA2SER", "GAMS", "MSPIKE", "SPEI" No results found for: "TIBC", "FERRITIN", "IRONPCTSAT" No results found for: "LDH"  No results found for: "AFPTUMOR", "TOTALPROTELP", "ALBUMINELP", "A1GS", "A2GS", "BETS", "BETA2SER", "GAMS", "MSPIKE", "SPEI", "LDH", "CEA1", "CEA", "PSA1", "IGASERUM", "IGGSERUM", "IGMSERUM", "THGAB", "THYROGLB"  Review Flowsheet        No data to display           STUDIES:  No results found.    ASSESSMENT & PLAN:   Assessment/Plan:  A 46 y.o. 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   In clinic today, I reviewed her CT  images with her, for which she could see that she essentially has had a complete response since being on  osimertinib since July 2022.  I feel her pain is unrelated to her cancer and agree with colonoscopy.  Her labs are normal.  I will send her for plain abdominal films today.  As she has not had any pain medication for over 2 weeks, it is unlikely her symptoms are still due to opioid withdrawal.  I do not recommend placing her back on opioids at this time.  I can give her supportive meds to help with her symptoms.  She knows to continue taking her osimertinib on a daily basis for her disease management.  We will see her back in October for repeat clinical assessment, as previously scheduled.  Another chest CT will be done a day before her next visit for her continued radiographic lung cancer surveillance.  The patient understands all the plans discussed today and is in agreement with them.   Marvia Pickles, PA-C

## 2022-04-05 ENCOUNTER — Other Ambulatory Visit: Payer: Self-pay | Admitting: Hematology and Oncology

## 2022-04-05 MED ORDER — MAGIC MOUTHWASH W/LIDOCAINE: 5.0000 mL | Freq: Four times a day (QID) | ORAL | 0 refills | Status: DC | PRN

## 2022-04-05 MED ORDER — ONDANSETRON HCL 4 MG PO TABS
4.0000 mg | ORAL_TABLET | ORAL | 3 refills | Status: DC | PRN
Start: 2022-04-05 — End: 2022-05-25

## 2022-04-06 ENCOUNTER — Encounter: Payer: Self-pay | Admitting: Hematology and Oncology

## 2022-04-13 DIAGNOSIS — C3490 Malignant neoplasm of unspecified part of unspecified bronchus or lung: Secondary | ICD-10-CM | POA: Insufficient documentation

## 2022-04-13 DIAGNOSIS — R569 Unspecified convulsions: Secondary | ICD-10-CM | POA: Insufficient documentation

## 2022-04-13 DIAGNOSIS — F32A Depression, unspecified: Secondary | ICD-10-CM | POA: Insufficient documentation

## 2022-04-13 DIAGNOSIS — F419 Anxiety disorder, unspecified: Secondary | ICD-10-CM | POA: Insufficient documentation

## 2022-04-13 DIAGNOSIS — R002 Palpitations: Secondary | ICD-10-CM | POA: Insufficient documentation

## 2022-04-13 DIAGNOSIS — R079 Chest pain, unspecified: Secondary | ICD-10-CM | POA: Insufficient documentation

## 2022-04-17 ENCOUNTER — Telehealth: Payer: Self-pay

## 2022-04-17 NOTE — Telephone Encounter (Addendum)
Message Received: Today Mosher, Andrea Bloodgood, PA-C  [place] W, RN Caller: Unspecified (Today, 10:12 AM) I was not able to do stool studies as the specimen she gave was not liquid. Her labs from the ER yesterday were normal. Has she set up colonoscopy yet? In regards to rash, can one of [place] see her today?    I spoke with [name]. 1. She reports a new rash on her right shoulder down to elbow. She didn't take Tagrisso last night. 2. She woke up yesterday morning with abdominal pain, swelling, bulging area on right side, so she went to ER (her abdomen was flat on Sunday). 3. ER drew labs and told her to call back for results, as she left ER due to wait times. She states, "there was someone in the waiting room bleeding out". Please advise.

## 2022-04-19 ENCOUNTER — Other Ambulatory Visit (HOSPITAL_COMMUNITY): Payer: Self-pay

## 2022-04-19 NOTE — Telephone Encounter (Signed)
Pt called back to report rash hasn't changed. She states that the abdominal pain has subsided. I told the pt I had tried to call her back numerous times, without an answer. She states her phone was disconnected right after she talked to me the first time. Appt given to see Andrea Holloway, tomorrow @ 330p.  She hasn't gotten colonoscopy scheduled as of yet.

## 2022-04-20 ENCOUNTER — Encounter: Payer: Self-pay | Admitting: Hematology and Oncology

## 2022-04-20 ENCOUNTER — Inpatient Hospital Stay: Payer: Medicaid Other

## 2022-04-20 ENCOUNTER — Other Ambulatory Visit: Payer: Self-pay | Admitting: Hematology and Oncology

## 2022-04-20 ENCOUNTER — Inpatient Hospital Stay (INDEPENDENT_AMBULATORY_CARE_PROVIDER_SITE_OTHER): Payer: Medicaid Other | Admitting: Hematology and Oncology

## 2022-04-20 VITALS — BP 149/84 | HR 99 | Temp 98.0°F | Resp 18 | Ht 63.7 in | Wt 141.2 lb

## 2022-04-20 DIAGNOSIS — R21 Rash and other nonspecific skin eruption: Secondary | ICD-10-CM

## 2022-04-20 MED ORDER — PREDNISONE 10 MG PO TABS
10.0000 mg | ORAL_TABLET | ORAL | 0 refills | Status: DC
Start: 2022-04-20 — End: 2022-05-25

## 2022-04-20 NOTE — Progress Notes (Addendum)
[facility]  [address] [place],  [place]  [address] [phone]  Clinic Day:  04/20/2022  Referring physician: Allean Found, MD   HISTORY OF PRESENT ILLNESS:  The patient is a 46 y.o. female with stage IVA (T4 N2 M1a) lung adenocarcinoma.  The stage IVA designation was based upon her having a malignant pleural effusion.  She had no evidence of distant metastasis.  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  At her visit in June, CT chest did not reveal any evidence of disease.  The upper abdomen also appeared normal.  She did not report pain at that visit, yet continued on OxyContin with oxycodone for breakthrough on a regular basis.    She was seen by me on August 2nd assess her pain issues, as she requested refills of her medication.  At that time, she has been out of her pain medication for 2 weeks.  She reported intermittent cramping pain under bilateral lower ribs over the past several months, which felt like something is moving under her ribs.  She also reported nausea and diarrhea.  She had seen her primary care provider, who is referring her to GI for colonoscopy.  I did not recommend placing her back on opioids at that time.  We tried to put pain stool for evaluation, but she did not have liquid stool at that time.  I did provide her antiemetic.  She is added to the schedule today as she called reporting a rash of her right arm for the past few days.  She went to the emergency room with abdominal and back pain on August 14 and had a CBC and CMP done which were normal.  She did not need to see the emergency room provider.  She now reports the rash is on both thighs.  She also reports swelling of the right flank, that has resolved.  She denies having a tick bite.  She denies fevers, chills or sore throat.  She held her osimertinib for 1 day, but then it back.  She denies having persistent  respiratory issues, such as shortness of breath or chest wall pain, which concern her for disease progression while on osimertinib.    PHYSICAL EXAM:  Blood pressure (!) 149/84, pulse 99, temperature 98 F (36.7 C), temperature source Oral, resp. rate 18, height 5' 3.7" (1.618 m), weight 141 lb 3.2 oz (64 kg), SpO2 97 %. Wt Readings from Last 3 Encounters:  04/20/22 141 lb 3.2 oz (64 kg)  04/04/22 140 lb 11.2 oz (63.8 kg)  02/26/22 143 lb 3.2 oz (65 kg)   Body mass index is 24.47 kg/m.  Performance status (ECOG): 1 - Symptomatic but completely ambulatory  Physical Exam Vitals and nursing note reviewed.  Constitutional:      General: She is not in acute distress.    Appearance: Normal appearance.  HENT:     Head: Normocephalic and atraumatic.     Mouth/Throat:     Mouth: Mucous membranes are moist.     Pharynx: Oropharynx is clear. No oropharyngeal exudate or posterior oropharyngeal erythema.  Eyes:     General: No scleral icterus.    Extraocular Movements: Extraocular movements intact.     Conjunctiva/sclera: Conjunctivae normal.     Pupils: Pupils are equal, round, and reactive to light.  Cardiovascular:     Rate and Rhythm: Normal rate and regular rhythm.     Heart sounds:  Normal heart sounds. No murmur heard.    No friction rub. No gallop.  Pulmonary:     Effort: Pulmonary effort is normal.     Breath sounds: Normal breath sounds. No wheezing, rhonchi or rales.  Abdominal:     General: There is no distension.     Palpations: Abdomen is soft. There is no hepatomegaly or mass.     Tenderness: There is no abdominal tenderness. There is no right CVA tenderness or left CVA tenderness.     Comments: There is no swelling of the bilateral flanks  Musculoskeletal:        General: Normal range of motion.     Cervical back: Normal range of motion and neck supple. No tenderness.     Right lower leg: No edema.     Left lower leg: No edema.  Lymphadenopathy:     Cervical: No  cervical adenopathy.     Upper Body:     Right upper body: No supraclavicular or axillary adenopathy.     Left upper body: No supraclavicular or axillary adenopathy.  Skin:    General: Skin is warm and dry.     Coloration: Skin is not jaundiced.     Findings: No rash.  Neurological:     Mental Status: She is alert and oriented to person, place, and time.     Cranial Nerves: No cranial nerve deficit.  Psychiatric:        Mood and Affect: Mood normal.        Behavior: Behavior normal.        Thought Content: Thought content normal.    LABS:      Latest Ref Rng & Units 04/04/2022   12:00 AM 10/16/2021   12:00 AM 07/06/2021   12:00 AM  CBC  WBC  4.9     6.8  4.9   Hemoglobin 12.0 - 16.0 14.5     14.7  14.8   Hematocrit 36 - 46 42     42  42   Platelets 150 - 400 K/uL 150     185  137      This result is from an external source.      Latest Ref Rng & Units 04/04/2022   12:00 AM 10/16/2021   12:00 AM 07/06/2021   12:00 AM  CMP  BUN 4 - _0 Creatinine 0.5 - 1.1 0.7     0.7  0.8   Sodium 137 - 147 138     138  138   Potassium 3.5 - 5.1 mEq/L 4.0     3.6  3.5   Chloride 99 - 108 104     105  105   CO2 13 - _1 Calcium 8.7 - 10.7 9.4     8.3  9.0   Alkaline Phos 25 - 125 66     63  72   AST 13 - 35 _2 ALT 7 - 35 U/L _3 This result is from an external source.     No results found for: "CEA1", "CEA" / No results found for: "CEA1", "CEA" No results found for: "PSA1" No results found for: "FAO130" No results found for: "CAN125"  No results found for: "TOTALPROTELP", "ALBUMINELP", "A1GS", "A2GS", "BETS", "BETA2SER", "GAMS", "MSPIKE", "SPEI"  No results found for: "TIBC", "FERRITIN", "IRONPCTSAT" No results found for: "LDH"  No results found for: "AFPTUMOR", "TOTALPROTELP", "ALBUMINELP", "A1GS", "A2GS", "BETS", "BETA2SER", "GAMS", "MSPIKE", "SPEI", "LDH", "CEA1", "CEA", "PSA1", "IGASERUM", "IGGSERUM", "IGMSERUM", "THGAB",  "THYROGLB"  Review Flowsheet        No data to display           STUDIES:  No results found.    ASSESSMENT & PLAN:   Assessment/Plan:  A 46 y.o. 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   She has a rash of uncertain etiology.  It is suspicious for disseminated strep, so we will obtain a rapid strep today.  If this is negative, we will do a prednisone taper.  She knows to continue taking her osimertinib on a daily basis for her disease management.  We will see her back in October for repeat clinical assessment, as previously scheduled.  Another chest CT will be done a day before her next visit for her continued radiographic lung cancer surveillance.  The patient understands all the plans discussed today and is in agreement with them.   Marvia Pickles, PA-C

## 2022-04-23 IMAGING — CR DG CHEST 1V PORT
1 series · 1 of 1 positions shown · non-contrast
Comparison: Radiographs 11/29/2020 and 11/18/2020.  CT 11/29/2020.

CLINICAL DATA: Progressive cough and back pain.

EXAM:
PORTABLE CHEST 1 VIEW

[w chest pa]
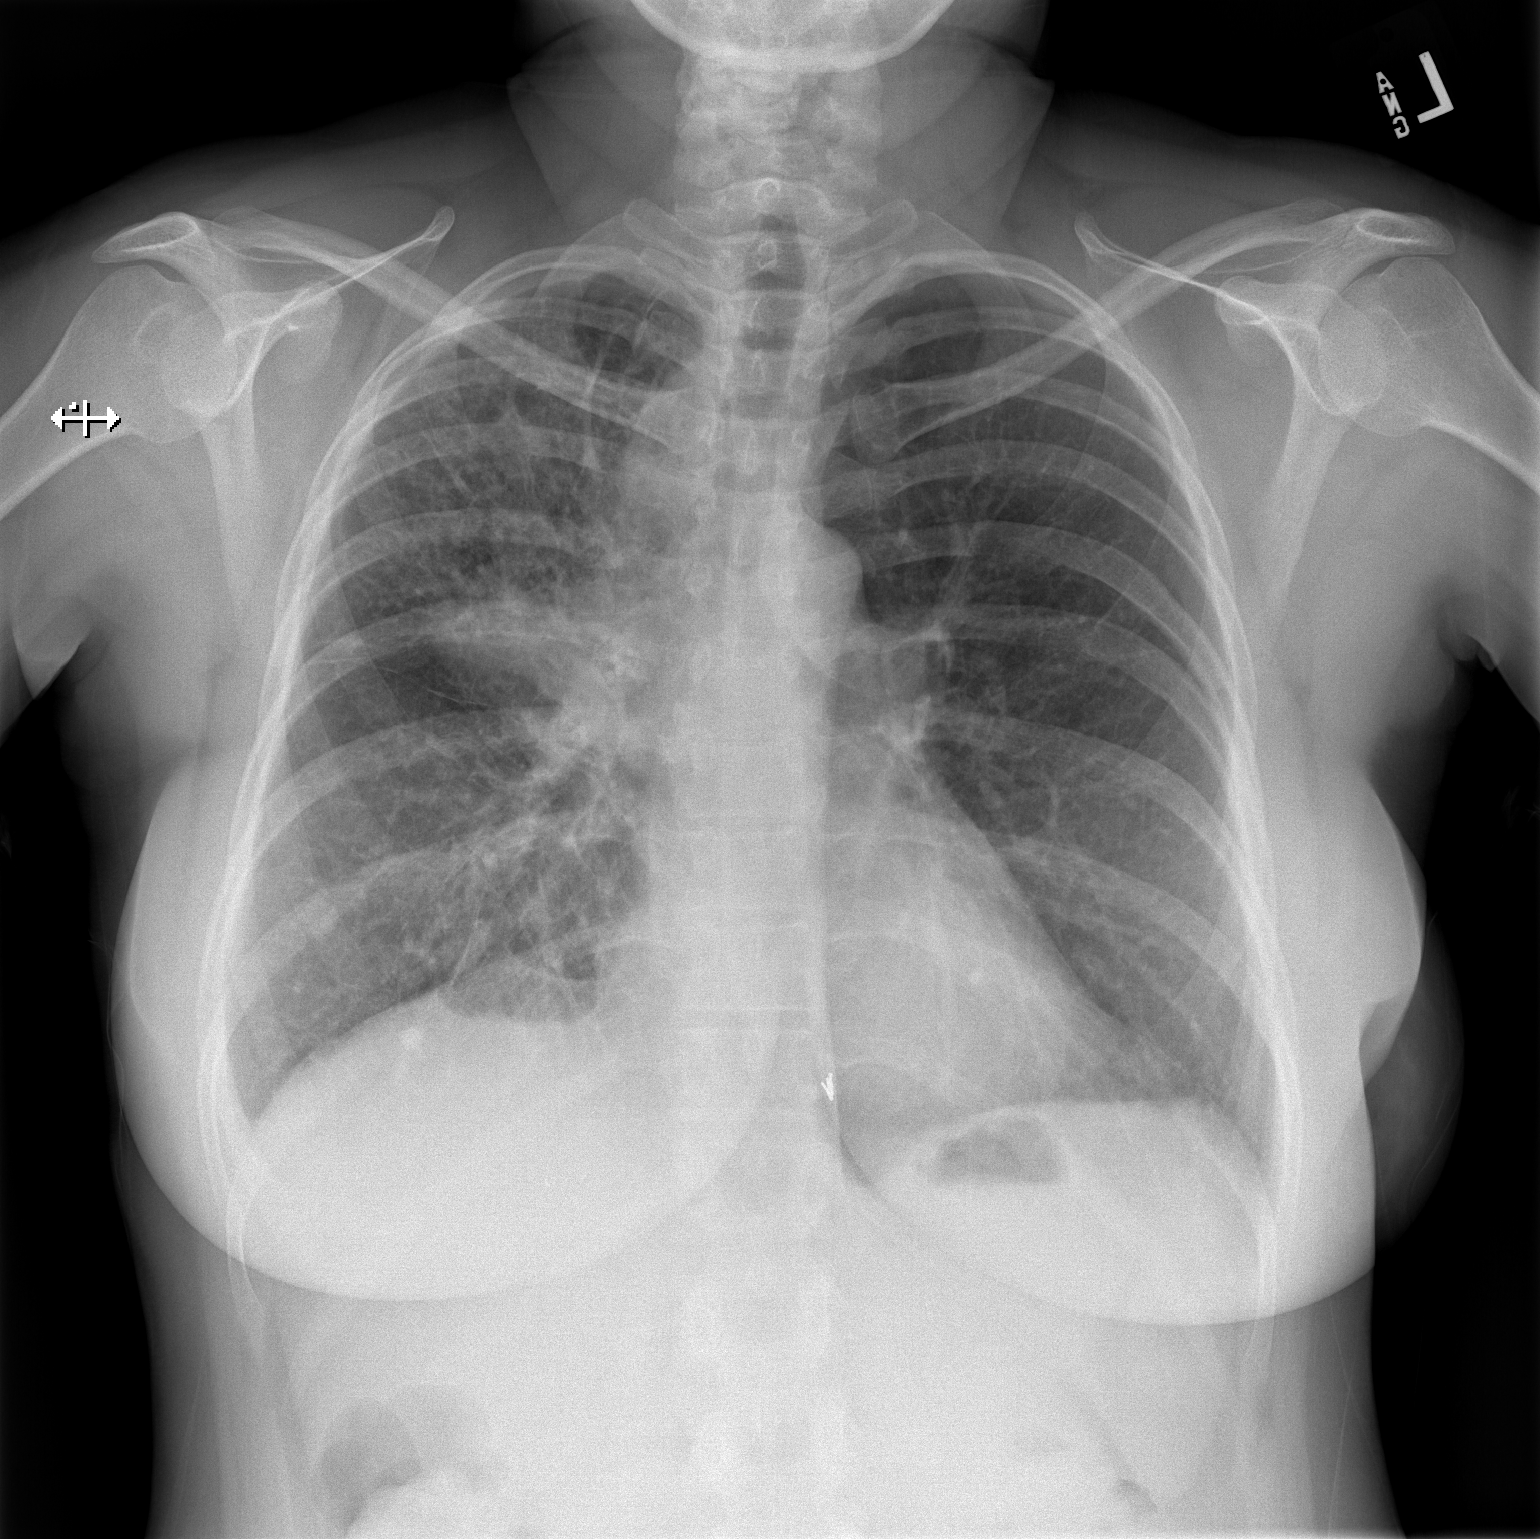

[1 of 1 positions shown; findings below may reference images not displayed]

FINDINGS: 5551 hours. The heart size and mediastinal contours are stable with
prominent right hilar and paratracheal soft tissue prominence.
Underlying calcified right hilar and mediastinal lymph nodes are
present. There is persistent consolidation and volume loss
anteriorly in the right upper lobe, similar to recent prior studies.
A small sub pulmonic right pleural effusion appears unchanged. The
left lung is clear. A small metallic foreign body in the left
retrocrural area appears unchanged. The bones are stable.
IMPRESSION: Stable appearance of the chest with persistent mass-like right upper
lobe consolidation suspicious for underlying malignancy as reported
on recent CT. No acute superimposed findings identified.

## 2022-04-24 ENCOUNTER — Other Ambulatory Visit: Payer: Self-pay | Admitting: Oncology

## 2022-05-01 ENCOUNTER — Encounter: Payer: Self-pay | Admitting: Cardiology

## 2022-05-01 ENCOUNTER — Other Ambulatory Visit (HOSPITAL_COMMUNITY): Payer: Self-pay

## 2022-05-01 ENCOUNTER — Ambulatory Visit: Payer: Medicaid Other | Attending: Cardiology | Admitting: Cardiology

## 2022-05-01 VITALS — BP 105/71 | HR 84 | Ht 64.0 in | Wt 140.4 lb

## 2022-05-01 DIAGNOSIS — C3411 Malignant neoplasm of upper lobe, right bronchus or lung: Secondary | ICD-10-CM

## 2022-05-01 DIAGNOSIS — R079 Chest pain, unspecified: Secondary | ICD-10-CM | POA: Diagnosis not present

## 2022-05-01 DIAGNOSIS — F17210 Nicotine dependence, cigarettes, uncomplicated: Secondary | ICD-10-CM

## 2022-05-01 DIAGNOSIS — R011 Cardiac murmur, unspecified: Secondary | ICD-10-CM

## 2022-05-01 DIAGNOSIS — F17200 Nicotine dependence, unspecified, uncomplicated: Secondary | ICD-10-CM

## 2022-05-01 HISTORY — DX: Cardiac murmur, unspecified: R01.1

## 2022-05-01 MED ORDER — NITROGLYCERIN 0.4 MG SL SUBL: 0.4000 mg | SUBLINGUAL_TABLET | SUBLINGUAL | 6 refills | Status: DC | PRN

## 2022-05-01 NOTE — Progress Notes (Signed)
Cardiology Office Note:    Date:  05/01/2022   ID:  Andrea Holloway, DOB 1976/01/18, MRN [id number]  PCP:  Allean Found, MD  Cardiologist:  Jenean Lindau, MD   Referring MD: Allean Found, MD    ASSESSMENT:    1. Chest pain, unspecified type   2. Malignant neoplasm of upper lobe of right lung (HCC)   3. Current smoker   4. Cardiac murmur    PLAN:    In order of problems listed above:  Primary prevention stressed with the patient.  Importance of compliance with diet and medication stressed and she vocalized understanding. Chest pain: Atypical in nature however would make an evaluation.  She has multiple risk factors for coronary artery disease.  I discussed exercise stress Cardiolite and she is agreeable.  Sublingual nitroglycerin prescription was sent, its protocol and 911 protocol explained and the patient vocalized understanding questions were answered to the patient's satisfaction.  Lipids are followed by primary care. Cigarette smoker: I spent 5 minutes with the patient discussing solely about smoking. Smoking cessation was counseled. I suggested to the patient also different medications and pharmacological interventions. Patient is keen to try stopping on its own at this time. He will get back to me if he needs any further assistance in this matter. Cardiac murmur: Echocardiogram will be done to assess murmur heard on auscultation Patient will be seen in follow-up appointment in 6 months or earlier if the patient has any concerns    Medication Adjustments/Labs and Tests Ordered: Current medicines are reviewed at length with the patient today.  Concerns regarding medicines are outlined above.  No orders of the defined types were placed in this encounter.  No orders of the defined types were placed in this encounter.    History of Present Illness:    Andrea Holloway is a 46 y.o. female who is being seen today for the evaluation of chest pain at the request of Allean Found,  MD. patient is a pleasant 46 year old female.  She has past medical history of lung cancer.  Unfortunately she continues to smoke.  At the time of my evaluation, the patient is alert awake oriented and in no distress.  Patient mentions to me that she has chest tightness at times.  She is going to the hospital for this.  No orthopnea or PND.  Past Medical History:  Diagnosis Date   Acne vulgaris 04/03/2022   Adenocarcinoma of lung, stage 4 ([facility]) 01/10/2022   Anxiety    Chest pain    Depression    Lung cancer ([facility])    Malignant neoplasm of upper lobe of right lung ([facility]) 02/03/2021   Palpitations    Seizures ([facility])     Past Surgical History:  Procedure Laterality Date   CARDIAC SURGERY     SAC OF FLUID BEHIND HEART @ 46 YEARS OF AGE   MYRINGOTOMY WITH TUBE PLACEMENT Bilateral    TUBAL LIGATION      Current Medications: Current Meds  Medication Sig   osimertinib mesylate (TAGRISSO) 80 MG tablet Take 1 tablet (80 mg total) by mouth daily.     Allergies:   Iodine   Social History   Socioeconomic History   Marital status: Divorced    Spouse name: Not on file   Number of children: 3   Years of education: 9   Highest education level: Not on file  Occupational History   Occupation: UNEMPLOYED  Tobacco Use   Smoking status: Every Day    Types:  Cigarettes   Smokeless tobacco: Never  Substance and Sexual Activity   Alcohol use: Not Currently    Comment: OCCASIONAL   Drug use: Yes    Types: Marijuana   Sexual activity: Not Currently  Other Topics Concern   Not on file  Social History Narrative   Not on file   Social Determinants of Health   Financial Resource Strain: Not on file  Food Insecurity: Not on file  Transportation Needs: Not on file  Physical Activity: Not on file  Stress: Not on file  Social Connections: Not on file     Family History: The patient's family history includes Brain cancer in her cousin; Breast cancer (age of onset: 49) in her maternal aunt; Colon  cancer in her cousin, cousin, and cousin; Hodgkin's lymphoma (age of onset: 36) in her sister; Lung cancer (age of onset: 20) in her cousin; Penile cancer (age of onset: 4) in her maternal grandfather; Prostate cancer (age of onset: 106) in her maternal grandfather; Thyroid cancer (age of onset: 54) in her maternal grandmother.  ROS:   Please see the history of present illness.    All other systems reviewed and are negative.  EKGs/Labs/Other Studies Reviewed:    The following studies were reviewed today: EKG reveals sinus rhythm and nonspecific ST-T changes   Recent Labs: 04/04/2022: ALT 17; BUN 9; Creatinine 0.7; Hemoglobin 14.5; Platelets 150; Potassium 4.0; Sodium 138  Recent Lipid Panel No results found for: "CHOL", "TRIG", "HDL", "CHOLHDL", "VLDL", "LDLCALC", "LDLDIRECT"  Physical Exam:    VS:  BP 105/71   Pulse 84   Ht 5\' 4"  (1.626 m)   Wt 140 lb 6.4 oz (63.7 kg)   SpO2 99%   BMI 24.10 kg/m     Wt Readings from Last 3 Encounters:  05/01/22 140 lb 6.4 oz (63.7 kg)  04/20/22 141 lb 3.2 oz (64 kg)  04/04/22 140 lb 11.2 oz (63.8 kg)     GEN: Patient is in no acute distress HEENT: Normal NECK: No JVD; No carotid bruits LYMPHATICS: No lymphadenopathy CARDIAC: S1 S2 regular, 2/6 systolic murmur at the apex. RESPIRATORY:  Clear to auscultation without rales, wheezing or rhonchi  ABDOMEN: Soft, non-tender, non-distended MUSCULOSKELETAL:  No edema; No deformity  SKIN: Warm and dry NEUROLOGIC:  Alert and oriented x 3 PSYCHIATRIC:  Normal affect    Signed, Jenean Lindau, MD  05/01/2022 8:59 AM    [facility]

## 2022-05-01 NOTE — Addendum Note (Signed)
Addended by: Truddie Hidden on: 05/01/2022 09:40 AM   Modules accepted: Orders

## 2022-05-01 NOTE — Patient Instructions (Signed)

## 2022-05-02 ENCOUNTER — Telehealth: Payer: Self-pay | Admitting: *Deleted

## 2022-05-02 NOTE — Telephone Encounter (Signed)
Patient given detailed instructions per Myocardial Perfusion Study Information Sheet for the test on 05/08/22 @1100 .Patient notified to arrive 15 minutes early and that it is imperative to arrive on time for appointment to keep from having the test rescheduled.  If you need to cancel or reschedule your appointment, please call the office within 24 hours of your appointment. . Patient verbalized understanding.Smera Guyette, [place]anae [place]ms

## 2022-05-03 ENCOUNTER — Ambulatory Visit: Payer: Medicaid Other | Attending: Cardiology

## 2022-05-08 ENCOUNTER — Ambulatory Visit: Payer: Medicaid Other

## 2022-05-08 ENCOUNTER — Telehealth: Payer: Self-pay | Admitting: *Deleted

## 2022-05-08 NOTE — Telephone Encounter (Signed)
Left message on voicemail per DPR in reference to upcoming appointment scheduled on 05/15/22 at 1100 with detailed instructions given per Myocardial Perfusion Study Information Sheet for the test. LM to arrive 15 minutes early, and that it is imperative to arrive on time for appointment to keep from having the test rescheduled. If you need to cancel or reschedule your appointment, please call the office within 24 hours of your appointment. Failure to do so may result in a cancellation of your appointment, and a $50 no show fee. Phone number given for call back for any questions.  Coletta Lockner, Ranae Palms

## 2022-05-11 ENCOUNTER — Telehealth: Payer: Self-pay

## 2022-05-11 NOTE — Telephone Encounter (Signed)
Pt asking if she can have port removed?

## 2022-05-15 ENCOUNTER — Ambulatory Visit: Payer: Medicaid Other | Attending: Cardiology

## 2022-05-15 DIAGNOSIS — R079 Chest pain, unspecified: Secondary | ICD-10-CM | POA: Diagnosis not present

## 2022-05-15 LAB — MYOCARDIAL PERFUSION IMAGING
LV dias vol: 58 mL (ref 46–106)
LV sys vol: 20 mL
Nuc Stress EF: 65 %
Peak HR: 121 {beats}/min
Rest HR: 73 {beats}/min
Rest Nuclear Isotope Dose: 10.8 mCi
SDS: 1
SRS: 0
SSS: 1
Stress Nuclear Isotope Dose: 29.5 mCi
TID: 1.04

## 2022-05-15 MED ORDER — AMINOPHYLLINE 25 MG/ML IV SOLN
75.0000 mg | Freq: Once | INTRAVENOUS | Status: AC
  Administered 2022-05-15: 75 mg via INTRAVENOUS

## 2022-05-15 MED ORDER — TECHNETIUM TC 99M TETROFOSMIN IV KIT
10.8000 | PACK | Freq: Once | INTRAVENOUS | Status: AC | PRN
  Administered 2022-05-15: 10.8 via INTRAVENOUS

## 2022-05-15 MED ORDER — TECHNETIUM TC 99M TETROFOSMIN IV KIT
29.5000 | PACK | Freq: Once | INTRAVENOUS | Status: AC | PRN
  Administered 2022-05-15: 29.5 via INTRAVENOUS

## 2022-05-15 MED ORDER — REGADENOSON 0.4 MG/5ML IV SOLN
0.4000 mg | Freq: Once | INTRAVENOUS | Status: AC
  Administered 2022-05-15: 0.4 mg via INTRAVENOUS

## 2022-05-17 ENCOUNTER — Inpatient Hospital Stay: Payer: Medicaid Other

## 2022-05-21 ENCOUNTER — Other Ambulatory Visit (HOSPITAL_COMMUNITY): Payer: Self-pay

## 2022-05-21 ENCOUNTER — Other Ambulatory Visit: Payer: Self-pay | Admitting: Hematology and Oncology

## 2022-05-22 ENCOUNTER — Other Ambulatory Visit (HOSPITAL_COMMUNITY): Payer: Self-pay

## 2022-05-22 MED ORDER — OSIMERTINIB MESYLATE 80 MG PO TABS
80.0000 mg | ORAL_TABLET | Freq: Every day | ORAL | 3 refills | Status: DC
  Filled 2022-05-22: qty 30, 30d supply, fill #0
  Filled 2022-06-20: qty 30, 30d supply, fill #1
  Filled 2022-07-25: qty 30, 30d supply, fill #2
  Filled 2022-08-21: qty 30, 30d supply, fill #3

## 2022-05-28 ENCOUNTER — Other Ambulatory Visit (HOSPITAL_COMMUNITY): Payer: Self-pay

## 2022-06-04 ENCOUNTER — Other Ambulatory Visit (HOSPITAL_COMMUNITY): Payer: Self-pay

## 2022-06-07 ENCOUNTER — Other Ambulatory Visit (HOSPITAL_COMMUNITY): Payer: Self-pay

## 2022-06-08 ENCOUNTER — Telehealth: Payer: Self-pay | Admitting: Oncology

## 2022-06-18 ENCOUNTER — Other Ambulatory Visit (HOSPITAL_COMMUNITY): Payer: Self-pay

## 2022-06-20 ENCOUNTER — Other Ambulatory Visit (HOSPITAL_COMMUNITY): Payer: Self-pay

## 2022-06-25 ENCOUNTER — Other Ambulatory Visit (HOSPITAL_COMMUNITY): Payer: Self-pay

## 2022-06-26 ENCOUNTER — Other Ambulatory Visit (HOSPITAL_COMMUNITY): Payer: Self-pay

## 2022-06-27 ENCOUNTER — Telehealth: Payer: Self-pay

## 2022-06-27 ENCOUNTER — Other Ambulatory Visit (HOSPITAL_COMMUNITY): Payer: Self-pay

## 2022-06-27 LAB — HEPATIC FUNCTION PANEL
ALT: 16 U/L (ref 7–35)
AST: 25 (ref 13–35)
Alkaline Phosphatase: 68 (ref 25–125)
Bilirubin, Total: 0.7

## 2022-06-27 LAB — BASIC METABOLIC PANEL
BUN: 12 (ref 4–21)
CO2: 29 — AB (ref 13–22)
Chloride: 106 (ref 99–108)
Creatinine: 0.8 (ref 0.5–1.1)
Glucose: 97
Potassium: 4.7 mEq/L (ref 3.5–5.1)
Sodium: 137 (ref 137–147)

## 2022-06-27 LAB — CBC: RBC: 4.64 (ref 3.87–5.11)

## 2022-06-27 LAB — CBC AND DIFFERENTIAL
HCT: 43 (ref 36–46)
Hemoglobin: 15.1 (ref 12.0–16.0)
Neutrophils Absolute: 8.22
Platelets: 146 10*3/uL — AB (ref 150–400)
WBC: 10.4

## 2022-06-27 LAB — COMPREHENSIVE METABOLIC PANEL
Albumin: 4.6 (ref 3.5–5.0)
Calcium: 9.6 (ref 8.7–10.7)

## 2022-06-27 NOTE — Telephone Encounter (Signed)
Pt arrived to CT department for CT scan w/contrast. Pt is allergic to contrast and did not take any premedication. Andrea Holloway from [facility] called to ask if Dr Bobby Rumpf wanted to do the scan without contrast or reschedule. Dr Bobby Rumpf is in agreement to proceed with CT without contrast.

## 2022-06-27 NOTE — Progress Notes (Deleted)
[facility]  [address] [place],  [place]  [address] [phone]  Clinic Day:  06/27/2022  Referring physician: Allean Found, MD  HISTORY OF PRESENT ILLNESS:  The patient is a 46 y.o. female with stage IVA (T4 N2 M1a) lung adenocarcinoma.  The stage IVA designation was based upon her having a malignant pleural effusion.  She had no evidence of distant metastasis.  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  The patient comes in today to review her most recent chest CT.  Overall, the patient claims to be doing fine.  She denies having any respiratory issues, such as shortness of breath or chest wall pain, which concern her for disease progression while on osimertinib.    PHYSICAL EXAM:  There were no vitals taken for this visit. Wt Readings from Last 3 Encounters:  05/15/22 140 lb (63.5 kg)  05/01/22 140 lb 6.4 oz (63.7 kg)  04/20/22 141 lb 3.2 oz (64 kg)   There is no height or weight on file to calculate BMI. Performance status (ECOG): 1 - Symptomatic but completely ambulatory Physical Exam Constitutional:      Appearance: Normal appearance. She is not ill-appearing.  HENT:     Mouth/Throat:     Mouth: Mucous membranes are moist.     Pharynx: Oropharynx is clear. No oropharyngeal exudate or posterior oropharyngeal erythema.  Cardiovascular:     Rate and Rhythm: Normal rate and regular rhythm.     Heart sounds: No murmur heard.    No friction rub. No gallop.  Pulmonary:     Effort: Pulmonary effort is normal. No respiratory distress.     Breath sounds: Normal breath sounds. No wheezing, rhonchi or rales.  Abdominal:     General: Bowel sounds are normal. There is no distension.     Palpations: Abdomen is soft. There is no mass.     Tenderness: There is no abdominal tenderness.  Musculoskeletal:        General: No swelling.     Right lower leg: No edema.     Left lower leg: No  edema.  Lymphadenopathy:     Cervical: No cervical adenopathy.     Upper Body:     Right upper body: No supraclavicular or axillary adenopathy.     Left upper body: No supraclavicular or axillary adenopathy.     Lower Body: No right inguinal adenopathy. No left inguinal adenopathy.  Skin:    General: Skin is warm.     Coloration: Skin is not jaundiced.     Findings: No lesion or rash.  Neurological:     General: No focal deficit present.     Mental Status: She is alert and oriented to person, place, and time. Mental status is at baseline.  Psychiatric:        Mood and Affect: Mood normal.        Behavior: Behavior normal.        Thought Content: Thought content normal.    SCANS: Her recent CT chest revealed the following:   ASSESSMENT & PLAN:  Assessment/Plan:  A 46 y.o. 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   In clinic today, I reviewed her CT images with her, for which she could see that she essentially has had a complete response since being on osimertinib since July 2022.  Clinically, she continues to do very well.  She knows  to continue taking her osimertinib on a daily basis for her disease management.  I will see her back in 4 months for repeat clinical assessment.  Another chest CT will be done a day before her next visit for her continued radiographic lung cancer surveillance.  The patient understands all the plans discussed today and is in agreement with them.    Lucielle Vokes Macarthur Critchley, MD

## 2022-06-28 ENCOUNTER — Ambulatory Visit: Payer: Medicaid Other | Admitting: Oncology

## 2022-07-02 NOTE — Progress Notes (Signed)
Andrea Holloway  [address] [place],  [place]  [address] [phone]  Clinic Day:  07/03/2022  Referring physician: Allean Found, MD  HISTORY OF PRESENT ILLNESS:  The patient is a 46 y.o. female with stage IVA (T4 N2 M1a) lung adenocarcinoma.  The stage IVA designation was based upon her initially having a malignant pleural effusion.  She had no evidence of distant metastasis.  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  The patient comes in today to review her most recent chest CT.  Overall, the patient claims to be doing fine.  Other than an occasional cough, she denies having any respiratory issues, such as shortness of breath or chest wall pain, which concern her for disease progression while on osimertinib.  Unfortunately, despite many discussions in the past about the importance of smoking abstinence, she continues to smoke on a daily basis.   PHYSICAL EXAM:  Blood pressure (!) 115/57, pulse 87, temperature 98.3 F (36.8 C), resp. rate 16, height _0  (1.626 m), weight 139 lb 11.2 oz (63.4 kg), SpO2 96 %. Wt Readings from Last 3 Encounters:  07/03/22 139 lb 11.2 oz (63.4 kg)  05/15/22 140 lb (63.5 kg)  05/01/22 140 lb 6.4 oz (63.7 kg)   Body mass index is 23.98 kg/m. Performance status (ECOG): 1 - Symptomatic but completely ambulatory Physical Exam Constitutional:      Appearance: Normal appearance. She is not ill-appearing.  HENT:     Mouth/Throat:     Mouth: Mucous membranes are moist.     Pharynx: Oropharynx is clear. No oropharyngeal exudate or posterior oropharyngeal erythema.  Cardiovascular:     Rate and Rhythm: Normal rate and regular rhythm.     Heart sounds: No murmur heard.    No friction rub. No gallop.  Pulmonary:     Effort: Pulmonary effort is normal. No respiratory distress.     Breath sounds: Normal breath sounds. No wheezing, rhonchi or rales.  Abdominal:      General: Bowel sounds are normal. There is no distension.     Palpations: Abdomen is soft. There is no mass.     Tenderness: There is no abdominal tenderness.  Musculoskeletal:        General: No swelling.     Right lower leg: No edema.     Left lower leg: No edema.  Lymphadenopathy:     Cervical: No cervical adenopathy.     Upper Body:     Right upper body: No supraclavicular or axillary adenopathy.     Left upper body: No supraclavicular or axillary adenopathy.     Lower Body: No right inguinal adenopathy. No left inguinal adenopathy.  Skin:    General: Skin is warm.     Coloration: Skin is not jaundiced.     Findings: No lesion or rash.  Neurological:     General: No focal deficit present.     Mental Status: She is alert and oriented to person, place, and time. Mental status is at baseline.  Psychiatric:        Mood and Affect: Mood normal.        Behavior: Behavior normal.        Thought Content: Thought content normal.    SCANS: Her recent CT chest revealed the following: FINDINGS: Cardiovascular: Right chest Port-A-Cath with tip at the superior cavoatrial junction. Normal caliber thoracic aorta. Normal size heart. No significant pericardial effusion/thickening.  Mediastinum/Nodes: No supraclavicular adenopathy. No suspicious thyroid nodule. No pathologically enlarged mediastinal, hilar or axillary lymph nodes, noting limited sensitivity for the detection of hilar adenopathy on this noncontrast study. Esophagus is grossly unremarkable.  Lungs/Pleura: Stable scarring in the inferior medial right upper lobe. No suspicious pulmonary nodules or masses. Anterior right lower lobe calcified granuloma. Centrilobular emphysema. No pleural effusion or pneumothorax.  Upper Abdomen: No acute abnormality.  Musculoskeletal: No aggressive lytic or blastic lesion of bone. Similar appearance of the sclerotic osseous focus along the inferior left lateral T6 vertebral body at the  costovertebral joint unchanged dating back to at least 2022 CTA chest and not hypermetabolic on prior PET examination compatible with a benign/indolent process.  IMPRESSION: 1. Stable examination without evidence of local recurrence or metastatic disease within the chest. 2. Similar volume loss and scarring in the inferomedial right upper lobe. 3. Aortic Atherosclerosis (ICD10-I70.0).  ASSESSMENT & PLAN:  Assessment/Plan:  A 46 y.o. 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   In clinic today, I reviewed her CT images with her, for which she could see that she essentially remains in a complete response since being on osimertinib since July 2022.  Clinically, she continues to do very well.  She knows to continue taking her osimertinib on a daily basis for her disease management.  I will see her back in 4 months for repeat clinical assessment.  Another chest CT will be done a day before her next visit for her continued radiographic lung cancer surveillance.  The patient understands all the plans discussed today and is in agreement with them.    Karianne Nogueira Macarthur Critchley, MD

## 2022-07-03 ENCOUNTER — Inpatient Hospital Stay: Payer: Medicaid Other | Attending: Hematology and Oncology | Admitting: Oncology

## 2022-07-03 ENCOUNTER — Other Ambulatory Visit: Payer: Self-pay | Admitting: Oncology

## 2022-07-03 ENCOUNTER — Other Ambulatory Visit (HOSPITAL_COMMUNITY): Payer: Self-pay

## 2022-07-03 VITALS — BP 115/57 | HR 87 | Temp 98.3°F | Resp 16 | Ht 64.0 in | Wt 139.7 lb

## 2022-07-03 DIAGNOSIS — C3411 Malignant neoplasm of upper lobe, right bronchus or lung: Secondary | ICD-10-CM

## 2022-07-11 ENCOUNTER — Other Ambulatory Visit (HOSPITAL_COMMUNITY): Payer: Self-pay

## 2022-07-23 ENCOUNTER — Other Ambulatory Visit (HOSPITAL_COMMUNITY): Payer: Self-pay

## 2022-07-25 ENCOUNTER — Other Ambulatory Visit (HOSPITAL_COMMUNITY): Payer: Self-pay

## 2022-08-01 ENCOUNTER — Other Ambulatory Visit (HOSPITAL_COMMUNITY): Payer: Self-pay

## 2022-08-21 ENCOUNTER — Other Ambulatory Visit: Payer: Self-pay

## 2022-08-21 ENCOUNTER — Other Ambulatory Visit (HOSPITAL_COMMUNITY): Payer: Self-pay

## 2022-08-28 ENCOUNTER — Other Ambulatory Visit: Payer: Self-pay

## 2022-09-07 ENCOUNTER — Telehealth: Payer: Self-pay

## 2022-09-07 NOTE — Telephone Encounter (Signed)
Pt called to ask if she could have her scan and be seen sooner than appt scheduled in March 2024? Pt reports she has been sick for the last 2 weeks with chronic cough, increased SOB, & occasional productive-thick yellow phlegm. She admits to fever, chills, sore throat, vomiting, & diarrhea. What concerns her the most is she is having a new burning sensation in her chest, and wants to make sure her cancer hasn't returned. She is still smoking. She is taking the Tagrisso as ordered, missing 2 doses in the last week. Pt hasn't seen her PCP, as she doesn't like that office and plans to get another PCP. Please advise.

## 2022-09-07 NOTE — Telephone Encounter (Signed)
Dr Bobby Rumpf: I don't see a role for her chest CT needing to be moved up. I would do a CXR first. She needs to STOP SMOKING.

## 2022-09-14 ENCOUNTER — Other Ambulatory Visit (HOSPITAL_COMMUNITY): Payer: Self-pay

## 2022-09-20 ENCOUNTER — Other Ambulatory Visit (HOSPITAL_COMMUNITY): Payer: Self-pay

## 2022-09-20 ENCOUNTER — Other Ambulatory Visit: Payer: Self-pay | Admitting: Oncology

## 2022-09-21 ENCOUNTER — Other Ambulatory Visit (HOSPITAL_COMMUNITY): Payer: Self-pay

## 2022-09-24 ENCOUNTER — Other Ambulatory Visit: Payer: Self-pay

## 2022-09-25 ENCOUNTER — Other Ambulatory Visit (HOSPITAL_COMMUNITY): Payer: Self-pay

## 2022-09-25 ENCOUNTER — Other Ambulatory Visit: Payer: Self-pay | Admitting: Oncology

## 2022-09-26 ENCOUNTER — Other Ambulatory Visit: Payer: Self-pay

## 2022-09-26 ENCOUNTER — Other Ambulatory Visit (HOSPITAL_COMMUNITY): Payer: Self-pay

## 2022-09-26 ENCOUNTER — Other Ambulatory Visit: Payer: Self-pay | Admitting: Oncology

## 2022-09-26 MED ORDER — OSIMERTINIB MESYLATE 80 MG PO TABS
80.0000 mg | ORAL_TABLET | Freq: Every day | ORAL | 3 refills | Status: DC
  Filled 2022-09-26: qty 30, 30d supply, fill #0
  Filled 2022-10-23: qty 30, 30d supply, fill #1
  Filled 2022-11-23: qty 30, 30d supply, fill #2
  Filled 2022-12-21: qty 30, 30d supply, fill #3

## 2022-09-27 ENCOUNTER — Other Ambulatory Visit (HOSPITAL_COMMUNITY): Payer: Self-pay

## 2022-10-23 ENCOUNTER — Other Ambulatory Visit (HOSPITAL_COMMUNITY): Payer: Self-pay

## 2022-10-26 ENCOUNTER — Other Ambulatory Visit (HOSPITAL_COMMUNITY): Payer: Self-pay

## 2022-11-02 ENCOUNTER — Telehealth: Payer: Self-pay

## 2022-11-02 LAB — HEPATIC FUNCTION PANEL
ALT: 13 U/L (ref 7–35)
AST: 22 (ref 13–35)
Alkaline Phosphatase: 70 (ref 25–125)
Bilirubin, Total: 0.5

## 2022-11-02 LAB — CBC: RBC: 4.59 (ref 3.87–5.11)

## 2022-11-02 LAB — BASIC METABOLIC PANEL
BUN: 7 (ref 4–21)
CO2: 24 — AB (ref 13–22)
Chloride: 107 (ref 99–108)
Creatinine: 0.6 (ref 0.5–1.1)
Glucose: 95
Potassium: 4.1 mEq/L (ref 3.5–5.1)
Sodium: 139 (ref 137–147)

## 2022-11-02 LAB — COMPREHENSIVE METABOLIC PANEL
Albumin: 4.3 (ref 3.5–5.0)
Calcium: 9.2 (ref 8.7–10.7)

## 2022-11-02 LAB — CBC AND DIFFERENTIAL
HCT: 43 (ref 36–46)
Hemoglobin: 14.9 (ref 12.0–16.0)
Neutrophils Absolute: 3.78
Platelets: 156 10*3/uL (ref 150–400)
WBC: 6

## 2022-11-02 NOTE — Telephone Encounter (Signed)
Pt is allergic to contrast and didn't take the 13hr prep for prophylaxis (as was not ordered). The contrast causes hives. Robet Leu wants to know if Dr Bobby Rumpf wants to do the CT without contrast OR R/S her? Pt is on the exam table @ present.  I spoke with Dr Bobby Rumpf, and he he agrees to pt having the CT without contrast. New order faxed to Radiology by Zettie Pho.

## 2022-11-05 ENCOUNTER — Ambulatory Visit: Payer: Medicaid Other | Admitting: Oncology

## 2022-11-12 NOTE — Progress Notes (Unsigned)
[facility]  [address] [place],  [place]  [address] [phone]  Clinic Day:  11/13/2022  Referring physician: Allean Found, MD  HISTORY OF PRESENT ILLNESS:  The patient is a 47 y.o. female with stage IVA (T4 N2 M1a) lung adenocarcinoma.  The stage IVA designation was based upon her initially having a malignant pleural effusion.  She had no evidence of distant metastasis.  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  The patient comes in today to review her most recent chest CT.  Overall, the patient claims to be doing fine.  She denies having any respiratory issues, such as shortness of breath or chest wall pain, which concern her for disease progression while on osimertinib.  Unfortunately, despite many discussions in the past about the importance of smoking abstinence, she continues to smoke on a daily basis.  However, she claims to be down to a few cigarettes daily.   PHYSICAL EXAM:  Blood pressure (!) 154/70, pulse 89, temperature 98.4 F (36.9 C), resp. rate 16, height '5\' 4"'$  (1.626 m), weight 138 lb 9.6 oz (62.9 kg), SpO2 99 %. Wt Readings from Last 3 Encounters:  11/13/22 138 lb 9.6 oz (62.9 kg)  07/03/22 139 lb 11.2 oz (63.4 kg)  05/15/22 140 lb (63.5 kg)   Body mass index is 23.79 kg/m. Performance status (ECOG): 1 - Symptomatic but completely ambulatory Physical Exam Constitutional:      Appearance: Normal appearance. She is not ill-appearing.  HENT:     Mouth/Throat:     Mouth: Mucous membranes are moist.     Pharynx: Oropharynx is clear. No oropharyngeal exudate or posterior oropharyngeal erythema.  Cardiovascular:     Rate and Rhythm: Normal rate and regular rhythm.     Heart sounds: No murmur heard.    No friction rub. No gallop.  Pulmonary:     Effort: Pulmonary effort is normal. No respiratory distress.     Breath sounds: Normal breath sounds. No wheezing, rhonchi or  rales.  Abdominal:     General: Bowel sounds are normal. There is no distension.     Palpations: Abdomen is soft. There is no mass.     Tenderness: There is no abdominal tenderness.  Musculoskeletal:        General: No swelling.     Right lower leg: No edema.     Left lower leg: No edema.  Lymphadenopathy:     Cervical: No cervical adenopathy.     Upper Body:     Right upper body: No supraclavicular or axillary adenopathy.     Left upper body: No supraclavicular or axillary adenopathy.     Lower Body: No right inguinal adenopathy. No left inguinal adenopathy.  Skin:    General: Skin is warm.     Coloration: Skin is not jaundiced.     Findings: No lesion or rash.  Neurological:     General: No focal deficit present.     Mental Status: She is alert and oriented to person, place, and time. Mental status is at baseline.  Psychiatric:        Mood and Affect: Mood normal.        Behavior: Behavior normal.        Thought Content: Thought content normal.    SCANS: Her recent CT chest revealed the following: FINDINGS: CT CHEST FINDINGS  Cardiovascular: Heart is nonenlarged. No pericardial effusion. On this non IV  contrast exam the thoracic aorta has a normal course and caliber.  Mediastinum/Nodes: No specific abnormal lymph node enlargement identified in the axillary regions or left hilum on this noncontrast examination. There are some calcified mediastinal and right hilar lymph nodes. Sequela of old infectious or inflammatory process. No discrete lymph node enlargement in these locations. Normal caliber thoracic esophagus.  Lungs/Pleura: Left lung has some centrilobular emphysematous changes. There is some linear opacity at the left lung base likely scar or atelectasis. Metallic focus or clip medially along the left lower lobe is stable. No left-sided consolidation, pneumothorax or effusion. No dominant nodule.  The right lung demonstrates areas of scarring atelectatic  changes and volume loss particularly in the medial right midlung, unchanged from previous. There is some distortion and bronchial wall thickening is stable. No right-sided pneumothorax, effusion or separate consolidation. Underlying centrilobular emphysematous changes. There is a calcified nodule right lower lobe on image 69 consistent with old granulomatous disease. Overall of the right lung is unchanged from prior.  Musculoskeletal: Mild degenerative changes along the spine.  CT ABDOMEN PELVIS FINDINGS  Hepatobiliary: On this non IV contrast exam, the liver is grossly preserved without space-occupying lesion. Gallbladder is nondilated.  Pancreas: Unremarkable. No pancreatic ductal dilatation or surrounding inflammatory changes.  Spleen: Normal in size without focal abnormality.  Adrenals/Urinary Tract: The adrenal glands are preserved. No abnormal calcifications seen in the right kidney. Punctate stone in the midportion of the left kidney. See coronal image 93 of series 601. The ureters have normal course and caliber extending down to the bladder. Preserved contours of the urinary bladder.  Stomach/Bowel: The large bowel has a normal course and caliber. Oral contrast was provided. Normal appendix extends inferiorly posterior to the cecum in the right hemipelvis. Stomach is mildly distended with debris and contrast. Overall the small bowel is nondilated. The appearance of subtle wall thickening along loops of jejunum is likely related to underdistention and opacification by contrast.  Vascular/Lymphatic: Normal caliber aorta and IVC with minimal atherosclerotic calcified plaque along the aorta and branch vessels. No specific abnormal lymph node enlargement present in the abdomen and pelvis.  Reproductive: Uterus and bilateral adnexa are unremarkable.  Other: Small fat containing umbilical hernia. Also second small fat containing hernia seen in the midline just above the  umbilicus.  Musculoskeletal: Mild degenerative changes of the spine and pelvis.  IMPRESSION: Stable posttreatment changes along the right right lung. No new mass lesion, fluid collection or lymph node enlargement.  Evidence of old granulomatous disease.  Emphysematous lung changes.  ASSESSMENT & PLAN:  Assessment/Plan:  A 47 y.o. 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   In clinic today, I reviewed her CT images with her, for which she could see that she essentially remains in a complete response since being on osimertinib since July 2022.  Clinically, she continues to do very well.  She knows to continue taking her osimertinib on a daily basis for her disease management.  I will see her back in 4 months for repeat clinical assessment.  Another chest CT will be done a day before her next visit for her continued radiographic lung cancer surveillance.  The patient understands all the plans discussed today and is in agreement with them.    Dazani Norby Macarthur Critchley, MD

## 2022-11-13 ENCOUNTER — Inpatient Hospital Stay: Payer: Medicaid Other | Attending: Oncology | Admitting: Oncology

## 2022-11-13 ENCOUNTER — Other Ambulatory Visit: Payer: Self-pay | Admitting: Oncology

## 2022-11-13 VITALS — BP 154/70 | HR 89 | Temp 98.4°F | Resp 16 | Ht 64.0 in | Wt 138.6 lb

## 2022-11-13 DIAGNOSIS — C3411 Malignant neoplasm of upper lobe, right bronchus or lung: Secondary | ICD-10-CM | POA: Diagnosis not present

## 2022-11-19 ENCOUNTER — Other Ambulatory Visit (HOSPITAL_COMMUNITY): Payer: Self-pay

## 2022-11-23 ENCOUNTER — Other Ambulatory Visit (HOSPITAL_COMMUNITY): Payer: Self-pay

## 2022-11-26 ENCOUNTER — Other Ambulatory Visit: Payer: Self-pay

## 2022-11-27 ENCOUNTER — Other Ambulatory Visit: Payer: Self-pay

## 2022-12-12 ENCOUNTER — Other Ambulatory Visit: Payer: Self-pay

## 2022-12-19 ENCOUNTER — Other Ambulatory Visit: Payer: Self-pay

## 2022-12-19 IMAGING — CT CT CHEST W/ CM
1 series · 14 of 34 positions shown, 18 images · IV contrast (APPLIED)
Comparison: CT chest, 02/08/2021, PET-CT, 12/12/2020

CLINICAL DATA: Metastatic lung cancer surveillance

EXAM:
CT CHEST WITH CONTRAST
TECHNIQUE: Multidetector CT imaging of the chest was performed during
intravenous contrast administration.
CONTRAST:  75mL 798PM4-GYY IOPAMIDOL (798PM4-GYY) INJECTION 61%

[Series 2: chest w/cm · axial · 0.67mm/px · z∈[-284,-6]mm · 14 of 165 slices shown, 18 images]
[im 13/165  mediastinal]
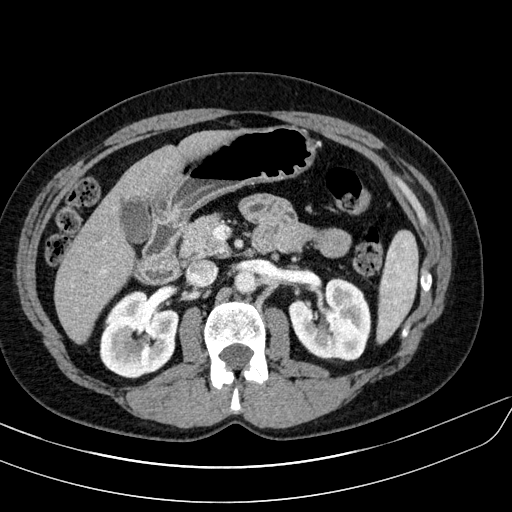
[im 13/165  lung]
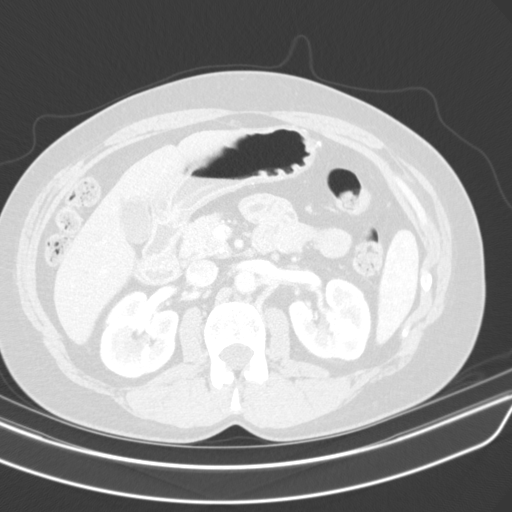
[im 25/165  lung]
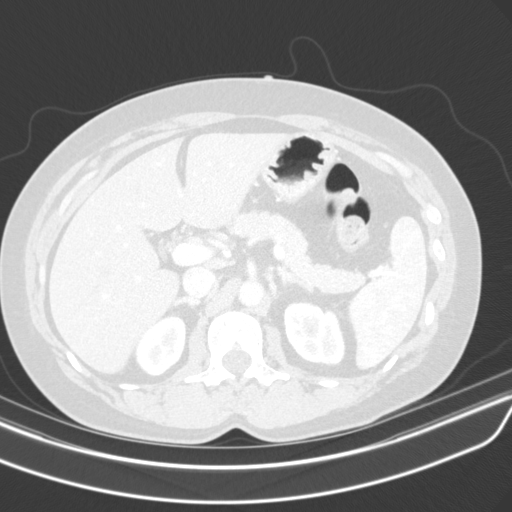
[im 33/165  lung]
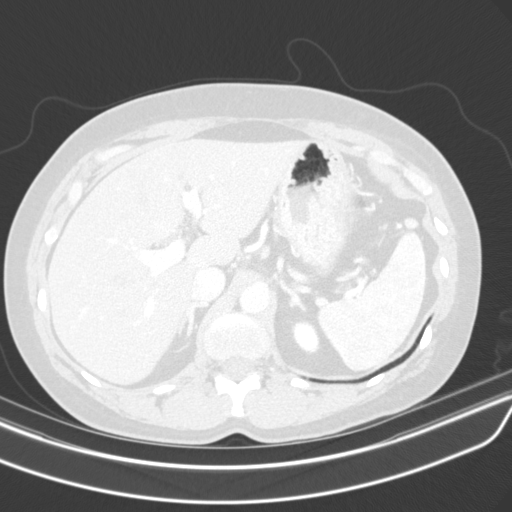
[im 49/165  lung]
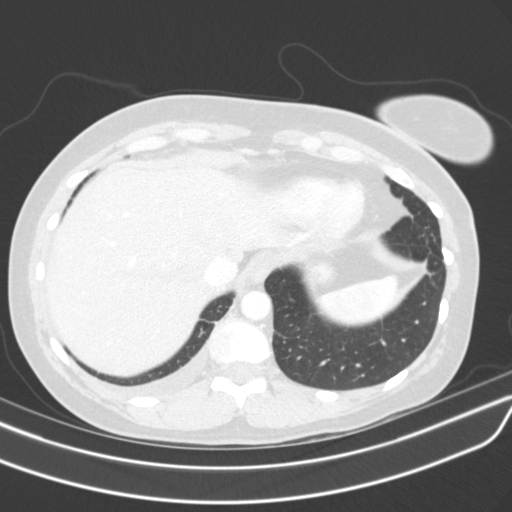
[im 61/165  mediastinal]
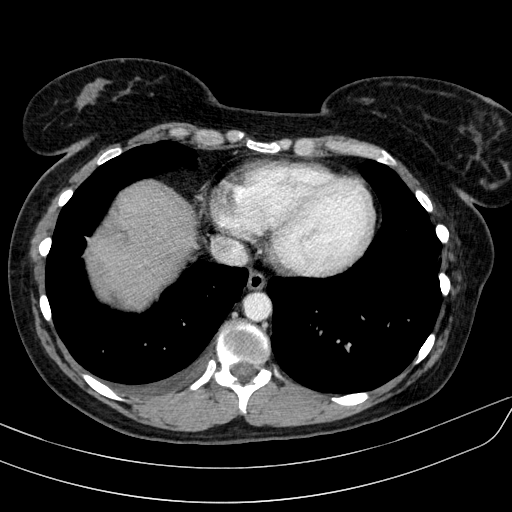
[im 61/165  lung]
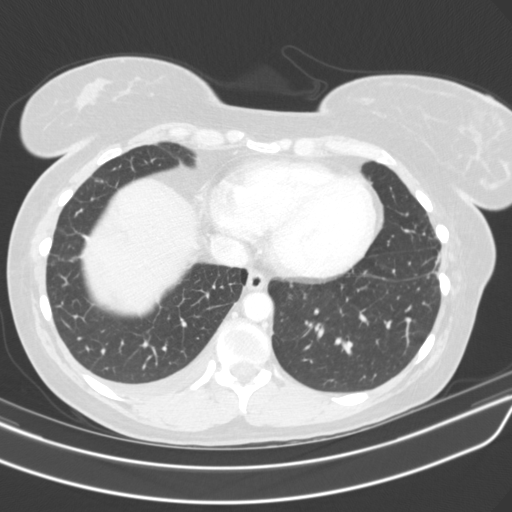
[im 67/165  lung]
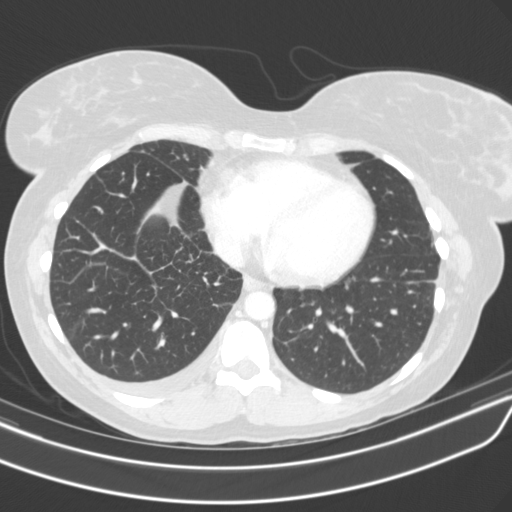
[im 78/165  lung]
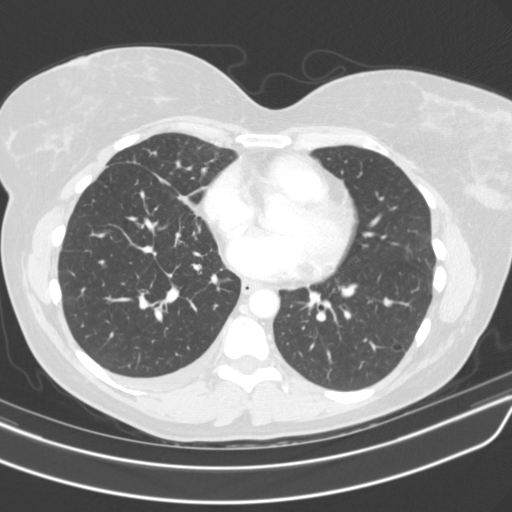
[im 87/165  lung]
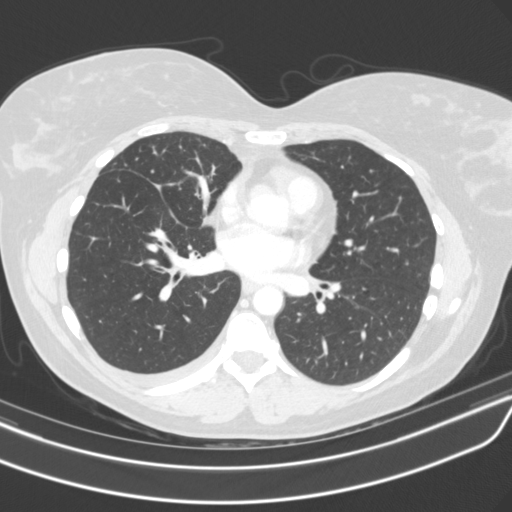
[im 98/165  mediastinal]
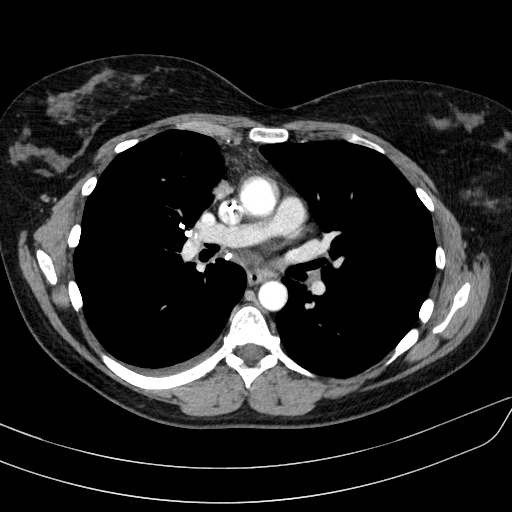
[im 98/165  lung]
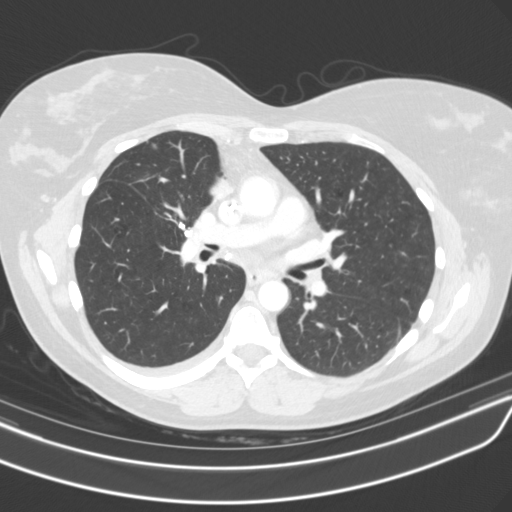
[im 104/165  lung]
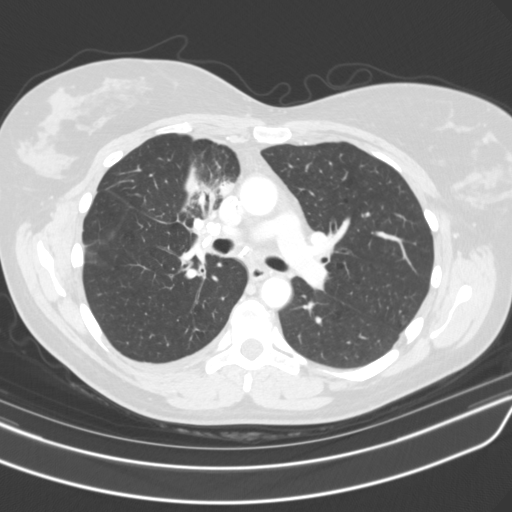
[im 122/165  lung]
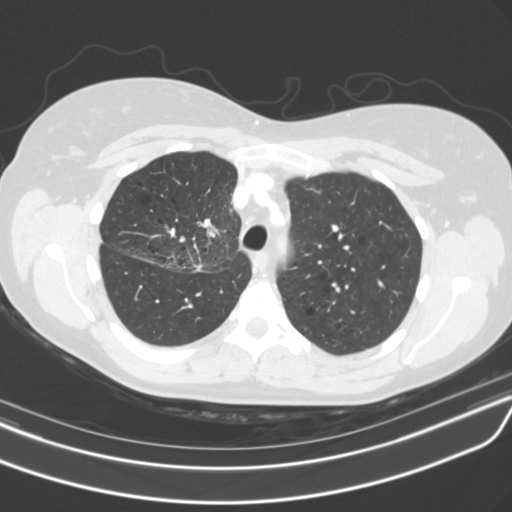
[im 132/165  lung]
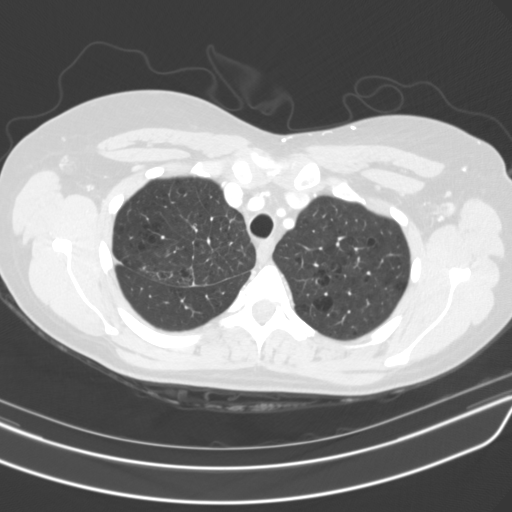
[im 140/165  mediastinal]
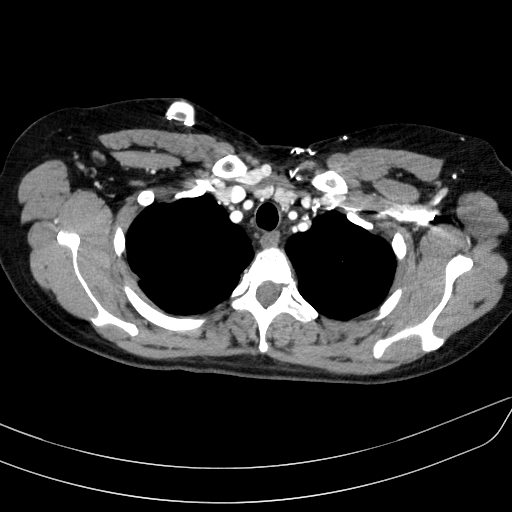
[im 140/165  lung]
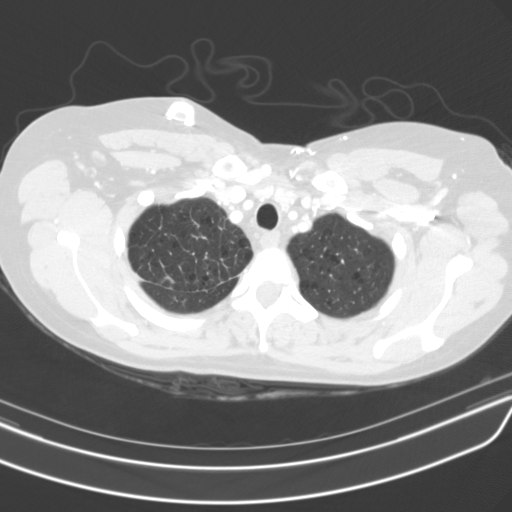
[im 152/165  lung]
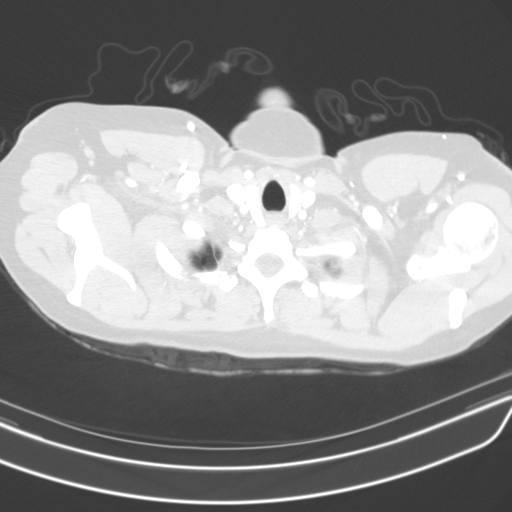

[14 of 34 positions shown; findings below may reference images not displayed]

FINDINGS: Cardiovascular: Right chest port catheter. Normal heart size. No
pericardial effusion.

Mediastinum/Nodes: Interval decrease in size of a pretracheal lymph
node, measuring 0.8 x 0.7 cm, previously 1.3 x 1.0 cm (series 2,
image 54). Calcified mediastinal and right hilar lymph nodes.
Thyroid gland, trachea, and esophagus demonstrate no significant
findings.

Lungs/Pleura: Small right pleural effusion, diminished compared to
prior examination. Moderate centrilobular emphysema. Significant
interval reduction in bulk of mass and or consolidation of the right
upper lobe seen on prior examination, with dramatically improved
aeration of the right upper lobe. Residual consolidation or scarring
of the inferior medial right upper lobe (series 3, image 60).
Additional resolution of consolidation of the anterior right lower
lobe. Persistent, although improved interlobular septal thickening
about the right lung base (series 3, image 97).

Upper Abdomen: No acute abnormality.

Musculoskeletal: No chest wall mass. Probable sclerotic osseous
metastasis of the inferior endplate of L2 (series 6, image 75).
IMPRESSION: 1. Significant interval reduction in bulk of mass and or
consolidation of the right upper lobe seen on prior examination,
with dramatically improved aeration of the right upper lobe.
Residual consolidation or scarring of the inferior medial right
upper lobe. Additional resolution of consolidation of the anterior
right lower lobe.
2. Interval decrease in size of a pretracheal lymph node.
3. Persistent, although improved interlobular septal thickening
about the right lung base.
4. Small right pleural effusion, diminished compared to prior
examination.
5. Constellation of findings is consistent with treatment response
primary lung malignancy as well as nodal, pleural, and lymphangitic
metastatic disease.
6. Probable sclerotic osseous metastasis of the inferior endplate of
L2, new compared to prior examination and consistent with post
treatment sclerosis.
7. Emphysema.

Emphysema ([id number]).

## 2022-12-21 ENCOUNTER — Other Ambulatory Visit: Payer: Self-pay

## 2022-12-26 ENCOUNTER — Other Ambulatory Visit (HOSPITAL_COMMUNITY): Payer: Self-pay

## 2023-01-15 ENCOUNTER — Other Ambulatory Visit (HOSPITAL_COMMUNITY): Payer: Self-pay

## 2023-01-17 ENCOUNTER — Other Ambulatory Visit (HOSPITAL_COMMUNITY): Payer: Self-pay

## 2023-01-17 ENCOUNTER — Other Ambulatory Visit: Payer: Self-pay | Admitting: Oncology

## 2023-01-21 ENCOUNTER — Other Ambulatory Visit (HOSPITAL_COMMUNITY): Payer: Self-pay

## 2023-01-22 ENCOUNTER — Other Ambulatory Visit: Payer: Self-pay | Admitting: Hematology and Oncology

## 2023-01-22 ENCOUNTER — Other Ambulatory Visit (HOSPITAL_COMMUNITY): Payer: Self-pay

## 2023-01-22 ENCOUNTER — Other Ambulatory Visit: Payer: Self-pay

## 2023-01-22 MED ORDER — OSIMERTINIB MESYLATE 80 MG PO TABS
80.0000 mg | ORAL_TABLET | Freq: Every day | ORAL | 3 refills | Status: DC
  Filled 2023-01-22: qty 30, 30d supply, fill #0
  Filled 2023-02-22: qty 30, 30d supply, fill #1
  Filled 2023-03-20: qty 30, 30d supply, fill #2
  Filled 2023-04-26: qty 30, 30d supply, fill #3

## 2023-01-23 ENCOUNTER — Other Ambulatory Visit (HOSPITAL_COMMUNITY): Payer: Self-pay

## 2023-02-11 NOTE — Progress Notes (Signed)
[facility]  [address] [place],  [place]  [address] [phone]  Clinic Day:  02/12/2023  Referring physician: Donata Duff, MD  HISTORY OF PRESENT ILLNESS:  The patient is a 47 y.o. female with stage IVA (T4 N2 M1a) lung adenocarcinoma.  The stage IVA designation was based upon her initially having a malignant pleural effusion.  She had no evidence of distant metastasis.  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  All scans to date have shown no evidence of disease recurrence.  The patient comes in today as she was recently in the emergency room for what appeared to have been a transient ischemic attack.  A head CT done at that time did not reveal any CNS disease present.  Furthermore, the patient underwent a brain MRI at [facility] yesterday, whose results came back negative for CNS involvement.  The patient claims her memory has not been particularly sharp.  She now relies upon others to drive her.  At the time that she had a TIA, she claims to have had right-sided facial drooping.  As it pertains to her lung cancer diagnosis, she denies having any new respiratory symptoms which concern her for disease recurrence.  Unfortunately, she continues to smoke a few cigarettes daily.   PHYSICAL EXAM:  Blood pressure (!) 143/96, pulse 78, temperature 97.6 F (36.4 C), resp. rate 16, height 5\' 4"  (1.626 m), weight 139 lb 11.2 oz (63.4 kg), SpO2 97 %. Wt Readings from Last 3 Encounters:  02/12/23 139 lb 11.2 oz (63.4 kg)  11/13/22 138 lb 9.6 oz (62.9 kg)  07/03/22 139 lb 11.2 oz (63.4 kg)   Body mass index is 23.98 kg/m. Performance status (ECOG): 1 - Symptomatic but completely ambulatory Physical Exam Constitutional:      Appearance: Normal appearance. She is not ill-appearing.  HENT:     Mouth/Throat:     Mouth: Mucous membranes are moist.     Pharynx: Oropharynx is clear. No  oropharyngeal exudate or posterior oropharyngeal erythema.  Cardiovascular:     Rate and Rhythm: Normal rate and regular rhythm.     Heart sounds: No murmur heard.    No friction rub. No gallop.  Pulmonary:     Effort: Pulmonary effort is normal. No respiratory distress.     Breath sounds: Normal breath sounds. No wheezing, rhonchi or rales.  Abdominal:     General: Bowel sounds are normal. There is no distension.     Palpations: Abdomen is soft. There is no mass.     Tenderness: There is no abdominal tenderness.  Musculoskeletal:        General: No swelling.     Right lower leg: No edema.     Left lower leg: No edema.  Lymphadenopathy:     Cervical: No cervical adenopathy.     Upper Body:     Right upper body: No supraclavicular or axillary adenopathy.     Left upper body: No supraclavicular or axillary adenopathy.     Lower Body: No right inguinal adenopathy. No left inguinal adenopathy.  Skin:    General: Skin is warm.     Coloration: Skin is not jaundiced.     Findings: No lesion or rash.  Neurological:     General: No focal deficit present.     Mental Status: She is alert and oriented to person, place, and time. Mental status is at baseline.  Psychiatric:  Mood and Affect: Mood normal.        Behavior: Behavior normal.        Thought Content: Thought content normal.    SCANS: Her brain MRI done yesterday at [facility] revealed the following: FINDINGS:  Calvarium/skull base: Within normal limits.  Paranasal sinuses: Within normal limits.  Brain: There is grossly normal flow-related signal within the major intracranial arterial vasculature. No midline shift. No acute hydrocephalus. No evidence of acute ischemia. Minimal scattered T2/FLAIR signal. No suspicious T2/FLAIR signal. No evidence of acute intracranial hemorrhage. No evidence of abnormal intracranial enhancement. No evidence of mass lesion.   Additional comments: None.   IMPRESSION:  1. No evidence of  intracranial metastatic disease.  2. No acute findings.   ASSESSMENT & PLAN:  Assessment/Plan:  A 47 y.o. 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   In clinic today, I reassured her that her neurological symptoms are not due to her osimertinib oral therapy.  There was nothing per her physical exam today to suggest any neurological deficits were present.  Furthermore, I reviewed her brain MRI done yesterday with her, which came back unremarkable for any CNS metastatic disease.  If this patient was to develop any more neurological symptoms over these next few weeks, I would recommend that she undergo a lumbar puncture to ensure no malignant cells are floating throughout her CNS, which could be causing some of her transient neurological changes.  Otherwise, the patient knows to continue taking osimertinib on a daily basis.  She is already scheduled to see me next month, with a chest CT being done a day before her next visit for her continued lung cancer surveillance.  The patient understands all the plans discussed today and is in agreement with them.    Anothy Bufano Kirby Funk, MD

## 2023-02-12 ENCOUNTER — Inpatient Hospital Stay: Payer: Medicaid Other | Attending: Oncology | Admitting: Oncology

## 2023-02-12 VITALS — BP 143/96 | HR 78 | Temp 97.6°F | Resp 16 | Ht 64.0 in | Wt 139.7 lb

## 2023-02-12 DIAGNOSIS — C3411 Malignant neoplasm of upper lobe, right bronchus or lung: Secondary | ICD-10-CM

## 2023-02-19 ENCOUNTER — Other Ambulatory Visit (HOSPITAL_COMMUNITY): Payer: Self-pay

## 2023-02-20 ENCOUNTER — Other Ambulatory Visit: Payer: Self-pay | Admitting: Oncology

## 2023-02-20 DIAGNOSIS — C3411 Malignant neoplasm of upper lobe, right bronchus or lung: Secondary | ICD-10-CM

## 2023-02-21 ENCOUNTER — Telehealth: Payer: Self-pay | Admitting: Oncology

## 2023-02-21 NOTE — Telephone Encounter (Signed)
CT C/A/P has been scheduled for 03/14/23 @ 11; Checking in @ 10 am   Notified pt of date,time and instructions.

## 2023-02-22 ENCOUNTER — Other Ambulatory Visit (HOSPITAL_COMMUNITY): Payer: Self-pay

## 2023-03-15 ENCOUNTER — Inpatient Hospital Stay: Payer: Medicaid Other | Admitting: Oncology

## 2023-03-15 NOTE — Progress Notes (Deleted)
[facility]  [address] [place],  [place]  [address] [phone]  Clinic Day:  03/15/2023  Referring physician: Donata Duff, MD  HISTORY OF PRESENT ILLNESS:  The patient is a 47 y.o. female with stage IVA (T4 N2 M1a) lung adenocarcinoma.  The stage IVA designation was based upon her initially having a malignant pleural effusion.  She had no evidence of distant metastasis.  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  All scans to date have shown no evidence of disease recurrence.  The patient comes in today as she was recently in the emergency room for what appeared to have been a transient ischemic attack.  A head CT done at that time did not reveal any CNS disease present.  Furthermore, the patient underwent a brain MRI at [facility] yesterday, whose results came back negative for CNS involvement.  The patient claims her memory has not been particularly sharp.  She now relies upon others to drive her.  At the time that she had a TIA, she claims to have had right-sided facial drooping.  As it pertains to her lung cancer diagnosis, she denies having any new respiratory symptoms which concern her for disease recurrence.  Unfortunately, she continues to smoke a few cigarettes daily.   PHYSICAL EXAM:  There were no vitals taken for this visit. Wt Readings from Last 3 Encounters:  02/12/23 139 lb 11.2 oz (63.4 kg)  11/13/22 138 lb 9.6 oz (62.9 kg)  07/03/22 139 lb 11.2 oz (63.4 kg)   There is no height or weight on file to calculate BMI. Performance status (ECOG): 1 - Symptomatic but completely ambulatory Physical Exam Constitutional:      Appearance: Normal appearance. She is not ill-appearing.  HENT:     Mouth/Throat:     Mouth: Mucous membranes are moist.     Pharynx: Oropharynx is clear. No oropharyngeal exudate or posterior oropharyngeal erythema.  Cardiovascular:     Rate and  Rhythm: Normal rate and regular rhythm.     Heart sounds: No murmur heard.    No friction rub. No gallop.  Pulmonary:     Effort: Pulmonary effort is normal. No respiratory distress.     Breath sounds: Normal breath sounds. No wheezing, rhonchi or rales.  Abdominal:     General: Bowel sounds are normal. There is no distension.     Palpations: Abdomen is soft. There is no mass.     Tenderness: There is no abdominal tenderness.  Musculoskeletal:        General: No swelling.     Right lower leg: No edema.     Left lower leg: No edema.  Lymphadenopathy:     Cervical: No cervical adenopathy.     Upper Body:     Right upper body: No supraclavicular or axillary adenopathy.     Left upper body: No supraclavicular or axillary adenopathy.     Lower Body: No right inguinal adenopathy. No left inguinal adenopathy.  Skin:    General: Skin is warm.     Coloration: Skin is not jaundiced.     Findings: No lesion or rash.  Neurological:     General: No focal deficit present.     Mental Status: She is alert and oriented to person, place, and time. Mental status is at baseline.  Psychiatric:        Mood and Affect: Mood normal.  Behavior: Behavior normal.        Thought Content: Thought content normal.   SCANS: Her brain MRI done yesterday at [facility] revealed the following: FINDINGS:  Calvarium/skull base: Within normal limits.  Paranasal sinuses: Within normal limits.  Brain: There is grossly normal flow-related signal within the major intracranial arterial vasculature. No midline shift. No acute hydrocephalus. No evidence of acute ischemia. Minimal scattered T2/FLAIR signal. No suspicious T2/FLAIR signal. No evidence of acute intracranial hemorrhage. No evidence of abnormal intracranial enhancement. No evidence of mass lesion.   Additional comments: None.   IMPRESSION:  1. No evidence of intracranial metastatic disease.  2. No acute findings.   ASSESSMENT & PLAN:   Assessment/Plan:  A 47 y.o. 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   In clinic today, I reassured her that her neurological symptoms are not due to her osimertinib oral therapy.  There was nothing per her physical exam today to suggest any neurological deficits were present.  Furthermore, I reviewed her brain MRI done yesterday with her, which came back unremarkable for any CNS metastatic disease.  If this patient was to develop any more neurological symptoms over these next few weeks, I would recommend that she undergo a lumbar puncture to ensure no malignant cells are floating throughout her CNS, which could be causing some of her transient neurological changes.  Otherwise, the patient knows to continue taking osimertinib on a daily basis.  She is already scheduled to see me next month, with a chest CT being done a day before her next visit for her continued lung cancer surveillance.  The patient understands all the plans discussed today and is in agreement with them.     Kirby Funk, MD

## 2023-03-20 ENCOUNTER — Other Ambulatory Visit (HOSPITAL_COMMUNITY): Payer: Self-pay

## 2023-03-20 LAB — COMPREHENSIVE METABOLIC PANEL
Albumin: 4.5 (ref 3.5–5.0)
Calcium: 9.2 (ref 8.7–10.7)

## 2023-03-20 LAB — CBC AND DIFFERENTIAL
HCT: 40 (ref 36–46)
Hemoglobin: 14 (ref 12.0–16.0)
Neutrophils Absolute: 3.02
Platelets: 145 10*3/uL — AB (ref 150–400)
WBC: 5.7

## 2023-03-20 LAB — HEPATIC FUNCTION PANEL
ALT: 13 U/L (ref 7–35)
AST: 27 (ref 13–35)
Alkaline Phosphatase: 66 (ref 25–125)
Bilirubin, Total: 0.9

## 2023-03-20 LAB — BASIC METABOLIC PANEL
BUN: 9 (ref 4–21)
CO2: 23 — AB (ref 13–22)
Chloride: 103 (ref 99–108)
Creatinine: 0.8 (ref 0.5–1.1)
Glucose: 83
Potassium: 3.7 mEq/L (ref 3.5–5.1)
Sodium: 137 (ref 137–147)

## 2023-03-20 LAB — CBC: RBC: 4.31 (ref 3.87–5.11)

## 2023-03-21 ENCOUNTER — Other Ambulatory Visit: Payer: Self-pay

## 2023-03-21 NOTE — Progress Notes (Deleted)
[facility]  [address] [place],  [place]  [address] [phone]  Clinic Day:  03/21/2023  Referring physician: Donata Duff, MD  HISTORY OF PRESENT ILLNESS:  The patient is a 47 y.o. female with stage IVA (T4 N2 M1a) lung adenocarcinoma.  The stage IVA designation was based upon her initially having a malignant pleural effusion.  She had no evidence of distant metastasis.  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  All scans to date have shown no evidence of disease recurrence.  The patient comes in today as she was recently in the emergency room for what appeared to have been a transient ischemic attack.  A head CT done at that time did not reveal any CNS disease present.  Furthermore, the patient underwent a brain MRI at [facility] yesterday, whose results came back negative for CNS involvement.  The patient claims her memory has not been particularly sharp.  She now relies upon others to drive her.  At the time that she had a TIA, she claims to have had right-sided facial drooping.  As it pertains to her lung cancer diagnosis, she denies having any new respiratory symptoms which concern her for disease recurrence.  Unfortunately, she continues to smoke a few cigarettes daily.   PHYSICAL EXAM:  There were no vitals taken for this visit. Wt Readings from Last 3 Encounters:  02/12/23 139 lb 11.2 oz (63.4 kg)  11/13/22 138 lb 9.6 oz (62.9 kg)  07/03/22 139 lb 11.2 oz (63.4 kg)   There is no height or weight on file to calculate BMI. Performance status (ECOG): 1 - Symptomatic but completely ambulatory Physical Exam Constitutional:      Appearance: Normal appearance. She is not ill-appearing.  HENT:     Mouth/Throat:     Mouth: Mucous membranes are moist.     Pharynx: Oropharynx is clear. No oropharyngeal exudate or posterior oropharyngeal erythema.  Cardiovascular:     Rate and  Rhythm: Normal rate and regular rhythm.     Heart sounds: No murmur heard.    No friction rub. No gallop.  Pulmonary:     Effort: Pulmonary effort is normal. No respiratory distress.     Breath sounds: Normal breath sounds. No wheezing, rhonchi or rales.  Abdominal:     General: Bowel sounds are normal. There is no distension.     Palpations: Abdomen is soft. There is no mass.     Tenderness: There is no abdominal tenderness.  Musculoskeletal:        General: No swelling.     Right lower leg: No edema.     Left lower leg: No edema.  Lymphadenopathy:     Cervical: No cervical adenopathy.     Upper Body:     Right upper body: No supraclavicular or axillary adenopathy.     Left upper body: No supraclavicular or axillary adenopathy.     Lower Body: No right inguinal adenopathy. No left inguinal adenopathy.  Skin:    General: Skin is warm.     Coloration: Skin is not jaundiced.     Findings: No lesion or rash.  Neurological:     General: No focal deficit present.     Mental Status: She is alert and oriented to person, place, and time. Mental status is at baseline.  Psychiatric:        Mood and Affect: Mood normal.  Behavior: Behavior normal.        Thought Content: Thought content normal.    SCANS: CT scans of her chest/abdomen/pelvis include the following: FINDINGS: CT CHEST FINDINGS  Cardiovascular: Normal caliber thoracic aorta. Normal caliber central pulmonary arteries. Normal size heart. No significant pericardial effusion/thickening  Mediastinum/Nodes: No suspicious thyroid nodule. Calcified mediastinal and hilar lymph nodes. No pathologically enlarged mediastinal, hilar or axillary lymph nodes. The esophagus is grossly unremarkable.  Lungs/Pleura: Nodular/banded opacity in the right upper lobe extending from the hilum with architectural distortion, volume loss and bronchiectasis/bronchiectasis, a dominant nodular area measures 16 x 13 mm on image 46/301  previously 14 x 13 mm, although the entire opacity appears similar to multiple prior studies when evaluated on coronal imaging, and the increase in size is favored secondary to orthogonal plane/slice selection.  New 4 mm right upper lobe pulmonary nodule on image 301.  Centrilobular and paraseptal emphysema. Scattered linear bands of atelectasis/scarring. No pleural effusion. No pneumothorax.  Musculoskeletal: Similar appearance of the sclerotic focus in the inferior left lateral T6 vertebral body at the costovertebral joint unchanged dating back to at least 2022 CTA chest and not hypermetabolic on prior PET examination. No new aggressive lytic or blastic lesion of bone.  CT ABDOMEN PELVIS FINDINGS  Hepatobiliary: Unremarkable noncontrast enhanced appearance of the hepatic parenchyma. Gallbladder is unremarkable. No biliary ductal dilation.  Pancreas: No pancreatic ductal dilation or evidence of acute inflammation.  Spleen: No splenomegaly.  Adrenals/Urinary Tract: Bilateral adrenal glands are unchanged. No hydronephrosis. No renal, ureteral or bladder calculi. Urinary bladder is unremarkable for degree of distension.  Stomach/Bowel: Radiopaque enteric contrast material traverses the rectum. Stomach is unremarkable for degree of distension. No pathologic dilation of small or large bowel. No evidence of acute bowel inflammation  Vascular/Lymphatic: Scattered aortic atherosclerosis. Normal caliber abdominal aorta. Smooth IVC contours. No pathologically enlarged abdominal or pelvic lymph nodes.  Reproductive: Uterus and bilateral adnexa are unremarkable.  Other: No significant abdominopelvic free fluid.  Musculoskeletal: Sclerotic focus in the posterior aspect of the T2 vertebral body on image 96/602 present dating back to PET-CT December 12, 2020 and not hypermetabolic on that examination. No new suspicious lytic or blastic lesions of bone.  IMPRESSION: 1. New 4 mm right  upper lobe pulmonary nodule, suggest attention on short-term interval follow-up dedicated chest CT. 2. A dominant nodular focus within the posttreatment change in the right upper lobe measures increased in size on axial imaging although when the entire opacity is evaluated in coronal plane it appears similar to multiple prior examinations, and as such the increase in size is favored secondary to measurement in orthogonal plane/slice selection. Suggest attention on short-term interval follow-up imaging. 3. No noncontrast CT evidence of metastatic disease in the abdomen or pelvis. 4. Similar appearance of the sclerotic foci in the T2 and T6 vertebral bodies dating back to at least 2022 CTA chest and not hypermetabolic on prior PET examination. 5. Aortic Atherosclerosis (ICD10-I70.0) and Emphysema (ICD10-J43.9). FINDINGS:  Calvarium/skull base: Within normal limits.  Paranasal sinuses: Within normal limits.  Brain: There is grossly normal flow-related signal within the major intracranial arterial vasculature. No midline shift. No acute hydrocephalus. No evidence of acute ischemia. Minimal scattered T2/FLAIR signal. No suspicious T2/FLAIR signal. No evidence of acute intracranial hemorrhage. No evidence of abnormal intracranial enhancement. No evidence of mass lesion.   Additional comments: None.   IMPRESSION:  1. No evidence of intracranial metastatic disease.  2. No acute findings.   ASSESSMENT & PLAN:  Assessment/Plan:  A 46 y.o. 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   In clinic today, I reassured her that her neurological symptoms are not due to her osimertinib oral therapy.  There was nothing per her physical exam today to suggest any neurological deficits were present.  Furthermore, I reviewed her brain MRI done yesterday with her, which came back unremarkable for any CNS metastatic disease.  If this patient was to develop any more neurological  symptoms over these next few weeks, I would recommend that she undergo a lumbar puncture to ensure no malignant cells are floating throughout her CNS, which could be causing some of her transient neurological changes.  Otherwise, the patient knows to continue taking osimertinib on a daily basis.  She is already scheduled to see me next month, with a chest CT being done a day before her next visit for her continued lung cancer surveillance.  The patient understands all the plans discussed today and is in agreement with them.     Kirby Funk, MD

## 2023-03-22 ENCOUNTER — Inpatient Hospital Stay: Payer: Medicaid Other | Admitting: Oncology

## 2023-03-24 NOTE — Progress Notes (Deleted)
[facility]  [address] [place],  [place]  [address] [phone]  Clinic Day:  03/24/2023  Referring physician: Donata Duff, MD  HISTORY OF PRESENT ILLNESS:  The patient is a 47 y.o. female with stage IVA (T4 N2 M1a) lung adenocarcinoma.  The stage IVA designation was based upon her initially having a malignant pleural effusion.  She had no evidence of distant metastasis.  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  All scans to date have shown no evidence of disease recurrence.  The patient comes in today as she was recently in the emergency room for what appeared to have been a transient ischemic attack.  A head CT done at that time did not reveal any CNS disease present.  Furthermore, the patient underwent a brain MRI at [facility] yesterday, whose results came back negative for CNS involvement.  The patient claims her memory has not been particularly sharp.  She now relies upon others to drive her.  At the time that she had a TIA, she claims to have had right-sided facial drooping.  As it pertains to her lung cancer diagnosis, she denies having any new respiratory symptoms which concern her for disease recurrence.  Unfortunately, she continues to smoke a few cigarettes daily.   PHYSICAL EXAM:  There were no vitals taken for this visit. Wt Readings from Last 3 Encounters:  02/12/23 139 lb 11.2 oz (63.4 kg)  11/13/22 138 lb 9.6 oz (62.9 kg)  07/03/22 139 lb 11.2 oz (63.4 kg)   There is no height or weight on file to calculate BMI. Performance status (ECOG): 1 - Symptomatic but completely ambulatory Physical Exam Constitutional:      Appearance: Normal appearance. She is not ill-appearing.  HENT:     Mouth/Throat:     Mouth: Mucous membranes are moist.     Pharynx: Oropharynx is clear. No oropharyngeal exudate or posterior oropharyngeal erythema.  Cardiovascular:     Rate and  Rhythm: Normal rate and regular rhythm.     Heart sounds: No murmur heard.    No friction rub. No gallop.  Pulmonary:     Effort: Pulmonary effort is normal. No respiratory distress.     Breath sounds: Normal breath sounds. No wheezing, rhonchi or rales.  Abdominal:     General: Bowel sounds are normal. There is no distension.     Palpations: Abdomen is soft. There is no mass.     Tenderness: There is no abdominal tenderness.  Musculoskeletal:        General: No swelling.     Right lower leg: No edema.     Left lower leg: No edema.  Lymphadenopathy:     Cervical: No cervical adenopathy.     Upper Body:     Right upper body: No supraclavicular or axillary adenopathy.     Left upper body: No supraclavicular or axillary adenopathy.     Lower Body: No right inguinal adenopathy. No left inguinal adenopathy.  Skin:    General: Skin is warm.     Coloration: Skin is not jaundiced.     Findings: No lesion or rash.  Neurological:     General: No focal deficit present.     Mental Status: She is alert and oriented to person, place, and time. Mental status is at baseline.  Psychiatric:        Mood and Affect: Mood normal.  Behavior: Behavior normal.        Thought Content: Thought content normal.    SCANS: CT scans of her chest/abdomen/pelvis include the following: FINDINGS: CT CHEST FINDINGS  Cardiovascular: Normal caliber thoracic aorta. Normal caliber central pulmonary arteries. Normal size heart. No significant pericardial effusion/thickening  Mediastinum/Nodes: No suspicious thyroid nodule. Calcified mediastinal and hilar lymph nodes. No pathologically enlarged mediastinal, hilar or axillary lymph nodes. The esophagus is grossly unremarkable.  Lungs/Pleura: Nodular/banded opacity in the right upper lobe extending from the hilum with architectural distortion, volume loss and bronchiectasis/bronchiectasis, a dominant nodular area measures 16 x 13 mm on image 46/301  previously 14 x 13 mm, although the entire opacity appears similar to multiple prior studies when evaluated on coronal imaging, and the increase in size is favored secondary to orthogonal plane/slice selection.  New 4 mm right upper lobe pulmonary nodule on image 301.  Centrilobular and paraseptal emphysema. Scattered linear bands of atelectasis/scarring. No pleural effusion. No pneumothorax.  Musculoskeletal: Similar appearance of the sclerotic focus in the inferior left lateral T6 vertebral body at the costovertebral joint unchanged dating back to at least 2022 CTA chest and not hypermetabolic on prior PET examination. No new aggressive lytic or blastic lesion of bone.  CT ABDOMEN PELVIS FINDINGS  Hepatobiliary: Unremarkable noncontrast enhanced appearance of the hepatic parenchyma. Gallbladder is unremarkable. No biliary ductal dilation.  Pancreas: No pancreatic ductal dilation or evidence of acute inflammation.  Spleen: No splenomegaly.  Adrenals/Urinary Tract: Bilateral adrenal glands are unchanged. No hydronephrosis. No renal, ureteral or bladder calculi. Urinary bladder is unremarkable for degree of distension.  Stomach/Bowel: Radiopaque enteric contrast material traverses the rectum. Stomach is unremarkable for degree of distension. No pathologic dilation of small or large bowel. No evidence of acute bowel inflammation  Vascular/Lymphatic: Scattered aortic atherosclerosis. Normal caliber abdominal aorta. Smooth IVC contours. No pathologically enlarged abdominal or pelvic lymph nodes.  Reproductive: Uterus and bilateral adnexa are unremarkable.  Other: No significant abdominopelvic free fluid.  Musculoskeletal: Sclerotic focus in the posterior aspect of the T2 vertebral body on image 96/602 present dating back to PET-CT December 12, 2020 and not hypermetabolic on that examination. No new suspicious lytic or blastic lesions of bone.  IMPRESSION: 1. New 4 mm right  upper lobe pulmonary nodule, suggest attention on short-term interval follow-up dedicated chest CT. 2. A dominant nodular focus within the posttreatment change in the right upper lobe measures increased in size on axial imaging although when the entire opacity is evaluated in coronal plane it appears similar to multiple prior examinations, and as such the increase in size is favored secondary to measurement in orthogonal plane/slice selection. Suggest attention on short-term interval follow-up imaging. 3. No noncontrast CT evidence of metastatic disease in the abdomen or pelvis. 4. Similar appearance of the sclerotic foci in the T2 and T6 vertebral bodies dating back to at least 2022 CTA chest and not hypermetabolic on prior PET examination. 5. Aortic Atherosclerosis (ICD10-I70.0) and Emphysema (ICD10-J43.9). ----------------------------------------------------------------------------------------------------------- FINDINGS:  Calvarium/skull base: Within normal limits.  Paranasal sinuses: Within normal limits.  Brain: There is grossly normal flow-related signal within the major intracranial arterial vasculature. No midline shift. No acute hydrocephalus. No evidence of acute ischemia. Minimal scattered T2/FLAIR signal. No suspicious T2/FLAIR signal. No evidence of acute intracranial hemorrhage. No evidence of abnormal intracranial enhancement. No evidence of mass lesion.   Additional comments: None.   IMPRESSION:  1. No evidence of intracranial metastatic disease.  2. No acute findings.   ASSESSMENT & PLAN:  Assessment/Plan:  A 47 y.o. 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   In clinic today, I reassured her that her neurological symptoms are not due to her osimertinib oral therapy.  There was nothing per her physical exam today to suggest any neurological deficits were present.  Furthermore, I reviewed her brain MRI done yesterday with her, which  came back unremarkable for any CNS metastatic disease.  If this patient was to develop any more neurological symptoms over these next few weeks, I would recommend that she undergo a lumbar puncture to ensure no malignant cells are floating throughout her CNS, which could be causing some of her transient neurological changes.  Otherwise, the patient knows to continue taking osimertinib on a daily basis.  She is already scheduled to see me next month, with a chest CT being done a day before her next visit for her continued lung cancer surveillance.  The patient understands all the plans discussed today and is in agreement with them.     Kirby Funk, MD

## 2023-03-25 ENCOUNTER — Inpatient Hospital Stay: Payer: Medicaid Other | Attending: Oncology | Admitting: Oncology

## 2023-03-25 ENCOUNTER — Other Ambulatory Visit (HOSPITAL_COMMUNITY): Payer: Self-pay

## 2023-03-25 ENCOUNTER — Telehealth: Payer: Self-pay | Admitting: Oncology

## 2023-03-25 NOTE — Telephone Encounter (Signed)
03/25/23 unable to leave VM.

## 2023-03-28 ENCOUNTER — Inpatient Hospital Stay: Payer: Medicaid Other | Admitting: Oncology

## 2023-03-28 NOTE — Progress Notes (Incomplete)
[facility]  [address] [place],  [place]  [address] [phone]  Clinic Day:  03/28/2023  Referring physician: Donata Duff, MD  HISTORY OF PRESENT ILLNESS:  The patient is a 47 y.o. female with stage IVA (T4 N2 M1a) lung adenocarcinoma.  The stage IVA designation was based upon her initially having a malignant pleural effusion.  She had no evidence of distant metastasis.  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  All scans to date have shown no evidence of disease recurrence.  The patient comes in today as she was recently in the emergency room for what appeared to have been a transient ischemic attack.  A head CT done at that time did not reveal any CNS disease present.  Furthermore, the patient underwent a brain MRI at [facility] yesterday, whose results came back negative for CNS involvement.  The patient claims her memory has not been particularly sharp.  She now relies upon others to drive her.  At the time that she had a TIA, she claims to have had right-sided facial drooping.  As it pertains to her lung cancer diagnosis, she denies having any new respiratory symptoms which concern her for disease recurrence.  Unfortunately, she continues to smoke a few cigarettes daily.   PHYSICAL EXAM:  There were no vitals taken for this visit. Wt Readings from Last 3 Encounters:  02/12/23 139 lb 11.2 oz (63.4 kg)  11/13/22 138 lb 9.6 oz (62.9 kg)  07/03/22 139 lb 11.2 oz (63.4 kg)   There is no height or weight on file to calculate BMI. Performance status (ECOG): 1 - Symptomatic but completely ambulatory Physical Exam Constitutional:      Appearance: Normal appearance. She is not ill-appearing.  HENT:     Mouth/Throat:     Mouth: Mucous membranes are moist.     Pharynx: Oropharynx is clear. No oropharyngeal exudate or posterior oropharyngeal erythema.  Cardiovascular:     Rate and  Rhythm: Normal rate and regular rhythm.     Heart sounds: No murmur heard.    No friction rub. No gallop.  Pulmonary:     Effort: Pulmonary effort is normal. No respiratory distress.     Breath sounds: Normal breath sounds. No wheezing, rhonchi or rales.  Abdominal:     General: Bowel sounds are normal. There is no distension.     Palpations: Abdomen is soft. There is no mass.     Tenderness: There is no abdominal tenderness.  Musculoskeletal:        General: No swelling.     Right lower leg: No edema.     Left lower leg: No edema.  Lymphadenopathy:     Cervical: No cervical adenopathy.     Upper Body:     Right upper body: No supraclavicular or axillary adenopathy.     Left upper body: No supraclavicular or axillary adenopathy.     Lower Body: No right inguinal adenopathy. No left inguinal adenopathy.  Skin:    General: Skin is warm.     Coloration: Skin is not jaundiced.     Findings: No lesion or rash.  Neurological:     General: No focal deficit present.     Mental Status: She is alert and oriented to person, place, and time. Mental status is at baseline.  Psychiatric:        Mood and Affect: Mood normal.  Behavior: Behavior normal.        Thought Content: Thought content normal.    SCANS: CT scans of her chest/abdomen/pelvis include the following: FINDINGS: CT CHEST FINDINGS  Cardiovascular: Normal caliber thoracic aorta. Normal caliber central pulmonary arteries. Normal size heart. No significant pericardial effusion/thickening  Mediastinum/Nodes: No suspicious thyroid nodule. Calcified mediastinal and hilar lymph nodes. No pathologically enlarged mediastinal, hilar or axillary lymph nodes. The esophagus is grossly unremarkable.  Lungs/Pleura: Nodular/banded opacity in the right upper lobe extending from the hilum with architectural distortion, volume loss and bronchiectasis/bronchiectasis, a dominant nodular area measures 16 x 13 mm on image 46/301  previously 14 x 13 mm, although the entire opacity appears similar to multiple prior studies when evaluated on coronal imaging, and the increase in size is favored secondary to orthogonal plane/slice selection.  New 4 mm right upper lobe pulmonary nodule on image 301.  Centrilobular and paraseptal emphysema. Scattered linear bands of atelectasis/scarring. No pleural effusion. No pneumothorax.  Musculoskeletal: Similar appearance of the sclerotic focus in the inferior left lateral T6 vertebral body at the costovertebral joint unchanged dating back to at least 2022 CTA chest and not hypermetabolic on prior PET examination. No new aggressive lytic or blastic lesion of bone.  CT ABDOMEN PELVIS FINDINGS  Hepatobiliary: Unremarkable noncontrast enhanced appearance of the hepatic parenchyma. Gallbladder is unremarkable. No biliary ductal dilation.  Pancreas: No pancreatic ductal dilation or evidence of acute inflammation.  Spleen: No splenomegaly.  Adrenals/Urinary Tract: Bilateral adrenal glands are unchanged. No hydronephrosis. No renal, ureteral or bladder calculi. Urinary bladder is unremarkable for degree of distension.  Stomach/Bowel: Radiopaque enteric contrast material traverses the rectum. Stomach is unremarkable for degree of distension. No pathologic dilation of small or large bowel. No evidence of acute bowel inflammation  Vascular/Lymphatic: Scattered aortic atherosclerosis. Normal caliber abdominal aorta. Smooth IVC contours. No pathologically enlarged abdominal or pelvic lymph nodes.  Reproductive: Uterus and bilateral adnexa are unremarkable.  Other: No significant abdominopelvic free fluid.  Musculoskeletal: Sclerotic focus in the posterior aspect of the T2 vertebral body on image 96/602 present dating back to PET-CT December 12, 2020 and not hypermetabolic on that examination. No new suspicious lytic or blastic lesions of bone.  IMPRESSION: 1. New 4 mm right  upper lobe pulmonary nodule, suggest attention on short-term interval follow-up dedicated chest CT. 2. A dominant nodular focus within the posttreatment change in the right upper lobe measures increased in size on axial imaging although when the entire opacity is evaluated in coronal plane it appears similar to multiple prior examinations, and as such the increase in size is favored secondary to measurement in orthogonal plane/slice selection. Suggest attention on short-term interval follow-up imaging. 3. No noncontrast CT evidence of metastatic disease in the abdomen or pelvis. 4. Similar appearance of the sclerotic foci in the T2 and T6 vertebral bodies dating back to at least 2022 CTA chest and not hypermetabolic on prior PET examination. 5. Aortic Atherosclerosis (ICD10-I70.0) and Emphysema (ICD10-J43.9). ----------------------------------------------------------------------------------------------------------- FINDINGS:  Calvarium/skull base: Within normal limits.  Paranasal sinuses: Within normal limits.  Brain: There is grossly normal flow-related signal within the major intracranial arterial vasculature. No midline shift. No acute hydrocephalus. No evidence of acute ischemia. Minimal scattered T2/FLAIR signal. No suspicious T2/FLAIR signal. No evidence of acute intracranial hemorrhage. No evidence of abnormal intracranial enhancement. No evidence of mass lesion.   Additional comments: None.   IMPRESSION:  1. No evidence of intracranial metastatic disease.  2. No acute findings.   ASSESSMENT & PLAN:  Assessment/Plan:  A 47 y.o. 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   In clinic today, I reassured her that her neurological symptoms are not due to her osimertinib oral therapy.  There was nothing per her physical exam today to suggest any neurological deficits were present.  Furthermore, I reviewed her brain MRI done yesterday with her, which  came back unremarkable for any CNS metastatic disease.  If this patient was to develop any more neurological symptoms over these next few weeks, I would recommend that she undergo a lumbar puncture to ensure no malignant cells are floating throughout her CNS, which could be causing some of her transient neurological changes.  Otherwise, the patient knows to continue taking osimertinib on a daily basis.  She is already scheduled to see me next month, with a chest CT being done a day before her next visit for her continued lung cancer surveillance.  The patient understands all the plans discussed today and is in agreement with them.    Trejan Buda Kirby Funk, MD

## 2023-03-28 NOTE — Progress Notes (Deleted)
[facility]  [address] [place],  [place]  [address] [phone]  Clinic Day:  03/28/2023  Referring physician: Donata Duff, MD  HISTORY OF PRESENT ILLNESS:  The patient is a 47 y.o. female with stage IVA (T4 N2 M1a) lung adenocarcinoma.  The stage IVA designation was based upon her initially having a malignant pleural effusion.  She had no evidence of distant metastasis.  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  All scans to date have shown no evidence of disease recurrence.  The patient comes in today as she was recently in the emergency room for what appeared to have been a transient ischemic attack.  A head CT done at that time did not reveal any CNS disease present.  Furthermore, the patient underwent a brain MRI at [facility] yesterday, whose results came back negative for CNS involvement.  The patient claims her memory has not been particularly sharp.  She now relies upon others to drive her.  At the time that she had a TIA, she claims to have had right-sided facial drooping.  As it pertains to her lung cancer diagnosis, she denies having any new respiratory symptoms which concern her for disease recurrence.  Unfortunately, she continues to smoke a few cigarettes daily.   PHYSICAL EXAM:  There were no vitals taken for this visit. Wt Readings from Last 3 Encounters:  02/12/23 139 lb 11.2 oz (63.4 kg)  11/13/22 138 lb 9.6 oz (62.9 kg)  07/03/22 139 lb 11.2 oz (63.4 kg)   There is no height or weight on file to calculate BMI. Performance status (ECOG): 1 - Symptomatic but completely ambulatory Physical Exam Constitutional:      Appearance: Normal appearance. She is not ill-appearing.  HENT:     Mouth/Throat:     Mouth: Mucous membranes are moist.     Pharynx: Oropharynx is clear. No oropharyngeal exudate or posterior oropharyngeal erythema.  Cardiovascular:     Rate and  Rhythm: Normal rate and regular rhythm.     Heart sounds: No murmur heard.    No friction rub. No gallop.  Pulmonary:     Effort: Pulmonary effort is normal. No respiratory distress.     Breath sounds: Normal breath sounds. No wheezing, rhonchi or rales.  Abdominal:     General: Bowel sounds are normal. There is no distension.     Palpations: Abdomen is soft. There is no mass.     Tenderness: There is no abdominal tenderness.  Musculoskeletal:        General: No swelling.     Right lower leg: No edema.     Left lower leg: No edema.  Lymphadenopathy:     Cervical: No cervical adenopathy.     Upper Body:     Right upper body: No supraclavicular or axillary adenopathy.     Left upper body: No supraclavicular or axillary adenopathy.     Lower Body: No right inguinal adenopathy. No left inguinal adenopathy.  Skin:    General: Skin is warm.     Coloration: Skin is not jaundiced.     Findings: No lesion or rash.  Neurological:     General: No focal deficit present.     Mental Status: She is alert and oriented to person, place, and time. Mental status is at baseline.  Psychiatric:        Mood and Affect: Mood normal.  Behavior: Behavior normal.        Thought Content: Thought content normal.    SCANS: CT scans of her chest/abdomen/pelvis include the following: FINDINGS: CT CHEST FINDINGS  Cardiovascular: Normal caliber thoracic aorta. Normal caliber central pulmonary arteries. Normal size heart. No significant pericardial effusion/thickening  Mediastinum/Nodes: No suspicious thyroid nodule. Calcified mediastinal and hilar lymph nodes. No pathologically enlarged mediastinal, hilar or axillary lymph nodes. The esophagus is grossly unremarkable.  Lungs/Pleura: Nodular/banded opacity in the right upper lobe extending from the hilum with architectural distortion, volume loss and bronchiectasis/bronchiectasis, a dominant nodular area measures 16 x 13 mm on image 46/301  previously 14 x 13 mm, although the entire opacity appears similar to multiple prior studies when evaluated on coronal imaging, and the increase in size is favored secondary to orthogonal plane/slice selection.  New 4 mm right upper lobe pulmonary nodule on image 301.  Centrilobular and paraseptal emphysema. Scattered linear bands of atelectasis/scarring. No pleural effusion. No pneumothorax.  Musculoskeletal: Similar appearance of the sclerotic focus in the inferior left lateral T6 vertebral body at the costovertebral joint unchanged dating back to at least 2022 CTA chest and not hypermetabolic on prior PET examination. No new aggressive lytic or blastic lesion of bone.  CT ABDOMEN PELVIS FINDINGS  Hepatobiliary: Unremarkable noncontrast enhanced appearance of the hepatic parenchyma. Gallbladder is unremarkable. No biliary ductal dilation.  Pancreas: No pancreatic ductal dilation or evidence of acute inflammation.  Spleen: No splenomegaly.  Adrenals/Urinary Tract: Bilateral adrenal glands are unchanged. No hydronephrosis. No renal, ureteral or bladder calculi. Urinary bladder is unremarkable for degree of distension.  Stomach/Bowel: Radiopaque enteric contrast material traverses the rectum. Stomach is unremarkable for degree of distension. No pathologic dilation of small or large bowel. No evidence of acute bowel inflammation  Vascular/Lymphatic: Scattered aortic atherosclerosis. Normal caliber abdominal aorta. Smooth IVC contours. No pathologically enlarged abdominal or pelvic lymph nodes.  Reproductive: Uterus and bilateral adnexa are unremarkable.  Other: No significant abdominopelvic free fluid.  Musculoskeletal: Sclerotic focus in the posterior aspect of the T2 vertebral body on image 96/602 present dating back to PET-CT December 12, 2020 and not hypermetabolic on that examination. No new suspicious lytic or blastic lesions of bone.  IMPRESSION: 1. New 4 mm right  upper lobe pulmonary nodule, suggest attention on short-term interval follow-up dedicated chest CT. 2. A dominant nodular focus within the posttreatment change in the right upper lobe measures increased in size on axial imaging although when the entire opacity is evaluated in coronal plane it appears similar to multiple prior examinations, and as such the increase in size is favored secondary to measurement in orthogonal plane/slice selection. Suggest attention on short-term interval follow-up imaging. 3. No noncontrast CT evidence of metastatic disease in the abdomen or pelvis. 4. Similar appearance of the sclerotic foci in the T2 and T6 vertebral bodies dating back to at least 2022 CTA chest and not hypermetabolic on prior PET examination. 5. Aortic Atherosclerosis (ICD10-I70.0) and Emphysema (ICD10-J43.9). ----------------------------------------------------------------------------------------------------------- FINDINGS:  Calvarium/skull base: Within normal limits.  Paranasal sinuses: Within normal limits.  Brain: There is grossly normal flow-related signal within the major intracranial arterial vasculature. No midline shift. No acute hydrocephalus. No evidence of acute ischemia. Minimal scattered T2/FLAIR signal. No suspicious T2/FLAIR signal. No evidence of acute intracranial hemorrhage. No evidence of abnormal intracranial enhancement. No evidence of mass lesion.   Additional comments: None.   IMPRESSION:  1. No evidence of intracranial metastatic disease.  2. No acute findings.   ASSESSMENT & PLAN:  Assessment/Plan:  A 47 y.o. 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   In clinic today, I reassured her that her neurological symptoms are not due to her osimertinib oral therapy.  There was nothing per her physical exam today to suggest any neurological deficits were present.  Furthermore, I reviewed her brain MRI done yesterday with her, which  came back unremarkable for any CNS metastatic disease.  If this patient was to develop any more neurological symptoms over these next few weeks, I would recommend that she undergo a lumbar puncture to ensure no malignant cells are floating throughout her CNS, which could be causing some of her transient neurological changes.  Otherwise, the patient knows to continue taking osimertinib on a daily basis.  She is already scheduled to see me next month, with a chest CT being done a day before her next visit for her continued lung cancer surveillance.  The patient understands all the plans discussed today and is in agreement with them.     Kirby Funk, MD

## 2023-04-01 ENCOUNTER — Inpatient Hospital Stay: Payer: Medicaid Other | Admitting: Oncology

## 2023-04-01 VITALS — BP 140/65 | HR 82 | Temp 98.9°F | Resp 18 | Ht 64.0 in | Wt 136.9 lb

## 2023-04-01 DIAGNOSIS — C3411 Malignant neoplasm of upper lobe, right bronchus or lung: Secondary | ICD-10-CM | POA: Diagnosis not present

## 2023-04-01 NOTE — Progress Notes (Signed)
[facility]  [address] [place],  [place]  [address] [phone]  Clinic Day: 04/01/2023   Referring physician: Donata Duff, MD  HISTORY OF PRESENT ILLNESS:  The patient is a 47 y.o. female with stage IVA (T4 N2 M1a) lung adenocarcinoma.  The stage IVA designation was based upon her initially having a malignant pleural effusion.  She had no evidence of distant metastasis.  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  The patient comes in today to go over her recent CT scan images to ascertain her new disease baseline.  Since her last visit, the patient has been doing fine.  She has had some vision problems recently, but a recent head CT and brain MRI did not reveal any CNS disease present.  She admits she has not had an eye exam recently.  As it pertains to her lung cancer diagnosis, she denies having any new respiratory symptoms which concern her for disease recurrence.  Unfortunately, she continues to smoke a few cigarettes daily.   PHYSICAL EXAM:  Blood pressure (!) 140/65, pulse 82, temperature 98.9 F (37.2 C), resp. rate 18, height 5\' 4"  (1.626 m), weight 136 lb 14.4 oz (62.1 kg), SpO2 99%. Wt Readings from Last 3 Encounters:  04/01/23 136 lb 14.4 oz (62.1 kg)  02/12/23 139 lb 11.2 oz (63.4 kg)  11/13/22 138 lb 9.6 oz (62.9 kg)   Body mass index is 23.5 kg/m. Performance status (ECOG): 1 - Symptomatic but completely ambulatory Physical Exam Constitutional:      Appearance: Normal appearance. She is not ill-appearing.  HENT:     Mouth/Throat:     Mouth: Mucous membranes are moist.     Pharynx: Oropharynx is clear. No oropharyngeal exudate or posterior oropharyngeal erythema.  Cardiovascular:     Rate and Rhythm: Normal rate and regular rhythm.     Heart sounds: No murmur heard.    No friction rub. No gallop.  Pulmonary:     Effort: Pulmonary effort is normal. No respiratory  distress.     Breath sounds: Normal breath sounds. No wheezing, rhonchi or rales.  Abdominal:     General: Bowel sounds are normal. There is no distension.     Palpations: Abdomen is soft. There is no mass.     Tenderness: There is no abdominal tenderness.  Musculoskeletal:        General: No swelling.     Right lower leg: No edema.     Left lower leg: No edema.  Lymphadenopathy:     Cervical: No cervical adenopathy.     Upper Body:     Right upper body: No supraclavicular or axillary adenopathy.     Left upper body: No supraclavicular or axillary adenopathy.     Lower Body: No right inguinal adenopathy. No left inguinal adenopathy.  Skin:    General: Skin is warm.     Coloration: Skin is not jaundiced.     Findings: No lesion or rash.  Neurological:     General: No focal deficit present.     Mental Status: She is alert and oriented to person, place, and time. Mental status is at baseline.  Psychiatric:        Mood and Affect: Mood normal.        Behavior: Behavior normal.        Thought Content: Thought content normal.    SCANS: CT scans of her chest/abdomen/pelvis include  the following: FINDINGS: CT CHEST FINDINGS  Cardiovascular: Normal caliber thoracic aorta. Normal caliber central pulmonary arteries. Normal size heart. No significant pericardial effusion/thickening  Mediastinum/Nodes: No suspicious thyroid nodule. Calcified mediastinal and hilar lymph nodes. No pathologically enlarged mediastinal, hilar or axillary lymph nodes. The esophagus is grossly unremarkable.  Lungs/Pleura: Nodular/banded opacity in the right upper lobe extending from the hilum with architectural distortion, volume loss and bronchiectasis/bronchiectasis, a dominant nodular area measures 16 x 13 mm on image 46/301 previously 14 x 13 mm, although the entire opacity appears similar to multiple prior studies when evaluated on coronal imaging, and the increase in size is favored secondary to  orthogonal plane/slice selection.  New 4 mm right upper lobe pulmonary nodule on image 301.  Centrilobular and paraseptal emphysema. Scattered linear bands of atelectasis/scarring. No pleural effusion. No pneumothorax.  Musculoskeletal: Similar appearance of the sclerotic focus in the inferior left lateral T6 vertebral body at the costovertebral joint unchanged dating back to at least 2022 CTA chest and not hypermetabolic on prior PET examination. No new aggressive lytic or blastic lesion of bone.  CT ABDOMEN PELVIS FINDINGS  Hepatobiliary: Unremarkable noncontrast enhanced appearance of the hepatic parenchyma. Gallbladder is unremarkable. No biliary ductal dilation.  Pancreas: No pancreatic ductal dilation or evidence of acute inflammation.  Spleen: No splenomegaly.  Adrenals/Urinary Tract: Bilateral adrenal glands are unchanged. No hydronephrosis. No renal, ureteral or bladder calculi. Urinary bladder is unremarkable for degree of distension.  Stomach/Bowel: Radiopaque enteric contrast material traverses the rectum. Stomach is unremarkable for degree of distension. No pathologic dilation of small or large bowel. No evidence of acute bowel inflammation  Vascular/Lymphatic: Scattered aortic atherosclerosis. Normal caliber abdominal aorta. Smooth IVC contours. No pathologically enlarged abdominal or pelvic lymph nodes.  Reproductive: Uterus and bilateral adnexa are unremarkable.  Other: No significant abdominopelvic free fluid.  Musculoskeletal: Sclerotic focus in the posterior aspect of the T2 vertebral body on image 96/602 present dating back to PET-CT December 12, 2020 and not hypermetabolic on that examination. No new suspicious lytic or blastic lesions of bone.  IMPRESSION: 1. New 4 mm right upper lobe pulmonary nodule, suggest attention on short-term interval follow-up dedicated chest CT. 2. A dominant nodular focus within the posttreatment change in the right  upper lobe measures increased in size on axial imaging although when the entire opacity is evaluated in coronal plane it appears similar to multiple prior examinations, and as such the increase in size is favored secondary to measurement in orthogonal plane/slice selection. Suggest attention on short-term interval follow-up imaging. 3. No noncontrast CT evidence of metastatic disease in the abdomen or pelvis. 4. Similar appearance of the sclerotic foci in the T2 and T6 vertebral bodies dating back to at least 2022 CTA chest and not hypermetabolic on prior PET examination. 5. Aortic Atherosclerosis (ICD10-I70.0) and Emphysema (ICD10-J43.9).  ASSESSMENT & PLAN:  Assessment/Plan:  A 47 y.o. 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   In clinic today, I went over her CT scan images with her, for which she could see there remains no obvious evidence of disease recurrence.  Clinically, she continues to do very well.  I still stressed the importance of complete smoking abstinence as to not lead to another aerodigestive malignancy from developing. Otherwise, the patient knows to continue taking osimertinib on a daily basis.  I will see her back in 4 months for repeat clinical assessment.  A repeat chest CT will be done a day before her next visit for  her continued radiographic lung cancer surveillance.   The patient understands all the plans discussed today and is in agreement with them.     Kirby Funk, MD

## 2023-04-02 ENCOUNTER — Encounter: Payer: Self-pay | Admitting: Oncology

## 2023-04-03 NOTE — Telephone Encounter (Signed)
Created in error

## 2023-04-09 ENCOUNTER — Other Ambulatory Visit: Payer: Self-pay | Admitting: Oncology

## 2023-04-09 ENCOUNTER — Telehealth: Payer: Self-pay

## 2023-04-09 DIAGNOSIS — C3411 Malignant neoplasm of upper lobe, right bronchus or lung: Secondary | ICD-10-CM

## 2023-04-09 NOTE — Telephone Encounter (Signed)
Patient called stating that she is having the same pain between her shoulders/back-this time is not relieving. Also states that she is nauseated and was having some watery stools. When she got very stressed the other day. She said she ended up with chest pain and some heart fluttering. Would like to know what we advise her to do?

## 2023-04-09 NOTE — Telephone Encounter (Signed)
Patient informed about seeing her PCP.

## 2023-04-10 ENCOUNTER — Telehealth: Payer: Self-pay

## 2023-04-10 NOTE — Telephone Encounter (Addendum)
Pt called and is frustrated with her PCP, which is Dr Francee Piccolo in [place]. "Dr Melvyn Neth told me to get a primary care doctor 1.5 years ago and I did. He hasn't done anything at all that he said he was going to do. He told me he was going to set me up for mammogram, colonoscopy and with a cardiologist. They haven't done any of that. No one has called me. When I called back to ask about the appt's, they state the hospital was to call her with the appt's. I called [facility] and they told me they didn't have any orders. I called the office back and was told they would get the appt's scheduled. I've contacted a lawyer because they aren't taking care of me. I am a cancer pt, I had a stroke last month. I need someone to take care of me. I told Dr Marge Duncans office that I wanted a referral to another primary care doctor. They told me they don't do that".  I told pt she would have to find a primary care doctor, call and schedule new appt. Pt states, "It's hard to find doctor that will take medicaid. I don't know what else to do".

## 2023-04-23 ENCOUNTER — Other Ambulatory Visit (HOSPITAL_COMMUNITY): Payer: Self-pay

## 2023-04-26 ENCOUNTER — Other Ambulatory Visit (HOSPITAL_COMMUNITY): Payer: Self-pay

## 2023-05-01 NOTE — Progress Notes (Unsigned)
[facility]  [address] [place],  [place]  [address] [phone]  Clinic Day: 05/02/2023   Referring physician: Donata Duff, MD  HISTORY OF PRESENT ILLNESS:  The patient is a 47 y.o. female with stage IVA (T4 N2 M1a) lung adenocarcinoma.  The stage IVA designation was based upon her initially having a malignant pleural effusion.  She had no evidence of distant metastasis.  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  Recent scans showed her to have no obvious evidence of persistent disease.  She comes in off-schedule due to multiple complaints, including small knots on her right forearm, as well as morning nausea/vomiting.  She also complains of soft, but sluggish, stools.  She is scheduled to see GI in the forthcoming weeks to discuss having a colonoscopy done.    PHYSICAL EXAM:  Blood pressure 138/82, pulse 76, temperature 98.8 F (37.1 C), resp. rate 16, height 5\' 4"  (1.626 m), weight 137 lb 8 oz (62.4 kg), SpO2 100%. Wt Readings from Last 3 Encounters:  05/02/23 137 lb 8 oz (62.4 kg)  04/01/23 136 lb 14.4 oz (62.1 kg)  02/12/23 139 lb 11.2 oz (63.4 kg)   Body mass index is 23.6 kg/m. Performance status (ECOG): 1 - Symptomatic but completely ambulatory Physical Exam Constitutional:      Appearance: Normal appearance. She is not ill-appearing.  HENT:     Mouth/Throat:     Mouth: Mucous membranes are moist.     Pharynx: Oropharynx is clear. No oropharyngeal exudate or posterior oropharyngeal erythema.  Cardiovascular:     Rate and Rhythm: Normal rate and regular rhythm.     Heart sounds: No murmur heard.    No friction rub. No gallop.  Pulmonary:     Effort: Pulmonary effort is normal. No respiratory distress.     Breath sounds: Normal breath sounds. No wheezing, rhonchi or rales.  Abdominal:     General: Bowel sounds are normal. There is no distension.     Palpations: Abdomen  is soft. There is no mass.     Tenderness: There is no abdominal tenderness.  Musculoskeletal:        General: No swelling.     Right lower leg: No edema.     Left lower leg: No edema.  Lymphadenopathy:     Cervical: No cervical adenopathy.     Upper Body:     Right upper body: No supraclavicular or axillary adenopathy.     Left upper body: No supraclavicular or axillary adenopathy.     Lower Body: No right inguinal adenopathy. No left inguinal adenopathy.  Skin:    General: Skin is warm.     Coloration: Skin is not jaundiced.     Findings: No lesion or rash.  Neurological:     General: No focal deficit present.     Mental Status: She is alert and oriented to person, place, and time. Mental status is at baseline.  Psychiatric:        Mood and Affect: Mood normal.        Behavior: Behavior normal.        Thought Content: Thought content normal.    ASSESSMENT & PLAN:  Assessment/Plan:  A 47 y.o. 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   Overall, there was nothing per her physical exam nor history that particularly concerned me for disease recurrence.  As mentioned previously,  recent CT scans showed no distant or locoregional disease. I reassured the patient that she is doing well and that I have no concern about disease recurrence at this time.  She knows to keep her future follow-up appointments with me, for which a repeat chest CT will be done a day before her next visit for her continued radiographic lung cancer surveillance.   The patient understands all the plans discussed today and is in agreement with them.    Cole Eastridge Kirby Funk, MD

## 2023-05-02 ENCOUNTER — Other Ambulatory Visit: Payer: Self-pay | Admitting: Oncology

## 2023-05-02 ENCOUNTER — Inpatient Hospital Stay: Payer: Medicaid Other | Attending: Oncology | Admitting: Oncology

## 2023-05-02 VITALS — BP 138/82 | HR 76 | Temp 98.8°F | Resp 16 | Ht 64.0 in | Wt 137.5 lb

## 2023-05-02 DIAGNOSIS — C3411 Malignant neoplasm of upper lobe, right bronchus or lung: Secondary | ICD-10-CM

## 2023-05-02 NOTE — Progress Notes (Signed)
Patient c/o "white" spots over "cysts" in arm and on head that are painful to touch.  Unexplained weight loss, states her clothes are bigger.  Loose stools that shw is having trouble getting to come out and vomiting in am.

## 2023-05-20 ENCOUNTER — Other Ambulatory Visit (HOSPITAL_COMMUNITY): Payer: Self-pay

## 2023-05-20 ENCOUNTER — Other Ambulatory Visit: Payer: Self-pay | Admitting: Hematology and Oncology

## 2023-05-22 ENCOUNTER — Other Ambulatory Visit: Payer: Self-pay | Admitting: Hematology and Oncology

## 2023-05-22 ENCOUNTER — Other Ambulatory Visit: Payer: Self-pay

## 2023-05-24 ENCOUNTER — Other Ambulatory Visit: Payer: Self-pay | Admitting: Hematology and Oncology

## 2023-05-24 ENCOUNTER — Other Ambulatory Visit: Payer: Self-pay

## 2023-05-24 ENCOUNTER — Other Ambulatory Visit (HOSPITAL_COMMUNITY): Payer: Self-pay

## 2023-05-24 MED ORDER — OSIMERTINIB MESYLATE 80 MG PO TABS
80.0000 mg | ORAL_TABLET | Freq: Every day | ORAL | 3 refills | Status: DC
  Filled 2023-05-24: qty 30, 30d supply, fill #0
  Filled 2023-06-24: qty 30, 30d supply, fill #1
  Filled 2023-07-16: qty 30, 30d supply, fill #2
  Filled 2023-08-16: qty 30, 30d supply, fill #3

## 2023-05-27 ENCOUNTER — Other Ambulatory Visit: Payer: Self-pay

## 2023-06-14 ENCOUNTER — Other Ambulatory Visit: Payer: Self-pay

## 2023-06-18 ENCOUNTER — Other Ambulatory Visit (HOSPITAL_COMMUNITY): Payer: Self-pay

## 2023-06-18 ENCOUNTER — Encounter: Payer: Self-pay | Admitting: Physician Assistant

## 2023-06-20 IMAGING — US US PELVIS COMPLETE WITH TRANSVAGINAL
1 series · 13 of 25 positions shown · non-contrast
Comparison: None Available.

CLINICAL DATA: Initial evaluation for postmenopausal bleeding.

EXAM:
TRANSABDOMINAL AND TRANSVAGINAL ULTRASOUND OF PELVIS
TECHNIQUE: Both transabdominal and transvaginal ultrasound examinations of the
pelvis were performed. Transabdominal technique was performed for
global imaging of the pelvis including uterus, ovaries, adnexal
regions, and pelvic cul-de-sac. It was necessary to proceed with
endovaginal exam following the transabdominal exam to visualize the
endometrium and ovaries.

[Series 1: us pelvis complete with transvaginal · 13 of 67 slices shown]
[im 1/67]
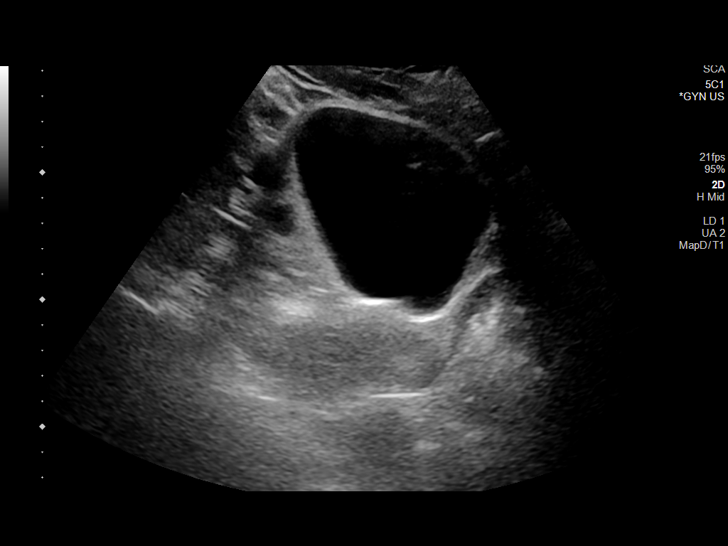
[im 6/67]
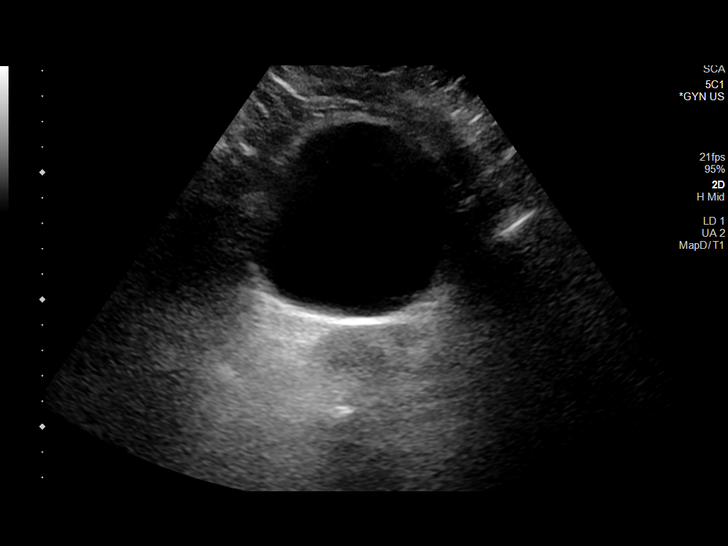
[im 12/67]
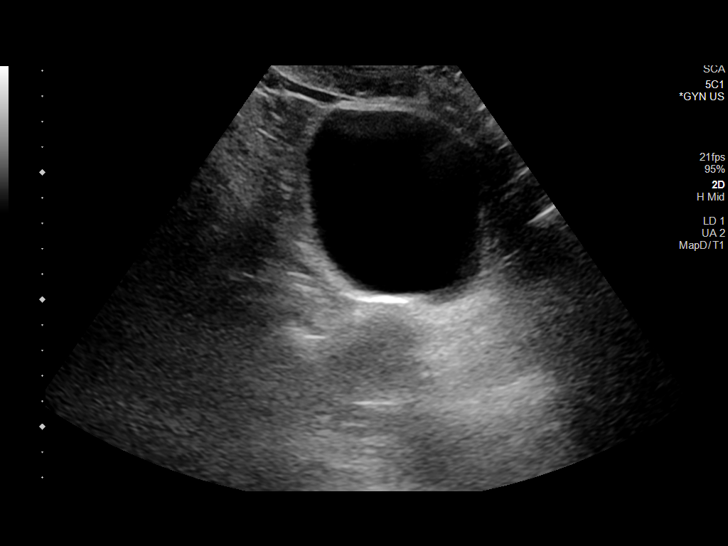
[im 17/67]
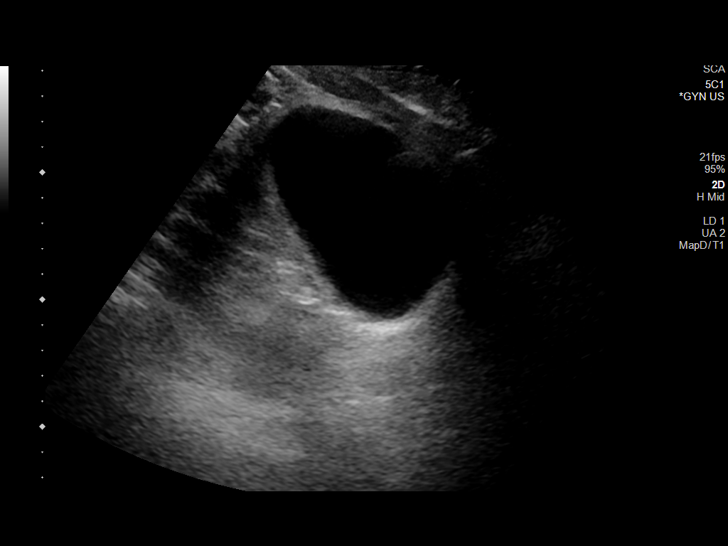
[im 23/67]
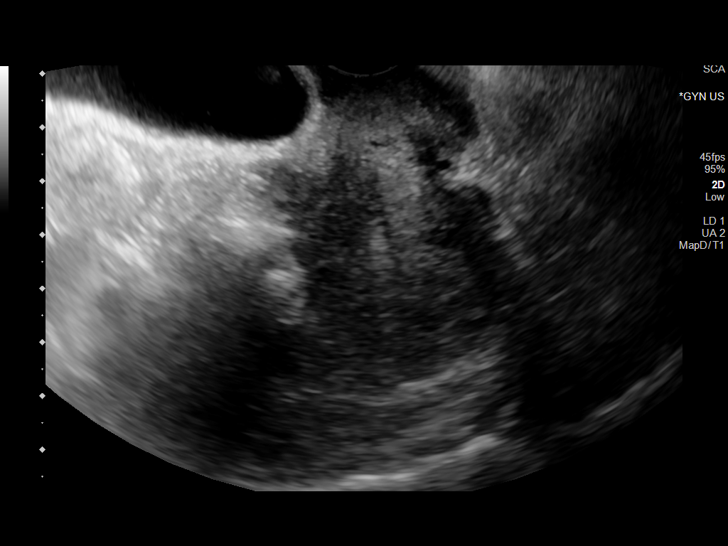
[im 28/67]
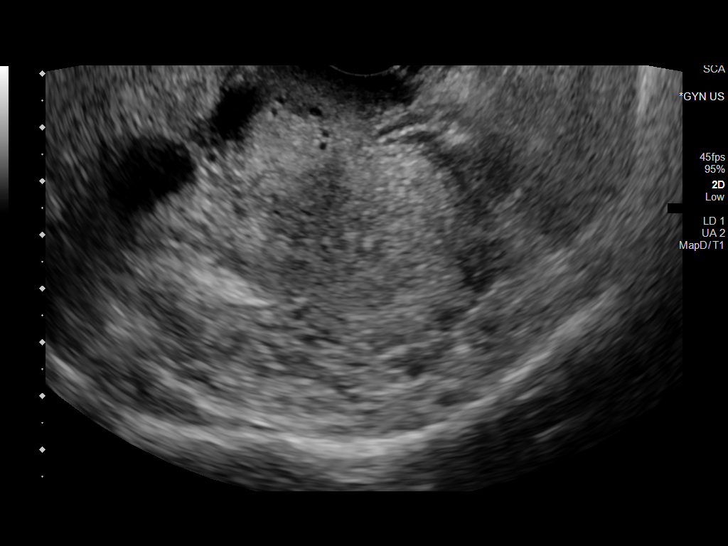
[im 34/67]
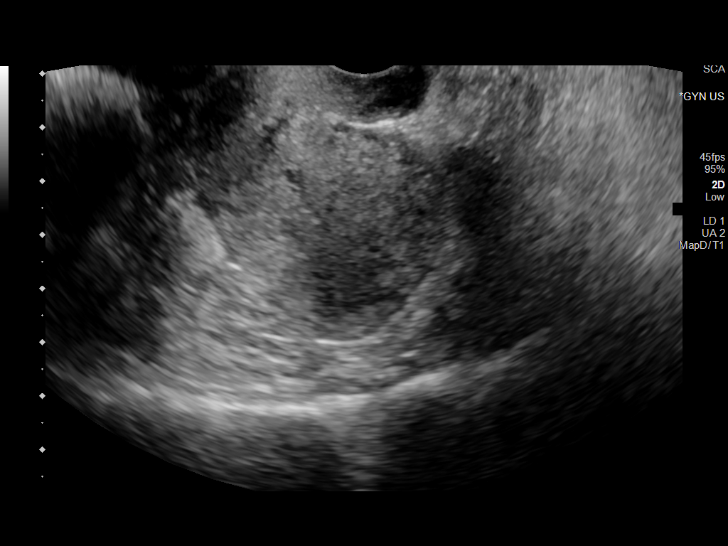
[im 39/67]
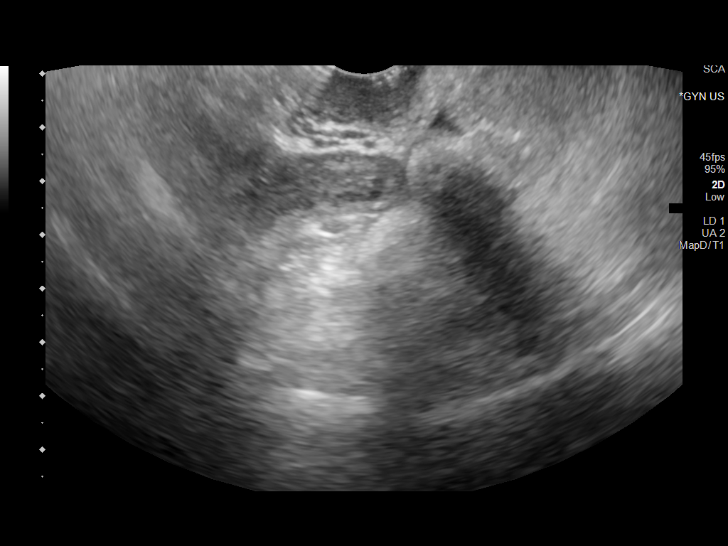
[im 45/67]
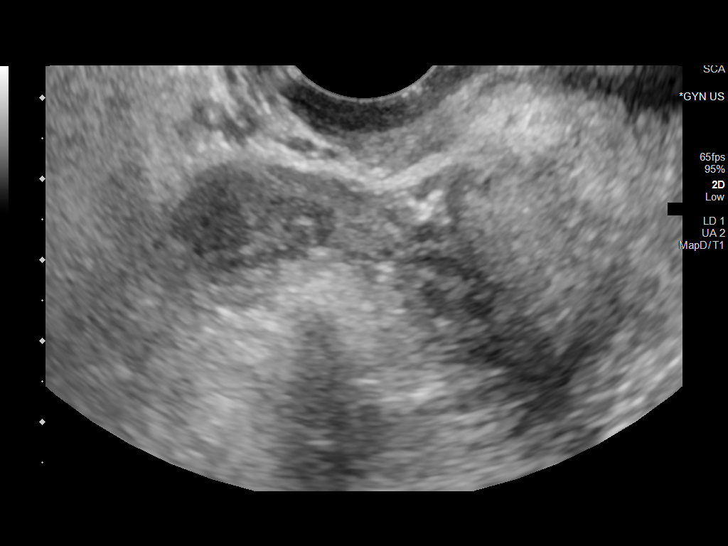
[im 50/67]
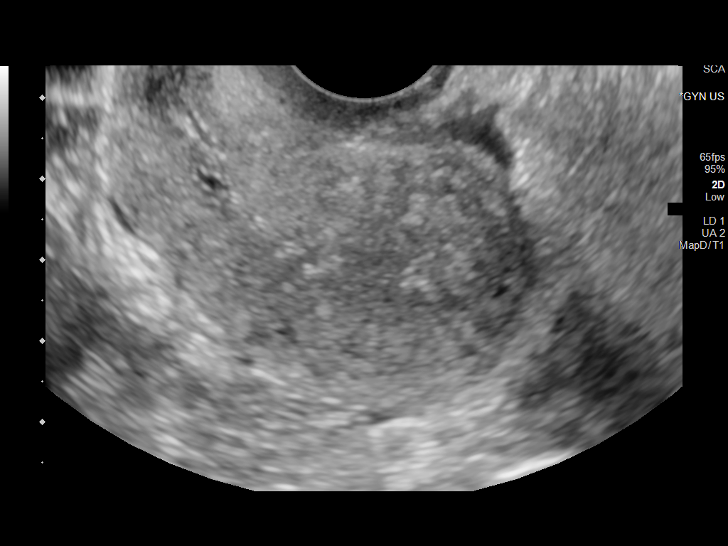
[im 56/67]
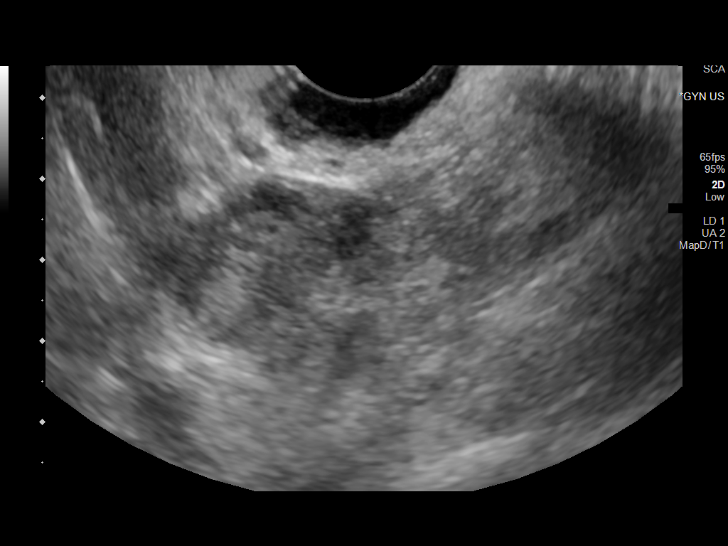
[im 61/67]
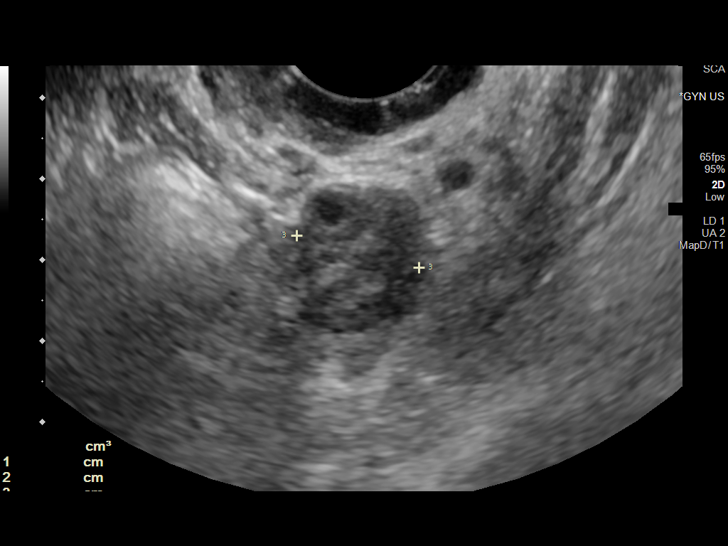
[im 67/67]
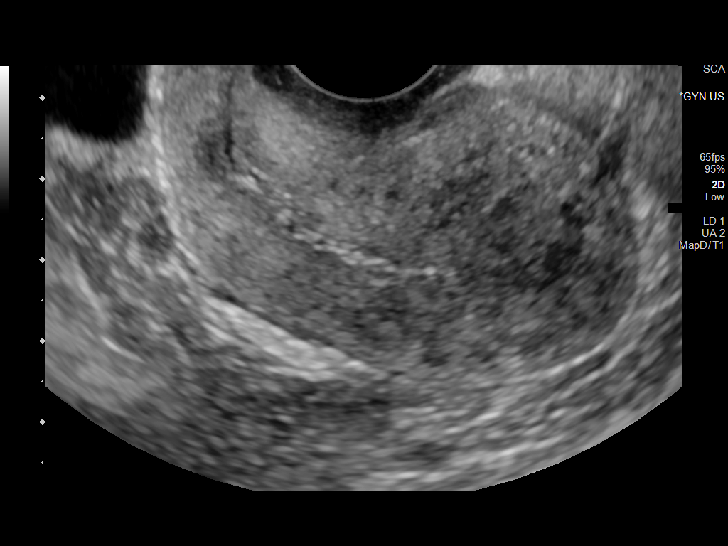

[13 of 25 positions shown; findings below may reference images not displayed]

FINDINGS: Uterus

Measurements: 6.3 x 3.8 x 4.8 cm = volume: 59.5 mL. Uterus is
retroverted. Heterogeneous echotexture seen within the uterine
myometrium without discrete fibroid or other myometrial abnormality.

Endometrium

Thickness: 3.5 mm.  No focal abnormality visualized.

Right ovary

Measurements: 2.6 x 1.3 x 2.4 cm = volume: 4.2 mL. Normal
appearance/no adnexal mass.

Left ovary

Measurements: 2.7 x 1.6 x 1.6 cm = volume: 3.6 mL. Normal
appearance/no adnexal mass.

Other findings

No abnormal free fluid.
IMPRESSION: 1. Endometrial stripe within normal limits measuring 3.5 mm in
thickness. In the setting of post-menopausal bleeding, this is
consistent with a benign etiology such as endometrial atrophy. If
bleeding remains unresponsive to hormonal or medical therapy,
sonohysterogram should be considered for focal lesion work-up. (Ref:
Radiological Reasoning: Algorithmic Workup of Abnormal Vaginal
Bleeding with Endovaginal Sonography and Sonohysterography. AJR
0001; 191:S68-73).
2. Otherwise unremarkable pelvic ultrasound with normal sonographic
appearance of the uterus and ovaries. No adnexal mass or free fluid.

## 2023-06-21 ENCOUNTER — Encounter (HOSPITAL_COMMUNITY): Payer: Self-pay

## 2023-06-21 ENCOUNTER — Other Ambulatory Visit (HOSPITAL_COMMUNITY): Payer: Self-pay

## 2023-06-24 ENCOUNTER — Other Ambulatory Visit: Payer: Self-pay

## 2023-06-24 NOTE — Progress Notes (Signed)
Specialty Pharmacy Refill Coordination Note  Andrea Holloway is a 47 y.o. female contacted today regarding refills of specialty medication(s) Osimertinib Mesylate   Patient requested Delivery   Delivery date: 06/28/23   Verified address: [address]   [place] [address]   Medication will be filled on 06/27/23.

## 2023-07-15 ENCOUNTER — Telehealth: Payer: Self-pay | Admitting: Oncology

## 2023-07-15 NOTE — Telephone Encounter (Signed)
CT Chest has been scheduled for 08/06/23 @ 3:00; Check in @ 2-   LVM notifying pt of date,time and instructions.

## 2023-07-16 ENCOUNTER — Other Ambulatory Visit: Payer: Self-pay

## 2023-07-16 NOTE — Progress Notes (Signed)
Specialty Pharmacy Refill Coordination Note  [employer] is a 47 y.o. female contacted today regarding refills of specialty medication(s) Osimertinib Mesylate   Patient requested Delivery   Delivery date: 07/23/23   Verified address: [address] [place] [address]   Medication will be filled on 07/22/23.

## 2023-08-07 NOTE — Progress Notes (Unsigned)
[facility]  [address] [place],  [place]  [address] [phone]  Clinic Day: 05/02/2023   Referring physician: Donata Duff, MD  HISTORY OF PRESENT ILLNESS:  The patient is a 47 y.o. female with stage IVA (T4 N2 M1a) lung adenocarcinoma.  The stage IVA designation was based upon her initially having a malignant pleural effusion.  She had no evidence of distant metastasis.  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  Recent scans showed her to have no obvious evidence of persistent disease.  She comes in off-schedule due to multiple complaints, including small knots on her right forearm, as well as morning nausea/vomiting.  She also complains of soft, but sluggish, stools.  She is scheduled to see GI in the forthcoming weeks to discuss having a colonoscopy done.    PHYSICAL EXAM:  There were no vitals taken for this visit. Wt Readings from Last 3 Encounters:  05/02/23 137 lb 8 oz (62.4 kg)  04/01/23 136 lb 14.4 oz (62.1 kg)  02/12/23 139 lb 11.2 oz (63.4 kg)   There is no height or weight on file to calculate BMI. Performance status (ECOG): 1 - Symptomatic but completely ambulatory Physical Exam Constitutional:      Appearance: Normal appearance. She is not ill-appearing.  HENT:     Mouth/Throat:     Mouth: Mucous membranes are moist.     Pharynx: Oropharynx is clear. No oropharyngeal exudate or posterior oropharyngeal erythema.  Cardiovascular:     Rate and Rhythm: Normal rate and regular rhythm.     Heart sounds: No murmur heard.    No friction rub. No gallop.  Pulmonary:     Effort: Pulmonary effort is normal. No respiratory distress.     Breath sounds: Normal breath sounds. No wheezing, rhonchi or rales.  Abdominal:     General: Bowel sounds are normal. There is no distension.     Palpations: Abdomen is soft. There is no mass.     Tenderness: There is no abdominal tenderness.   Musculoskeletal:        General: No swelling.     Right lower leg: No edema.     Left lower leg: No edema.  Lymphadenopathy:     Cervical: No cervical adenopathy.     Upper Body:     Right upper body: No supraclavicular or axillary adenopathy.     Left upper body: No supraclavicular or axillary adenopathy.     Lower Body: No right inguinal adenopathy. No left inguinal adenopathy.  Skin:    General: Skin is warm.     Coloration: Skin is not jaundiced.     Findings: No lesion or rash.  Neurological:     General: No focal deficit present.     Mental Status: She is alert and oriented to person, place, and time. Mental status is at baseline.  Psychiatric:        Mood and Affect: Mood normal.        Behavior: Behavior normal.        Thought Content: Thought content normal.   ASSESSMENT & PLAN:  Assessment/Plan:  A 47 y.o. 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   Overall, there was nothing per her physical exam nor history that particularly concerned me for disease recurrence.  As mentioned previously, recent CT scans showed no distant or locoregional disease. I reassured the patient that she  is doing well and that I have no concern about disease recurrence at this time.  She knows to keep her future follow-up appointments with me, for which a repeat chest CT will be done a day before her next visit for her continued radiographic lung cancer surveillance.   The patient understands all the plans discussed today and is in agreement with them.    Severa Jeremiah Kirby Funk, MD        `

## 2023-08-08 ENCOUNTER — Other Ambulatory Visit (HOSPITAL_COMMUNITY): Payer: Self-pay

## 2023-08-08 ENCOUNTER — Other Ambulatory Visit: Payer: Self-pay

## 2023-08-08 ENCOUNTER — Inpatient Hospital Stay: Payer: Medicaid Other | Admitting: Oncology

## 2023-08-12 ENCOUNTER — Other Ambulatory Visit (HOSPITAL_COMMUNITY): Payer: Self-pay

## 2023-08-14 ENCOUNTER — Other Ambulatory Visit: Payer: Self-pay

## 2023-08-14 ENCOUNTER — Telehealth: Payer: Self-pay

## 2023-08-14 NOTE — Telephone Encounter (Signed)
Received call from [facility]-- pt has order for CT chest w/contrast. She has allergy to contrast, so they always do it without contrast. They need new order faxed to [phone]. Above message sent to Dr Melvyn Neth and Angelique Blonder B,scheduling.

## 2023-08-15 LAB — LAB REPORT - SCANNED: EGFR: 107

## 2023-08-15 LAB — HM MAMMOGRAPHY

## 2023-08-15 NOTE — Progress Notes (Signed)
 [facility]  [address] [place],  [place]  [address] [phone]  Clinic Day: 08/16/2023   Referring physician: Nelda Balsam, MD  HISTORY OF PRESENT ILLNESS:  The patient is a 47 y.o. female with stage IVA (T4 N2 M1a) lung adenocarcinoma.  The stage IVA designation was based upon her initially having a malignant pleural effusion.  She had no evidence of distant metastasis.  As testing showed her to harbor  the EGFR mutation, she has been taking osimertinib  since July 2022.  She comes in today to go over her chest CT and its implications.  Since her last visit, the patient has been doing well.  She denies having any new respiratory symptoms which concern her for disease recurrence.  However, she complains of intermittent headaches.    PHYSICAL EXAM:  Blood pressure (!) 128/90, pulse 82, temperature 97.9 F (36.6 C), temperature source Oral, resp. rate 16, height 5\' 4"  (1.626 m), weight 138 lb 8 oz (62.8 kg), SpO2 100%. Wt Readings from Last 3 Encounters:  12/24/23 141 lb 9.6 oz (64.2 kg)  09/30/23 138 lb 8 oz (62.8 kg)  08/16/23 138 lb 8 oz (62.8 kg)   Body mass index is 23.77 kg/m. Performance status (ECOG): 1 - Symptomatic but completely ambulatory Physical Exam Constitutional:      Appearance: Normal appearance. She is not ill-appearing.  HENT:     Mouth/Throat:     Mouth: Mucous membranes are moist.     Pharynx: Oropharynx is clear. No oropharyngeal exudate or posterior oropharyngeal erythema.  Cardiovascular:     Rate and Rhythm: Normal rate and regular rhythm.     Heart sounds: No murmur heard.    No friction rub. No gallop.  Pulmonary:     Effort: Pulmonary effort is normal. No respiratory distress.     Breath sounds: Normal breath sounds. No wheezing, rhonchi or rales.  Abdominal:     General: Bowel sounds are normal. There is no distension.     Palpations: Abdomen is soft. There is no mass.     Tenderness: There is no  abdominal tenderness.  Musculoskeletal:        General: No swelling.     Right lower leg: No edema.     Left lower leg: No edema.  Lymphadenopathy:     Cervical: No cervical adenopathy.     Upper Body:     Right upper body: No supraclavicular or axillary adenopathy.     Left upper body: No supraclavicular or axillary adenopathy.     Lower Body: No right inguinal adenopathy. No left inguinal adenopathy.  Skin:    General: Skin is warm.     Coloration: Skin is not jaundiced.     Findings: No lesion or rash.  Neurological:     General: No focal deficit present.     Mental Status: She is alert and oriented to person, place, and time. Mental status is at baseline.  Psychiatric:        Mood and Affect: Mood normal.        Behavior: Behavior normal.        Thought Content: Thought content normal.   SCANS:  Her chest CT done yesterday revealed the following: FINDINGS: Ill-defined consolidative change in the inferior right upper lobe is stable over multiple prior studies consistent with posttreatment changes. Mild pulmonary emphysema. No new nodule is seen. No pleural effusion. Normal heart size. No pericardial effusion. Normal caliber thoracic aorta. Normal thyroid  size. Metallic  density at the medial left pleura at the left base.  Calcified right hilar and subcarinal nodes consistent with old granulomatous disease. No pathologic adenopathy is seen.  Imaging through the upper abdomen is unremarkable. 16 mm sclerotic lesion of the L2 vertebral body, previously 12 mm in July. 7 mm sclerotic lesion of the T1 vertebral body. 6 mm sclerotic lesion of the T3 vertebral body. Both lesions are unchanged from September.  IMPRESSION: Small T1 and T3 vertebral body sclerotic lesions, unchanged from September. An incidentally imaged L2 vertebral body lesion measuring 16 mm has slightly increased in size from July. Stable posttreatment changes in the inferior right upper lobe. No evidence of disease  progression in the chest.  ASSESSMENT & PLAN:  Assessment/Plan:  A 47 y.o. 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   In clinic today, I went over her CT images with her, for which there is no obvious evidence of disease recurrence.  However, as she complains of headaches, I will order a brain MRI to ensure there is no evidence of CNS metastasis.  This study will be done within the next few days.  I will see her back in 1 week to go over her brain MRI images and their implications.  The patient understands all the plans discussed today and is in agreement with them.    Chevis Weisensel Felicia Horde, MD

## 2023-08-16 ENCOUNTER — Other Ambulatory Visit: Payer: Self-pay | Admitting: Oncology

## 2023-08-16 ENCOUNTER — Other Ambulatory Visit: Payer: Self-pay

## 2023-08-16 ENCOUNTER — Inpatient Hospital Stay: Payer: Medicaid Other | Attending: Oncology | Admitting: Oncology

## 2023-08-16 VITALS — BP 128/90 | HR 82 | Temp 97.9°F | Resp 16 | Ht 64.0 in | Wt 138.5 lb

## 2023-08-16 DIAGNOSIS — C3411 Malignant neoplasm of upper lobe, right bronchus or lung: Secondary | ICD-10-CM

## 2023-08-16 DIAGNOSIS — Z85118 Personal history of other malignant neoplasm of bronchus and lung: Secondary | ICD-10-CM | POA: Insufficient documentation

## 2023-08-16 DIAGNOSIS — R519 Headache, unspecified: Secondary | ICD-10-CM | POA: Diagnosis not present

## 2023-08-16 NOTE — Progress Notes (Signed)
Specialty Pharmacy Ongoing Clinical Assessment Note  Andrea Holloway is a 47 y.o. female who is being followed by the specialty pharmacy service for RxSp Oncology   Patient's specialty medication(s) reviewed today: Osimertinib Mesylate (TAGRISSO)   Missed doses in the last 4 weeks: 0   Patient/Caregiver did not have any additional questions or concerns.   Therapeutic benefit summary: Patient is achieving benefit   Adverse events/side effects summary: No adverse events/side effects   Patient's therapy is appropriate to: Continue    Goals Addressed             This Visit's Progress    Slow Disease Progression       Patient is on track. Patient will maintain adherence. Per today's provider notes patient recent scans showed no sign of persistent disease.          Follow up:  6 months  Otto Herb Specialty Pharmacist

## 2023-08-16 NOTE — Progress Notes (Signed)
Specialty Pharmacy Refill Coordination Note  Andrea Holloway is a 47 y.o. female contacted today regarding refills of specialty medication(s) Osimertinib Mesylate [name])   Patient requested Delivery   Delivery date: 08/23/23   Verified address: [address] [place] [address]   Medication will be filled on 08/22/23.

## 2023-08-21 ENCOUNTER — Telehealth: Payer: Self-pay | Admitting: Oncology

## 2023-08-21 NOTE — Telephone Encounter (Signed)
MRI Brain has been scheduled for 08/22/23 @ 3pm ; Check in at 2:45 pm at [facility].  LVM notifiying pt of date,time and instructions.

## 2023-08-22 ENCOUNTER — Ambulatory Visit (HOSPITAL_COMMUNITY): Admission: RE | Admit: 2023-08-22 | Payer: Medicaid Other | Source: Ambulatory Visit

## 2023-08-22 NOTE — Progress Notes (Deleted)
[facility]  [address] [place],  [place]  [address] [phone]  Clinic Day: 05/02/2023   Referring physician: Abner Greenspan, MD  HISTORY OF PRESENT ILLNESS:  The patient is a 47 y.o. female with stage IVA (T4 N2 M1a) lung adenocarcinoma.  The stage IVA designation was based upon her initially having a malignant pleural effusion.  She had no evidence of distant metastasis.  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  Recent scans showed her to have no obvious evidence of persistent disease.  She comes in off-schedule due to multiple complaints, including small knots on her right forearm, as well as morning nausea/vomiting.  She also complains of soft, but sluggish, stools.  She is scheduled to see GI in the forthcoming weeks to discuss having a colonoscopy done.    PHYSICAL EXAM:  There were no vitals taken for this visit. Wt Readings from Last 3 Encounters:  08/16/23 138 lb 8 oz (62.8 kg)  05/02/23 137 lb 8 oz (62.4 kg)  04/01/23 136 lb 14.4 oz (62.1 kg)   There is no height or weight on file to calculate BMI. Performance status (ECOG): 1 - Symptomatic but completely ambulatory Physical Exam Constitutional:      Appearance: Normal appearance. She is not ill-appearing.  HENT:     Mouth/Throat:     Mouth: Mucous membranes are moist.     Pharynx: Oropharynx is clear. No oropharyngeal exudate or posterior oropharyngeal erythema.  Cardiovascular:     Rate and Rhythm: Normal rate and regular rhythm.     Heart sounds: No murmur heard.    No friction rub. No gallop.  Pulmonary:     Effort: Pulmonary effort is normal. No respiratory distress.     Breath sounds: Normal breath sounds. No wheezing, rhonchi or rales.  Abdominal:     General: Bowel sounds are normal. There is no distension.     Palpations: Abdomen is soft. There is no mass.     Tenderness: There is no abdominal tenderness.   Musculoskeletal:        General: No swelling.     Right lower leg: No edema.     Left lower leg: No edema.  Lymphadenopathy:     Cervical: No cervical adenopathy.     Upper Body:     Right upper body: No supraclavicular or axillary adenopathy.     Left upper body: No supraclavicular or axillary adenopathy.     Lower Body: No right inguinal adenopathy. No left inguinal adenopathy.  Skin:    General: Skin is warm.     Coloration: Skin is not jaundiced.     Findings: No lesion or rash.  Neurological:     General: No focal deficit present.     Mental Status: She is alert and oriented to person, place, and time. Mental status is at baseline.  Psychiatric:        Mood and Affect: Mood normal.        Behavior: Behavior normal.        Thought Content: Thought content normal.   SCANS:  Her chest CT done yesterday revealed the following: FINDINGS: Ill-defined consolidative change in the inferior right upper lobe is stable over multiple prior studies consistent with posttreatment changes. Mild pulmonary emphysema. No new nodule is seen. No pleural effusion. Normal heart size. No pericardial effusion. Normal caliber thoracic aorta. Normal thyroid size. Metallic density at the medial  left pleura at the left base.  Calcified right hilar and subcarinal nodes consistent with old granulomatous disease. No pathologic adenopathy is seen.  Imaging through the upper abdomen is unremarkable. 16 mm sclerotic lesion of the L2 vertebral body, previously 12 mm in July. 7 mm sclerotic lesion of the T1 vertebral body. 6 mm sclerotic lesion of the T3 vertebral body. Both lesions are unchanged from September.  IMPRESSION: Small T1 and T3 vertebral body sclerotic lesions, unchanged from September. An incidentally imaged L2 vertebral body lesion measuring 16 mm has slightly increased in size from July. Stable posttreatment changes in the inferior right upper lobe. No evidence of disease progression in the  chest.  ASSESSMENT & PLAN:  Assessment/Plan:  A 47 y.o. 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   Overall, there was nothing per her physical exam nor history that particularly concerned me for disease recurrence.  As mentioned previously, recent CT scans showed no distant or locoregional disease. I reassured the patient that she is doing well and that I have no concern about disease recurrence at this time.  She knows to keep her future follow-up appointments with me, for which a repeat chest CT will be done a day before her next visit for her continued radiographic lung cancer surveillance.   The patient understands all the plans discussed today and is in agreement with them.    Labrina Lines Kirby Funk, MD

## 2023-08-23 ENCOUNTER — Inpatient Hospital Stay: Payer: Medicaid Other | Admitting: Oncology

## 2023-08-26 ENCOUNTER — Encounter: Payer: Self-pay | Admitting: Oncology

## 2023-09-03 ENCOUNTER — Ambulatory Visit (HOSPITAL_COMMUNITY): Admission: RE | Admit: 2023-09-03 | Payer: Medicaid Other | Source: Ambulatory Visit

## 2023-09-10 ENCOUNTER — Ambulatory Visit (HOSPITAL_COMMUNITY): Admission: RE | Admit: 2023-09-10 | Payer: Medicaid Other | Source: Ambulatory Visit

## 2023-09-12 ENCOUNTER — Ambulatory Visit (HOSPITAL_COMMUNITY): Payer: Medicaid Other

## 2023-09-12 ENCOUNTER — Encounter (HOSPITAL_COMMUNITY): Payer: Self-pay

## 2023-09-12 ENCOUNTER — Other Ambulatory Visit: Payer: Self-pay

## 2023-09-13 ENCOUNTER — Other Ambulatory Visit: Payer: Self-pay

## 2023-09-16 ENCOUNTER — Other Ambulatory Visit: Payer: Self-pay

## 2023-09-19 ENCOUNTER — Encounter: Payer: Self-pay | Admitting: Oncology

## 2023-09-23 ENCOUNTER — Other Ambulatory Visit (HOSPITAL_COMMUNITY): Payer: Self-pay

## 2023-09-26 ENCOUNTER — Other Ambulatory Visit: Payer: Self-pay | Admitting: Hematology and Oncology

## 2023-09-26 ENCOUNTER — Other Ambulatory Visit: Payer: Self-pay

## 2023-09-26 DIAGNOSIS — C3411 Malignant neoplasm of upper lobe, right bronchus or lung: Secondary | ICD-10-CM

## 2023-09-26 NOTE — Progress Notes (Signed)
Specialty Pharmacy Refill Coordination Note  Andrea Holloway is a 48 y.o. female contacted today regarding refills of specialty medication(s) No data recorded  Patient requested (Patient-Rptd) Delivery   Delivery date: 09/30/23   Verified address: (Patient-Rptd) [address] [place], [place] [address]   Medication will be filled on 09/27/23.   *Sent [employer]. Sent refill request and to contact MD's office since she has 4 tablets remaining. Also informed her that there are no deliveries made on weekends. Can have delievered on Monday 09/30/23 or for pick up at [facility] on Saturday 09/28/23.*

## 2023-09-27 ENCOUNTER — Other Ambulatory Visit: Payer: Self-pay

## 2023-09-27 NOTE — Progress Notes (Signed)
09/27/23 CMA: Tagrisso  Attempted to leave voicemail for patient to call Specialty Pharmacy. "Call could not be completed as dialed. Patient will need to call MD office for refill. No deliveries on the weekend. Can mail on 09/30/23 if we have new rx. Patient only has 4 tablets left as of 01/24.

## 2023-09-30 ENCOUNTER — Other Ambulatory Visit: Payer: Self-pay | Admitting: Oncology

## 2023-09-30 ENCOUNTER — Telehealth: Payer: Self-pay | Admitting: Oncology

## 2023-09-30 ENCOUNTER — Inpatient Hospital Stay: Payer: Medicaid Other | Attending: Oncology | Admitting: Oncology

## 2023-09-30 ENCOUNTER — Encounter: Payer: Self-pay | Admitting: Oncology

## 2023-09-30 ENCOUNTER — Inpatient Hospital Stay: Payer: Medicaid Other | Admitting: Oncology

## 2023-09-30 VITALS — BP 117/65 | HR 76 | Temp 98.4°F | Resp 18 | Ht 64.0 in | Wt 138.5 lb

## 2023-09-30 DIAGNOSIS — C3411 Malignant neoplasm of upper lobe, right bronchus or lung: Secondary | ICD-10-CM

## 2023-09-30 DIAGNOSIS — G959 Disease of spinal cord, unspecified: Secondary | ICD-10-CM | POA: Insufficient documentation

## 2023-09-30 DIAGNOSIS — R928 Other abnormal and inconclusive findings on diagnostic imaging of breast: Secondary | ICD-10-CM | POA: Insufficient documentation

## 2023-09-30 DIAGNOSIS — C3490 Malignant neoplasm of unspecified part of unspecified bronchus or lung: Secondary | ICD-10-CM | POA: Diagnosis present

## 2023-09-30 MED ORDER — OSIMERTINIB MESYLATE 80 MG PO TABS
80.0000 mg | ORAL_TABLET | Freq: Every day | ORAL | 3 refills | Status: DC
  Filled 2023-10-01: qty 30, 30d supply, fill #0
  Filled 2023-10-18: qty 30, 30d supply, fill #1
  Filled 2023-11-20: qty 30, 30d supply, fill #2
  Filled 2023-12-23: qty 30, 30d supply, fill #3

## 2023-09-30 NOTE — Telephone Encounter (Signed)
09/30/23 Next appt scheduled and confirmed with patient

## 2023-09-30 NOTE — Progress Notes (Signed)
[facility]  [address] [place],  [place]  [address] [phone]  Clinic Day: 09/30/2023   Referring physician: Abner Greenspan, MD  HISTORY OF PRESENT ILLNESS:  The patient is a 48 y.o. female with stage IVA (T4 N2 M1a) lung adenocarcinoma.  The stage IVA designation was based upon her initially having a malignant pleural effusion.  She had no evidence of distant metastasis.  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  Recent scans showed her to have no obvious evidence of active disease.  Of note, the patient was scheduled to undergo a brain MRI in late December 2024 for headaches to ensure she did not have any CNS metastasis.  However, due to transportation issues, this study was never done.  However, she claims her headaches have not been as problematic as they have been in the past.  She also denies having problems with her vision or balance.  However, she does complain of worsening mid back pain.  She is also concerned with a spot on her left breast.  Patient recently had a screening mammogram, which showed some form of asymmetry in her left breast to where a diagnostic mammogram with ultrasound was requested.  PHYSICAL EXAM:  Blood pressure 117/65, pulse 76, temperature 98.4 F (36.9 C), temperature source Oral, resp. rate 18, height 5\' 4"  (1.626 m), weight 138 lb 8 oz (62.8 kg), last menstrual period 12/04/2020, SpO2 100%. Wt Readings from Last 3 Encounters:  09/30/23 138 lb 8 oz (62.8 kg)  08/16/23 138 lb 8 oz (62.8 kg)  05/02/23 137 lb 8 oz (62.4 kg)   Body mass index is 23.77 kg/m. Performance status (ECOG): 1 - Symptomatic but completely ambulatory Physical Exam Constitutional:      Appearance: Normal appearance. She is not ill-appearing.  HENT:     Mouth/Throat:     Mouth: Mucous membranes are moist.     Pharynx: Oropharynx is clear. No oropharyngeal exudate or posterior  oropharyngeal erythema.  Cardiovascular:     Rate and Rhythm: Normal rate and regular rhythm.     Heart sounds: No murmur heard.    No friction rub. No gallop.  Pulmonary:     Effort: Pulmonary effort is normal. No respiratory distress.     Breath sounds: Normal breath sounds. No wheezing, rhonchi or rales.  Chest:  Breasts:    Right: No swelling, bleeding, inverted nipple, mass, nipple discharge or skin change.     Left: No swelling, bleeding, inverted nipple, mass, nipple discharge or skin change (a small, seemingly benign skin tag/lesion is 3-4 cm medial to her left breast).  Abdominal:     General: Bowel sounds are normal. There is no distension.     Palpations: Abdomen is soft. There is no mass.     Tenderness: There is no abdominal tenderness.  Musculoskeletal:        General: No swelling or tenderness.     Cervical back: Normal range of motion and neck supple.     Right lower leg: No edema.     Left lower leg: No edema.  Lymphadenopathy:     Cervical: No cervical adenopathy.     Right cervical: No superficial, deep or posterior cervical adenopathy.    Left cervical: No superficial, deep or posterior cervical adenopathy.     Upper Body:     Right upper body: No supraclavicular or axillary adenopathy.     Left upper  body: No supraclavicular or axillary adenopathy.     Lower Body: No right inguinal adenopathy. No left inguinal adenopathy.  Skin:    General: Skin is warm.     Coloration: Skin is not jaundiced.     Findings: No lesion or rash.  Neurological:     General: No focal deficit present.     Mental Status: She is alert and oriented to person, place, and time. Mental status is at baseline.  Psychiatric:        Mood and Affect: Mood normal.        Behavior: Behavior normal.        Thought Content: Thought content normal.        Judgment: Judgment normal.    ASSESSMENT & PLAN:  Assessment/Plan:  A 48 y.o. female with EGFR mutation positive stage IVA (T4 N2 M1a)  lung adenocarcinoma.   In clinic today, I reviewed her previous scans with her, which showed a sclerotic lesion in her thoracic spine.  Of note, this lesion was not hypermetabolic at this time of her initial diagnosis back in 2022.  However, I will order an MRI of her thoracic spine for further clarity.  If abnormal, I would consider biopsying the area in question.  If cancer is present, palliative radiation could be considered for localized management.  As mentioned previously, the patient recently had an abnormal screening mammogram.  I will arrange for her to undergo a diagnostic mammogram in the forthcoming weeks to ensure breast cancer is not present.  I anticipate seeing this patient back in 2-3 weeks to go over her both her thoracic spinal MRI and her diagnostic breast mammogram, as well as their implications.  The patient understands all the plans discussed today and is in agreement with them.    Telsa Dillavou Kirby Funk, MD

## 2023-10-01 ENCOUNTER — Other Ambulatory Visit (HOSPITAL_COMMUNITY): Payer: Self-pay

## 2023-10-01 ENCOUNTER — Other Ambulatory Visit: Payer: Self-pay

## 2023-10-03 ENCOUNTER — Other Ambulatory Visit: Payer: Self-pay

## 2023-10-14 ENCOUNTER — Inpatient Hospital Stay: Payer: Medicaid Other | Admitting: Oncology

## 2023-10-15 ENCOUNTER — Telehealth: Payer: Self-pay

## 2023-10-15 NOTE — Telephone Encounter (Signed)
I called pt to see how she is feeling today. She replied, "Actually, I feel better today". Her last low grade temp was yesterday. She didn't take her temp, but she states she sweated it out. She has intermittent non-productive,hacking cough, headache and her right eye has been matted in the mornings. She has been using tylenol and Dayquil OTC. She has been exposed to her 2 sons that have tested positive for the flu,(but don't live in her home). She is drinking well, but not much appetite. She is still taking her Tagrisso as ordered.

## 2023-10-17 ENCOUNTER — Telehealth: Payer: Self-pay

## 2023-10-17 NOTE — Telephone Encounter (Signed)
"  I have a weird sensation on the top of my head. It kind of feels like when you've worn your hair in a ponytail too tight. If this is something I should worry about, call me". I sent the above message to Dr Melvyn Neth and Cathey Endow.

## 2023-10-18 ENCOUNTER — Other Ambulatory Visit (HOSPITAL_COMMUNITY): Payer: Self-pay

## 2023-10-18 ENCOUNTER — Other Ambulatory Visit: Payer: Self-pay

## 2023-10-18 NOTE — Progress Notes (Signed)
Specialty Pharmacy Refill Coordination Note  Andrea Holloway is a 48 y.o. female contacted today regarding refills of specialty medication(s) Osimertinib Mesylate [name])   Patient requested Delivery   Delivery date: 10/29/23   Verified address: [address]   [place] [address]   Medication will be filled on 10/28/23.

## 2023-10-24 NOTE — Progress Notes (Incomplete)
[facility]  [address] [place],  [place]  [address] [phone]  Clinic Day: 09/30/2023   Referring physician: Abner Greenspan, MD  HISTORY OF PRESENT ILLNESS:  The patient is a 48 y.o. female with stage IVA (T4 N2 M1a) lung adenocarcinoma.  The stage IVA designation was based upon her initially having a malignant pleural effusion.  She had no evidence of distant metastasis.  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  Recent scans showed her to have no obvious evidence of active disease.  Of note, the patient was scheduled to undergo a brain MRI in late December 2024 for headaches to ensure she did not have any CNS metastasis.  However, due to transportation issues, this study was never done.  However, she claims her headaches have not been as problematic as they have been in the past.  She also denies having problems with her vision or balance.  However, she does complain of worsening mid back pain.  She is also concerned with a spot on her left breast.  Patient recently had a screening mammogram, which showed some form of asymmetry in her left breast to where a diagnostic mammogram with ultrasound was requested.  PHYSICAL EXAM:  Last menstrual period 12/04/2020. Wt Readings from Last 3 Encounters:  09/30/23 138 lb 8 oz (62.8 kg)  08/16/23 138 lb 8 oz (62.8 kg)  05/02/23 137 lb 8 oz (62.4 kg)   There is no height or weight on file to calculate BMI. Performance status (ECOG): 1 - Symptomatic but completely ambulatory Physical Exam Constitutional:      Appearance: Normal appearance. She is not ill-appearing.  HENT:     Mouth/Throat:     Mouth: Mucous membranes are moist.     Pharynx: Oropharynx is clear. No oropharyngeal exudate or posterior oropharyngeal erythema.  Cardiovascular:     Rate and Rhythm: Normal rate and regular rhythm.     Heart sounds: No murmur heard.    No friction rub. No  gallop.  Pulmonary:     Effort: Pulmonary effort is normal. No respiratory distress.     Breath sounds: Normal breath sounds. No wheezing, rhonchi or rales.  Chest:  Breasts:    Right: No swelling, bleeding, inverted nipple, mass, nipple discharge or skin change.     Left: No swelling, bleeding, inverted nipple, mass, nipple discharge or skin change (a small, seemingly benign skin tag/lesion is 3-4 cm medial to her left breast).  Abdominal:     General: Bowel sounds are normal. There is no distension.     Palpations: Abdomen is soft. There is no mass.     Tenderness: There is no abdominal tenderness.  Musculoskeletal:        General: No swelling or tenderness.     Cervical back: Normal range of motion and neck supple.     Right lower leg: No edema.     Left lower leg: No edema.  Lymphadenopathy:     Cervical: No cervical adenopathy.     Right cervical: No superficial, deep or posterior cervical adenopathy.    Left cervical: No superficial, deep or posterior cervical adenopathy.     Upper Body:     Right upper body: No supraclavicular or axillary adenopathy.     Left upper body: No supraclavicular or axillary adenopathy.     Lower Body: No right inguinal adenopathy. No left inguinal adenopathy.  Skin:  General: Skin is warm.     Coloration: Skin is not jaundiced.     Findings: No lesion or rash.  Neurological:     General: No focal deficit present.     Mental Status: She is alert and oriented to person, place, and time. Mental status is at baseline.  Psychiatric:        Mood and Affect: Mood normal.        Behavior: Behavior normal.        Thought Content: Thought content normal.        Judgment: Judgment normal.   ASSESSMENT & PLAN:  Assessment/Plan:  A 48 y.o. 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   In clinic today, I reviewed her previous scans with her, which showed a sclerotic lesion in her thoracic spine.  Of note, this lesion was  not hypermetabolic at this time of her initial diagnosis back in 2022.  However, I will order an MRI of her thoracic spine for further clarity.  If abnormal, I would consider biopsying the area in question.  If cancer is present, palliative radiation could be considered for localized management.  As mentioned previously, the patient recently had an abnormal screening mammogram.  I will arrange for her to undergo a diagnostic mammogram in the forthcoming weeks to ensure breast cancer is not present.  I anticipate seeing this patient back in 2-3 weeks to go over her both her thoracic spinal MRI and her diagnostic breast mammogram, as well as their implications.  The patient understands all the plans discussed today and is in agreement with them.    Mikalyn Hermida Kirby Funk, MD

## 2023-10-24 NOTE — Progress Notes (Deleted)
 [facility]  [address] [place],  [place]  [address] [phone]  Clinic Day: 09/30/2023   Referring physician: Abner Greenspan, MD  HISTORY OF PRESENT ILLNESS:  The patient is a 48 y.o. female with stage IVA (T4 N2 M1a) lung adenocarcinoma.  The stage IVA designation was based upon her initially having a malignant pleural effusion.  She had no evidence of distant metastasis.  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  Recent scans showed her to have no obvious evidence of active disease.  Of note, the patient was scheduled to undergo a brain MRI in late December 2024 for headaches to ensure she did not have any CNS metastasis.  However, due to transportation issues, this study was never done.  However, she claims her headaches have not been as problematic as they have been in the past.  She also denies having problems with her vision or balance.  However, she does complain of worsening mid back pain.  She is also concerned with a spot on her left breast.  Patient recently had a screening mammogram, which showed some form of asymmetry in her left breast to where a diagnostic mammogram with ultrasound was requested.  PHYSICAL EXAM:  Last menstrual period 12/04/2020. Wt Readings from Last 3 Encounters:  09/30/23 138 lb 8 oz (62.8 kg)  08/16/23 138 lb 8 oz (62.8 kg)  05/02/23 137 lb 8 oz (62.4 kg)   There is no height or weight on file to calculate BMI. Performance status (ECOG): 1 - Symptomatic but completely ambulatory Physical Exam Constitutional:      Appearance: Normal appearance. She is not ill-appearing.  HENT:     Mouth/Throat:     Mouth: Mucous membranes are moist.     Pharynx: Oropharynx is clear. No oropharyngeal exudate or posterior oropharyngeal erythema.  Cardiovascular:     Rate and Rhythm: Normal rate and regular rhythm.     Heart sounds: No murmur heard.    No friction rub. No  gallop.  Pulmonary:     Effort: Pulmonary effort is normal. No respiratory distress.     Breath sounds: Normal breath sounds. No wheezing, rhonchi or rales.  Chest:  Breasts:    Right: No swelling, bleeding, inverted nipple, mass, nipple discharge or skin change.     Left: No swelling, bleeding, inverted nipple, mass, nipple discharge or skin change (a small, seemingly benign skin tag/lesion is 3-4 cm medial to her left breast).  Abdominal:     General: Bowel sounds are normal. There is no distension.     Palpations: Abdomen is soft. There is no mass.     Tenderness: There is no abdominal tenderness.  Musculoskeletal:        General: No swelling or tenderness.     Cervical back: Normal range of motion and neck supple.     Right lower leg: No edema.     Left lower leg: No edema.  Lymphadenopathy:     Cervical: No cervical adenopathy.     Right cervical: No superficial, deep or posterior cervical adenopathy.    Left cervical: No superficial, deep or posterior cervical adenopathy.     Upper Body:     Right upper body: No supraclavicular or axillary adenopathy.     Left upper body: No supraclavicular or axillary adenopathy.     Lower Body: No right inguinal adenopathy. No left inguinal adenopathy.  Skin:  General: Skin is warm.     Coloration: Skin is not jaundiced.     Findings: No lesion or rash.  Neurological:     General: No focal deficit present.     Mental Status: She is alert and oriented to person, place, and time. Mental status is at baseline.  Psychiatric:        Mood and Affect: Mood normal.        Behavior: Behavior normal.        Thought Content: Thought content normal.        Judgment: Judgment normal.   ASSESSMENT & PLAN:  Assessment/Plan:  A 48 y.o. 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   In clinic today, I reviewed her previous scans with her, which showed a sclerotic lesion in her thoracic spine.  Of note, this lesion was  not hypermetabolic at this time of her initial diagnosis back in 2022.  However, I will order an MRI of her thoracic spine for further clarity.  If abnormal, I would consider biopsying the area in question.  If cancer is present, palliative radiation could be considered for localized management.  As mentioned previously, the patient recently had an abnormal screening mammogram.  I will arrange for her to undergo a diagnostic mammogram in the forthcoming weeks to ensure breast cancer is not present.  I anticipate seeing this patient back in 2-3 weeks to go over her both her thoracic spinal MRI and her diagnostic breast mammogram, as well as their implications.  The patient understands all the plans discussed today and is in agreement with them.    Mikalyn Hermida Kirby Funk, MD

## 2023-10-25 ENCOUNTER — Inpatient Hospital Stay: Payer: Medicaid Other | Admitting: Oncology

## 2023-11-06 ENCOUNTER — Encounter: Payer: Self-pay | Admitting: Cardiology

## 2023-11-06 ENCOUNTER — Ambulatory Visit: Admitting: Cardiology

## 2023-11-07 ENCOUNTER — Other Ambulatory Visit: Payer: Self-pay

## 2023-11-07 DIAGNOSIS — F40240 Claustrophobia: Secondary | ICD-10-CM

## 2023-11-07 MED ORDER — DIAZEPAM 5 MG PO TABS: 5.0000 mg | ORAL_TABLET | ORAL | 0 refills | Status: DC

## 2023-11-08 ENCOUNTER — Inpatient Hospital Stay: Payer: Medicaid Other | Attending: Oncology | Admitting: Oncology

## 2023-11-14 ENCOUNTER — Other Ambulatory Visit (HOSPITAL_COMMUNITY): Payer: Self-pay

## 2023-11-20 ENCOUNTER — Other Ambulatory Visit: Payer: Self-pay

## 2023-11-20 NOTE — Progress Notes (Signed)
 Specialty Pharmacy Refill Coordination Note  Lilas Diefendorf is a 48 y.o. female contacted today regarding refills of specialty medication(s) Osimertinib Mesylate [name])   Spoke with patient's mother  Patient requested Delivery   Delivery date: 11/27/23   Verified address: [address]   [place]  [address]   Medication will be filled on 03.25.25.

## 2023-11-26 ENCOUNTER — Other Ambulatory Visit: Payer: Self-pay

## 2023-12-09 ENCOUNTER — Encounter: Payer: Self-pay | Admitting: Cardiology

## 2023-12-09 NOTE — Progress Notes (Deleted)
 [facility]  [address] [place],  [place]  [address] [phone]  Clinic Day: 09/30/2023   Referring physician: Abner Greenspan, MD  HISTORY OF PRESENT ILLNESS:  The patient is a 48 y.o. female with stage IVA (T4 N2 M1a) lung adenocarcinoma.  The stage IVA designation was based upon her initially having a malignant pleural effusion.  She had no evidence of distant metastasis.  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  Recent scans showed her to have no obvious evidence of active disease.  Of note, the patient was scheduled to undergo a brain MRI in late December 2024 for headaches to ensure she did not have any CNS metastasis.  However, due to transportation issues, this study was never done.  However, she claims her headaches have not been as problematic as they have been in the past.  She also denies having problems with her vision or balance.  However, she does complain of worsening mid back pain.  She is also concerned with a spot on her left breast.  Patient recently had a screening mammogram, which showed some form of asymmetry in her left breast to where a diagnostic mammogram with ultrasound was requested.  PHYSICAL EXAM:  Last menstrual period 12/04/2020. Wt Readings from Last 3 Encounters:  09/30/23 138 lb 8 oz (62.8 kg)  08/16/23 138 lb 8 oz (62.8 kg)  05/02/23 137 lb 8 oz (62.4 kg)   There is no height or weight on file to calculate BMI. Performance status (ECOG): 1 - Symptomatic but completely ambulatory Physical Exam Constitutional:      Appearance: Normal appearance. She is not ill-appearing.  HENT:     Mouth/Throat:     Mouth: Mucous membranes are moist.     Pharynx: Oropharynx is clear. No oropharyngeal exudate or posterior oropharyngeal erythema.  Cardiovascular:     Rate and Rhythm: Normal rate and regular rhythm.     Heart sounds: No murmur heard.    No friction rub. No  gallop.  Pulmonary:     Effort: Pulmonary effort is normal. No respiratory distress.     Breath sounds: Normal breath sounds. No wheezing, rhonchi or rales.  Chest:  Breasts:    Right: No swelling, bleeding, inverted nipple, mass, nipple discharge or skin change.     Left: No swelling, bleeding, inverted nipple, mass, nipple discharge or skin change (a small, seemingly benign skin tag/lesion is 3-4 cm medial to her left breast).  Abdominal:     General: Bowel sounds are normal. There is no distension.     Palpations: Abdomen is soft. There is no mass.     Tenderness: There is no abdominal tenderness.  Musculoskeletal:        General: No swelling or tenderness.     Cervical back: Normal range of motion and neck supple.     Right lower leg: No edema.     Left lower leg: No edema.  Lymphadenopathy:     Cervical: No cervical adenopathy.     Right cervical: No superficial, deep or posterior cervical adenopathy.    Left cervical: No superficial, deep or posterior cervical adenopathy.     Upper Body:     Right upper body: No supraclavicular or axillary adenopathy.     Left upper body: No supraclavicular or axillary adenopathy.     Lower Body: No right inguinal adenopathy. No left inguinal adenopathy.  Skin:  General: Skin is warm.     Coloration: Skin is not jaundiced.     Findings: No lesion or rash.  Neurological:     General: No focal deficit present.     Mental Status: She is alert and oriented to person, place, and time. Mental status is at baseline.  Psychiatric:        Mood and Affect: Mood normal.        Behavior: Behavior normal.        Thought Content: Thought content normal.        Judgment: Judgment normal.   ASSESSMENT & PLAN:  Assessment/Plan:  A 48 y.o. 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   In clinic today, I reviewed her previous scans with her, which showed a sclerotic lesion in her thoracic spine.  Of note, this lesion was  not hypermetabolic at this time of her initial diagnosis back in 2022.  However, I will order an MRI of her thoracic spine for further clarity.  If abnormal, I would consider biopsying the area in question.  If cancer is present, palliative radiation could be considered for localized management.  As mentioned previously, the patient recently had an abnormal screening mammogram.  I will arrange for her to undergo a diagnostic mammogram in the forthcoming weeks to ensure breast cancer is not present.  I anticipate seeing this patient back in 2-3 weeks to go over her both her thoracic spinal MRI and her diagnostic breast mammogram, as well as their implications.  The patient understands all the plans discussed today and is in agreement with them.    Mikalyn Hermida Kirby Funk, MD

## 2023-12-10 ENCOUNTER — Inpatient Hospital Stay: Admitting: Oncology

## 2023-12-11 ENCOUNTER — Other Ambulatory Visit: Payer: Self-pay | Admitting: Oncology

## 2023-12-11 ENCOUNTER — Ambulatory Visit: Admitting: Cardiology

## 2023-12-11 DIAGNOSIS — C3411 Malignant neoplasm of upper lobe, right bronchus or lung: Secondary | ICD-10-CM

## 2023-12-13 ENCOUNTER — Telehealth: Payer: Self-pay | Admitting: Oncology

## 2023-12-13 NOTE — Telephone Encounter (Signed)
 Contacted pt to schedule an appt to review recent MRI studies. Unable to reach via phone, vm was left.

## 2023-12-20 ENCOUNTER — Encounter: Payer: Self-pay | Admitting: Oncology

## 2023-12-23 ENCOUNTER — Other Ambulatory Visit: Payer: Self-pay

## 2023-12-23 NOTE — Progress Notes (Signed)
 Specialty Pharmacy Refill Coordination Note  Kymberly Blomberg is a 48 y.o. female contacted today regarding refills of specialty medication(s) Osimertinib  Mesylate (TAGRISSO )   Patient requested Delivery   Delivery date: 12/30/23   Verified address: [address]   [place] [address]   Medication will be filled on 12/27/23.

## 2023-12-24 ENCOUNTER — Inpatient Hospital Stay: Attending: Oncology | Admitting: Oncology

## 2023-12-24 ENCOUNTER — Other Ambulatory Visit: Payer: Self-pay | Admitting: Oncology

## 2023-12-24 VITALS — BP 118/88 | HR 94 | Temp 98.4°F | Resp 16 | Ht 64.0 in | Wt 141.6 lb

## 2023-12-24 DIAGNOSIS — C3411 Malignant neoplasm of upper lobe, right bronchus or lung: Secondary | ICD-10-CM

## 2023-12-24 DIAGNOSIS — C3490 Malignant neoplasm of unspecified part of unspecified bronchus or lung: Secondary | ICD-10-CM | POA: Diagnosis present

## 2023-12-24 DIAGNOSIS — M546 Pain in thoracic spine: Secondary | ICD-10-CM | POA: Diagnosis not present

## 2023-12-24 DIAGNOSIS — M898X9 Other specified disorders of bone, unspecified site: Secondary | ICD-10-CM | POA: Diagnosis not present

## 2023-12-24 NOTE — Progress Notes (Unsigned)
 [facility]  [address] [place],  [place]  [address] [phone]  Clinic Day: 09/30/2023   Referring physician: Abner Greenspan, MD  HISTORY OF PRESENT ILLNESS:  The patient is a 48 y.o. female with stage IVA (T4 N2 M1a) lung adenocarcinoma.  The stage IVA designation was based upon her initially having a malignant pleural effusion.  She had no evidence of distant metastasis.  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  Recent scans showed her to have no obvious evidence of active disease.  Of note, the patient was scheduled to undergo a brain MRI in late December 2024 for headaches to ensure she did not have any CNS metastasis.  However, due to transportation issues, this study was never done.  However, she claims her headaches have not been as problematic as they have been in the past.  She also denies having problems with her vision or balance.  However, she does complain of worsening mid back pain.  She is also concerned with a spot on her left breast.  Patient recently had a screening mammogram, which showed some form of asymmetry in her left breast to where a diagnostic mammogram with ultrasound was requested.  PHYSICAL EXAM:  Last menstrual period 12/04/2020. Wt Readings from Last 3 Encounters:  09/30/23 138 lb 8 oz (62.8 kg)  08/16/23 138 lb 8 oz (62.8 kg)  05/02/23 137 lb 8 oz (62.4 kg)   There is no height or weight on file to calculate BMI. Performance status (ECOG): 1 - Symptomatic but completely ambulatory Physical Exam Constitutional:      Appearance: Normal appearance. She is not ill-appearing.  HENT:     Mouth/Throat:     Mouth: Mucous membranes are moist.     Pharynx: Oropharynx is clear. No oropharyngeal exudate or posterior oropharyngeal erythema.  Cardiovascular:     Rate and Rhythm: Normal rate and regular rhythm.     Heart sounds: No murmur heard.    No friction rub. No  gallop.  Pulmonary:     Effort: Pulmonary effort is normal. No respiratory distress.     Breath sounds: Normal breath sounds. No wheezing, rhonchi or rales.  Chest:  Breasts:    Right: No swelling, bleeding, inverted nipple, mass, nipple discharge or skin change.     Left: No swelling, bleeding, inverted nipple, mass, nipple discharge or skin change (a small, seemingly benign skin tag/lesion is 3-4 cm medial to her left breast).  Abdominal:     General: Bowel sounds are normal. There is no distension.     Palpations: Abdomen is soft. There is no mass.     Tenderness: There is no abdominal tenderness.  Musculoskeletal:        General: No swelling or tenderness.     Cervical back: Normal range of motion and neck supple.     Right lower leg: No edema.     Left lower leg: No edema.  Lymphadenopathy:     Cervical: No cervical adenopathy.     Right cervical: No superficial, deep or posterior cervical adenopathy.    Left cervical: No superficial, deep or posterior cervical adenopathy.     Upper Body:     Right upper body: No supraclavicular or axillary adenopathy.     Left upper body: No supraclavicular or axillary adenopathy.     Lower Body: No right inguinal adenopathy. No left inguinal adenopathy.  Skin:  General: Skin is warm.     Coloration: Skin is not jaundiced.     Findings: No lesion or rash.  Neurological:     General: No focal deficit present.     Mental Status: She is alert and oriented to person, place, and time. Mental status is at baseline.  Psychiatric:        Mood and Affect: Mood normal.        Behavior: Behavior normal.        Thought Content: Thought content normal.        Judgment: Judgment normal.   ASSESSMENT & PLAN:  Assessment/Plan:  A 48 y.o. 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   In clinic today, I reviewed her previous scans with her, which showed a sclerotic lesion in her thoracic spine.  Of note, this lesion was  not hypermetabolic at this time of her initial diagnosis back in 2022.  However, I will order an MRI of her thoracic spine for further clarity.  If abnormal, I would consider biopsying the area in question.  If cancer is present, palliative radiation could be considered for localized management.  As mentioned previously, the patient recently had an abnormal screening mammogram.  I will arrange for her to undergo a diagnostic mammogram in the forthcoming weeks to ensure breast cancer is not present.  I anticipate seeing this patient back in 2-3 weeks to go over her both her thoracic spinal MRI and her diagnostic breast mammogram, as well as their implications.  The patient understands all the plans discussed today and is in agreement with them.    Mikalyn Hermida Kirby Funk, MD

## 2023-12-27 ENCOUNTER — Other Ambulatory Visit: Payer: Self-pay

## 2024-01-01 NOTE — Addendum Note (Signed)
 Addended byShelbie Dess on: 01/01/2024 11:25 AM   Modules accepted: Orders

## 2024-01-08 NOTE — Addendum Note (Signed)
 Addended byShelbie Dess on: 01/08/2024 03:49 PM   Modules accepted: Orders

## 2024-01-09 ENCOUNTER — Other Ambulatory Visit: Payer: Self-pay | Admitting: Oncology

## 2024-01-09 ENCOUNTER — Telehealth: Payer: Self-pay

## 2024-01-09 MED ORDER — OXYCODONE HCL 10 MG PO TABS: ORAL_TABLET | ORAL | 0 refills | Status: DC

## 2024-01-09 NOTE — Telephone Encounter (Signed)
 Pt called in to report she is having unrelieved mid back pain. States it is about 3-4 in the morning and by evening it is a 7-8. She has used Aleve, tylenol  and goody powders with minimal relief. No loss of bowel & bladder, & no numbness in legs. She does have some hip pain as well. Pain described as "gets really tight and will bring me to my knees". I sent message to Dr Harles Lied.

## 2024-01-10 ENCOUNTER — Encounter (HOSPITAL_COMMUNITY): Payer: Self-pay

## 2024-01-13 ENCOUNTER — Other Ambulatory Visit: Payer: Self-pay | Admitting: Oncology

## 2024-01-13 ENCOUNTER — Inpatient Hospital Stay: Admitting: Oncology

## 2024-01-13 DIAGNOSIS — C3411 Malignant neoplasm of upper lobe, right bronchus or lung: Secondary | ICD-10-CM

## 2024-01-13 NOTE — Progress Notes (Signed)
 Roxie Cord, MD  Megha Agnes Approved for CT guided biopsy of enlarging sclerotic lesion in L2.  Challenging approach.  Hx lung cancer.  HKM       Previous Messages    ----- Message ----- From: Matteo Banke Sent: 01/13/2024   9:26 AM EDT To: Kalina Morabito; Ir Procedure Requests Subject: MR BIOPSY/WIRE LOCALIZATION                    Procedure : MR BIOPSY/WIRE LOCALIZATION  - may need to change order  Reason : 2 cm lower thoracic spinal lesion Dx: Malignant neoplasm of right upper lobe of lung (HCC) [C34.11 (ICD-10-CM)]  History : MR Thoracic spin w/ , MRI head w/wo , mammogram  - all images in PACS  Provider : Deloria Fetch, MD  Provider contact :  [phone]

## 2024-01-15 ENCOUNTER — Other Ambulatory Visit: Payer: Self-pay | Admitting: Oncology

## 2024-01-15 DIAGNOSIS — C3411 Malignant neoplasm of upper lobe, right bronchus or lung: Secondary | ICD-10-CM

## 2024-01-15 DIAGNOSIS — N6320 Unspecified lump in the left breast, unspecified quadrant: Secondary | ICD-10-CM

## 2024-01-17 ENCOUNTER — Encounter

## 2024-01-17 ENCOUNTER — Other Ambulatory Visit

## 2024-01-19 NOTE — Progress Notes (Deleted)
 [facility]  [address] [place],  [place]  [address] [phone]  Clinic Day: 12/24/2023   Referring physician: Lonie Roa, MD  HISTORY OF PRESENT ILLNESS:  The patient is a 48 y.o. female with stage IVA (T4 N2 M1a) lung adenocarcinoma.  The stage IVA designation was based upon her initially having a malignant pleural effusion.  She had no evidence of distant metastasis.  As testing showed her to harbor  the EGFR mutation, she has been taking osimertinib  since July 2022.  She comes in today to go over there MRI of her thoracic spine, which was done due to her having increased back pain.  This pain has persisted over these past weeks.  She denies losing control of her bladder or bowel function.  The pain essentially does not radiate.  On another note, the patient has yet to undergo her diagnostic mammogram.  She had a recent screening mammogram which showed asymmetry in her left breast to where a diagnostic mammogram with ultrasound was requested.  She admits she has not had this study done yet, with no substantial excuse as to why.    PHYSICAL EXAM:  Last menstrual period 12/04/2020. Wt Readings from Last 3 Encounters:  12/24/23 141 lb 9.6 oz (64.2 kg)  09/30/23 138 lb 8 oz (62.8 kg)  08/16/23 138 lb 8 oz (62.8 kg)   There is no height or weight on file to calculate BMI. Performance status (ECOG): 1 - Symptomatic but completely ambulatory Physical Exam Constitutional:      Appearance: Normal appearance. She is not ill-appearing.  HENT:     Mouth/Throat:     Mouth: Mucous membranes are moist.     Pharynx: Oropharynx is clear. No oropharyngeal exudate or posterior oropharyngeal erythema.  Cardiovascular:     Rate and Rhythm: Normal rate and regular rhythm.     Heart sounds: No murmur heard.    No friction rub. No gallop.  Pulmonary:     Effort: Pulmonary effort is normal. No respiratory distress.     Breath sounds: Normal breath sounds. No  wheezing, rhonchi or rales.  Chest:  Breasts:    Right: No swelling, bleeding, inverted nipple, mass, nipple discharge or skin change.     Left: No swelling, bleeding, inverted nipple, mass, nipple discharge or skin change (a small, seemingly benign skin tag/lesion is 3-4 cm medial to her left breast).  Abdominal:     General: Bowel sounds are normal. There is no distension.     Palpations: Abdomen is soft. There is no mass.     Tenderness: There is no abdominal tenderness.  Musculoskeletal:        General: No swelling or tenderness.     Cervical back: Normal range of motion and neck supple.     Right lower leg: No edema.     Left lower leg: No edema.  Lymphadenopathy:     Cervical: No cervical adenopathy.     Right cervical: No superficial, deep or posterior cervical adenopathy.    Left cervical: No superficial, deep or posterior cervical adenopathy.     Upper Body:     Right upper body: No supraclavicular or axillary adenopathy.     Left upper body: No supraclavicular or axillary adenopathy.     Lower Body: No right inguinal adenopathy. No left inguinal adenopathy.  Skin:    General: Skin is warm.     Coloration: Skin is not jaundiced.     Findings: No lesion or  rash.  Neurological:     General: No focal deficit present.     Mental Status: She is alert and oriented to person, place, and time. Mental status is at baseline.  Psychiatric:        Mood and Affect: Mood normal.        Behavior: Behavior normal.        Thought Content: Thought content normal.        Judgment: Judgment normal.  SCANS:   1) Her thoracic MRI revealed the following:   FINDINGS:  * ANATOMY: There are 12 rib-bearing thoracic segments.  * BONES: Mineralization is normal. No acute fractures. No new concerning masses. Sclerotic focus in the T2 vertebral body (15:7) is unchanged. The sclerotic T2 hypointense, T1 hypointense mass in the L2 vertebral body (15:7) is increased in size measuring 2.0 cm on today's  exam (previously 1.6 cm on 08/15/2023 and 1.2 cm on 03/20/2023). There is a lesion in the T1 vertebral body (15:6) which measures a proximally 8 mm) and is increased in size (previously 3 mm on 08/15/2023).  * SPINAL COLUMN: Alignment is normal. No evidence of dislocation. Disc spaces are preserved. No significant degenerative changes.  * SOFT TISSUES: Visualized lungs and mediastinum are normal. __________________ IMPRESSION:  Interval increase in size of the sclerotic lesions in the T1 and L2 vertebral bodies. These may represent metastatic disease. ---------------------------------------------------------------------------------------------- 2) Her brain MRI revealed the following: FINDINGS:  * ACUTE: NO ACUTE INFARCT OR HEMORRHAGE. NO MASS EFFECT OR HERNIATION.  * BRAIN PARENCHYMA: SIGNAL INTENSITIES ARE WITHIN NORMAL LIMITS FOR AGE. THERE ARE A FEW SMALL SCATTERED T2 HYPERINTENSE FOCI IN THE SUBCORTICAL WHITE MATTER WHICH ARE NONSPECIFIC AND MAY BE RELATED TO CHRONIC SMALL VESSEL DISEASE (E.G. HYPERTENSION, DIABETES, MIGRAINES), ENVIRONMENTAL EXPOSURE, OR PRIOR TRAUMA.  * VENTRICLES: NO HYDROCEPHALUS OR EXTRA-AXIAL FLUID COLLECTIONS.  * OTHER: VISUALIZED OSSEOUS STRUCTURES ARE NORMAL. BILATERAL MASTOID EFFUSIONS, SIMILAR TO PRIOR EXAMS. COMPLETE OPACIFICATION OF THE BILATERAL MAXILLARY SINUSES. __________________ IMPRESSION:  NO EVIDENCE OF METASTATIC DISEASE.  THERE ARE A FEW SMALL SCATTERED T2 HYPERINTENSE FOCI IN THE SUBCORTICAL WHITE MATTER WHICH ARE NONSPECIFIC AND MAY BE RELATED TO CHRONIC SMALL VESSEL DISEASE (E.G. HYPERTENSION, DIABETES, MIGRAINES), ENVIRONMENTAL EXPOSURE, OR PRIOR TRAUMA.  PARANASAL SINUS DISEASE.  ASSESSMENT & PLAN:  Assessment/Plan:  A 48 y.o. 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   In clinic today, I went over her thoracic MRI with her, for which she could see the lesions that are suspicious for metastatic  disease.  I will have interventional radiology biopsy her lumbar spinal lesion to prove if it represents metastatic disease, particularly as she complains of persistent back pain in this area.  On another note, I do want the patient to get her diagnostic mammogram to ensure her left breast abnormality does not represent cancer.  I will see her back in 3 weeks to go over both her biopsy and mammogram results, as well as their implications.  The patient understands all the plans discussed today and is in agreement with them.    Tavious Griesinger Felicia Horde, MD

## 2024-01-20 ENCOUNTER — Other Ambulatory Visit: Payer: Self-pay

## 2024-01-20 ENCOUNTER — Inpatient Hospital Stay: Admitting: Oncology

## 2024-01-22 ENCOUNTER — Other Ambulatory Visit: Payer: Self-pay

## 2024-01-24 ENCOUNTER — Other Ambulatory Visit (HOSPITAL_COMMUNITY): Payer: Self-pay

## 2024-01-24 ENCOUNTER — Other Ambulatory Visit: Payer: Self-pay | Admitting: Oncology

## 2024-01-24 ENCOUNTER — Other Ambulatory Visit (HOSPITAL_COMMUNITY): Payer: Self-pay | Admitting: Pharmacy Technician

## 2024-01-24 NOTE — Progress Notes (Signed)
 Specialty Pharmacy Refill Coordination Note  Andrea Holloway is a 48 y.o. female contacted today regarding refills of specialty medication(s) Osimertinib  Mesylate (TAGRISSO )   Patient requested Delivery   Delivery date: 01/29/24   Verified address: [address] [place], [place] [address]   Medication will be filled on 01/28/24.

## 2024-01-28 ENCOUNTER — Other Ambulatory Visit: Payer: Self-pay

## 2024-01-31 ENCOUNTER — Other Ambulatory Visit: Payer: Self-pay

## 2024-01-31 ENCOUNTER — Other Ambulatory Visit: Payer: Self-pay | Admitting: Radiology

## 2024-01-31 DIAGNOSIS — C3411 Malignant neoplasm of upper lobe, right bronchus or lung: Secondary | ICD-10-CM

## 2024-01-31 MED ORDER — OSIMERTINIB MESYLATE 80 MG PO TABS
80.0000 mg | ORAL_TABLET | Freq: Every day | ORAL | 3 refills | Status: DC
Start: 2024-01-31 — End: 2024-05-08
  Filled 2024-01-31: qty 30, 30d supply, fill #0
  Filled 2024-02-24: qty 30, 30d supply, fill #1
  Filled 2024-03-20: qty 30, 30d supply, fill #2
  Filled 2024-04-17 – 2024-05-08 (×2): qty 30, 30d supply, fill #3

## 2024-01-31 NOTE — Progress Notes (Signed)
 Clinical Intervention Note  Clinical Intervention Notes: Provider did not send refill prescription in time and shipment was delayed. Patient only has 1 tablet left for today. Shipment is being sent next day air but anticipated to arrive Monday. Patient will likely miss her doses on Saturday and Sunday due to this. Spoke to patient and she states that she does not miss doses regularly. Advised that it will not be detrimental to her treatment to miss only 2 doses over the weekend and that she will restart once medication is received. Patient is in agreement.   Clinical Intervention Outcomes: Improved therapy adherence   Rena Carnes Specialty Pharmacist

## 2024-02-02 NOTE — H&P (Shared)
 Chief Complaint: Patient was seen in consultation today for L2 vertebra sclerotic lesion in the setting of RUL lung cancer, with consideration for biopsy.  Referring Provider(s): Dr. Ciro Cress, MD   Supervising Physician: Marland Silvas  Patient Status: [facility] - Out-pt  Patient is Full Code  History of Present Illness: Andrea Holloway is a 48 y.o. female  with PMHx notable for lung cancer, cardiac murmurs, palpitations, tobacco use, anxiety, and depression.  Per Dr. Harles Lied' progress note on 4/22: "A 48 y.o. 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   In clinic today, I went over her thoracic MRI with her, for which she could see the lesions that are suspicious for metastatic disease.  I will have interventional radiology biopsy her lumbar spinal lesion to prove if it represents metastatic disease, particularly as she complains of persistent back pain in this area.  [...] I will see her back in 3 weeks to go over both her biopsy and mammogram results, as well as their implications.  The patient understands all the plans discussed today and is in agreement with them."   Interventional Radiology was requested for L2 vertebra sclerotic lesion biopsy. Request was reviewed and approved by Dr. Marne Sings. Patient is scheduled for same in IR today.   ***Patient is alert and laying in bed, calm.  Patient is currently without any significant complaints.  Patient denies any fevers, headache, chest pain, SOB, cough, abdominal pain, nausea, vomiting or bleeding.     Past Medical History:  Diagnosis Date   Acne vulgaris 04/03/2022   Adenocarcinoma of lung, stage 4 (HCC) 01/10/2022   Anxiety    Cardiac murmur 05/01/2022   Chest pain    Current smoker 01/10/2022   Depression    Lung cancer (HCC)    Malignant neoplasm of upper lobe of right lung (HCC) 02/03/2021   Palpitations    Seizures (HCC)     Past Surgical History:  Procedure Laterality  Date   CARDIAC SURGERY     SAC OF FLUID BEHIND HEART @ 48 YEARS OF AGE   MYRINGOTOMY WITH TUBE PLACEMENT Bilateral    TUBAL LIGATION      Allergies: Iodine  Medications: Prior to Admission medications   Medication Sig Start Date End Date Taking? Authorizing Provider  diazepam  (VALIUM ) 5 MG tablet Take 1 tablet (5 mg total) by mouth as directed for 1 dose. Take 30 minutes before MRI. 11/07/23   Ciro Cress A, MD  osimertinib  mesylate (TAGRISSO ) 80 MG tablet Take 1 tablet (80 mg total) by mouth daily. 01/31/24   Adelaide Adjutant, NP  Oxycodone  HCl 10 MG TABS One tab po q4-6hr prn pain 01/09/24   Deloria Fetch, MD     Family History  Problem Relation Age of Onset   Hodgkin's lymphoma Sister 23   Thyroid  cancer Maternal Grandmother 54   Prostate cancer Maternal Grandfather 80   Penile cancer Maternal Grandfather 37   Breast cancer Maternal Aunt 40   Colon cancer Cousin        dx unknown age   Colon cancer Cousin        dx unknown age   Colon cancer Cousin        dx unknown age   Lung cancer Cousin 60   Brain cancer Cousin     Social History   Socioeconomic History   Marital status: Divorced    Spouse name: Not on file   Number of children:  3   Years of education: 9   Highest education level: Not on file  Occupational History   Occupation: UNEMPLOYED  Tobacco Use   Smoking status: Every Day    Types: Cigarettes   Smokeless tobacco: Never  Substance and Sexual Activity   Alcohol use: Not Currently    Comment: OCCASIONAL   Drug use: Yes    Types: Marijuana   Sexual activity: Not Currently  Other Topics Concern   Not on file  Social History Narrative   Not on file   Social Drivers of Health   Financial Resource Strain: Not on file  Food Insecurity: Low Risk  (02/08/2023)   [facility]   Hunger Vital Sign    Worried About Running Out of Food in the Last Year: Never true    Ran Out of Food in the Last Year: Never true   Transportation Needs: Not on file (02/08/2023)  Physical Activity: Not on file  Stress: Not on file  Social Connections: Not on file     Review of Systems: A 12 point ROS discussed and pertinent positives are indicated in the HPI above.  All other systems are negative.  Vital Signs: LMP 12/04/2020   Advance Care Plan: The advanced care place/surrogate decision maker was discussed at the time of visit and the patient did not wish to discuss or was not able to name a surrogate decision maker or provide an advance care plan.  Physical Exam  Imaging: No results found.  Labs:  CBC: Recent Labs    03/20/23 0000  WBC 5.7  HGB 14.0  HCT 40  PLT 145*    COAGS: No results for input(s): "INR", "APTT" in the last 8760 hours.  BMP: Recent Labs    03/20/23 0000  NA 137  K 3.7  CL 103  CO2 23*  BUN 9  CALCIUM 9.2  CREATININE 0.8    LIVER FUNCTION TESTS: Recent Labs    03/20/23 0000  AST 27  ALT 13  ALKPHOS 66  ALBUMIN 4.5    TUMOR MARKERS: No results for input(s): "AFPTM", "CEA", "CA199", "CHROMGRNA" in the last 8760 hours.  Assessment and Plan: Per Dr. Harles Lied' progress note on 4/22: "A 48 y.o. 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   In clinic today, I went over her thoracic MRI with her, for which she could see the lesions that are suspicious for metastatic disease.  I will have interventional radiology biopsy her lumbar spinal lesion to prove if it represents metastatic disease, particularly as she complains of persistent back pain in this area.  [...] I will see her back in 3 weeks to go over both her biopsy and mammogram results, as well as their implications.  The patient understands all the plans discussed today and is in agreement with them."  Patient presents for scheduled L2 vertebra lesion biopsy in IR today.  Patient has been NPO since midnight.  All labs and medications are within acceptable parameters.  Patient  has a topical iodine allergy.   Risks and benefits of L2 veterbra lesion biopsy was discussed with the patient and/or patient's family including, but not limited to bleeding, infection, damage to adjacent structures or low yield requiring additional tests.  All of the questions were answered and there is agreement to proceed.  Consent signed and in chart.    Thank you for allowing our service to participate in Kinda Pottle 's care.  Electronically  Signed: Lovena Rubinstein, PA-C   02/02/2024, 11:21 PM      I spent a total of 30 Minutes  in face to face in clinical consultation, greater than 50% of which was counseling/coordinating care for L2 vertebra sclerotic lesion in the setting of RUL lung cancer, with consideration for biopsy.

## 2024-02-03 ENCOUNTER — Ambulatory Visit (HOSPITAL_COMMUNITY)
Admission: RE | Admit: 2024-02-03 | Discharge: 2024-02-03 | Disposition: A | Discharge status: Home / Self Care | Source: Ambulatory Visit | Attending: Oncology | Admitting: Oncology

## 2024-02-11 ENCOUNTER — Other Ambulatory Visit: Payer: Self-pay

## 2024-02-12 ENCOUNTER — Other Ambulatory Visit: Payer: Self-pay

## 2024-02-12 NOTE — Progress Notes (Signed)
 Specialty Pharmacy Ongoing Clinical Assessment Note  Andrea Holloway is a 48 y.o. female who is being followed by the specialty pharmacy service for RxSp Oncology   Patient's specialty medication(s) reviewed today: Osimertinib  Mesylate (TAGRISSO )   Missed doses in the last 4 weeks: 3   Patient/Caregiver did not have any additional questions or concerns.   Therapeutic benefit summary: Patient is NOT achieving benefit   Adverse events/side effects summary: Unable to assess (Patient did not want to answer.)   Patient's therapy is appropriate to: Continue    Goals Addressed             This Visit's Progress    Slow Disease Progression   Worsening    Patient is not on track and worsening. Patient will maintain adherence and be monitored by provider to determine if a change in treatment plan is warranted. Per last visit on 12/24/23, scans showed lesions that were suspicious for metastatic disease on thoracic MRI. Dr. Harles Lied also concerned about a breast abnormality and waiting for patient to get mammogram to ensure it is not cancer.          Follow up: 6 months  [facility]

## 2024-02-14 ENCOUNTER — Ambulatory Visit
Admission: RE | Admit: 2024-02-14 | Discharge: 2024-02-14 | Disposition: A | Discharge status: Home / Self Care | Source: Ambulatory Visit | Attending: Oncology | Admitting: Oncology

## 2024-02-14 DIAGNOSIS — N6320 Unspecified lump in the left breast, unspecified quadrant: Secondary | ICD-10-CM

## 2024-02-14 DIAGNOSIS — C3411 Malignant neoplasm of upper lobe, right bronchus or lung: Secondary | ICD-10-CM

## 2024-02-17 ENCOUNTER — Other Ambulatory Visit: Payer: Self-pay | Admitting: Oncology

## 2024-02-17 DIAGNOSIS — N6320 Unspecified lump in the left breast, unspecified quadrant: Secondary | ICD-10-CM

## 2024-02-19 ENCOUNTER — Telehealth: Payer: Self-pay | Admitting: Oncology

## 2024-02-19 NOTE — Telephone Encounter (Signed)
 Patient has been scheduled for an appt to establish care with Dr. Aurther Blue. Pt is aware of appt date and time.

## 2024-02-21 ENCOUNTER — Other Ambulatory Visit: Payer: Self-pay

## 2024-02-24 ENCOUNTER — Other Ambulatory Visit: Payer: Self-pay

## 2024-02-24 NOTE — Progress Notes (Signed)
 Specialty Pharmacy Refill Coordination Note  Andrea Holloway is a 48 y.o. female contacted today regarding refills of specialty medication(s) Osimertinib  Mesylate (TAGRISSO )   Patient requested Delivery   Delivery date: 02/28/24   Verified address: [address] [place] [address]   Medication will be filled on 02/27/24.

## 2024-02-27 ENCOUNTER — Other Ambulatory Visit: Payer: Self-pay | Admitting: Oncology

## 2024-02-27 ENCOUNTER — Other Ambulatory Visit: Payer: Self-pay

## 2024-02-27 DIAGNOSIS — C3411 Malignant neoplasm of upper lobe, right bronchus or lung: Secondary | ICD-10-CM

## 2024-02-27 NOTE — Progress Notes (Incomplete)
 [facility]  [address] [place],  [place]  [address] [phone]  Clinic Day: 12/24/2023   Referring physician: Lonie Roa, MD  HISTORY OF PRESENT ILLNESS:  The patient is a 48 y.o. female with stage IVA (T4 N2 M1a) lung adenocarcinoma.  The stage IVA designation was based upon her initially having a malignant pleural effusion.  She had no evidence of distant metastasis.  As testing showed her to harbor  the EGFR mutation, she has been taking osimertinib  since July 2022.  She comes in today to go over there MRI of her thoracic spine, which was done due to her having increased back pain.  This pain has persisted over these past weeks.  She denies losing control of her bladder or bowel function.  The pain essentially does not radiate.  On another note, the patient has yet to undergo her diagnostic mammogram.  She had a recent screening mammogram which showed asymmetry in her left breast to where a diagnostic mammogram with ultrasound was requested.  She admits she has not had this study done yet, with no substantial excuse as to why.    PHYSICAL EXAM:  Last menstrual period 12/04/2020. Wt Readings from Last 3 Encounters:  12/24/23 141 lb 9.6 oz (64.2 kg)  09/30/23 138 lb 8 oz (62.8 kg)  08/16/23 138 lb 8 oz (62.8 kg)   There is no height or weight on file to calculate BMI. Performance status (ECOG): 1 - Symptomatic but completely ambulatory Physical Exam Constitutional:      Appearance: Normal appearance. She is not ill-appearing.  HENT:     Mouth/Throat:     Mouth: Mucous membranes are moist.     Pharynx: Oropharynx is clear. No oropharyngeal exudate or posterior oropharyngeal erythema.  Cardiovascular:     Rate and Rhythm: Normal rate and regular rhythm.     Heart sounds: No murmur heard.    No friction rub. No gallop.  Pulmonary:     Effort: Pulmonary effort is normal. No respiratory distress.     Breath sounds: Normal breath sounds. No  wheezing, rhonchi or rales.  Chest:  Breasts:    Right: No swelling, bleeding, inverted nipple, mass, nipple discharge or skin change.     Left: No swelling, bleeding, inverted nipple, mass, nipple discharge or skin change (a small, seemingly benign skin tag/lesion is 3-4 cm medial to her left breast).  Abdominal:     General: Bowel sounds are normal. There is no distension.     Palpations: Abdomen is soft. There is no mass.     Tenderness: There is no abdominal tenderness.  Musculoskeletal:        General: No swelling or tenderness.     Cervical back: Normal range of motion and neck supple.     Right lower leg: No edema.     Left lower leg: No edema.  Lymphadenopathy:     Cervical: No cervical adenopathy.     Right cervical: No superficial, deep or posterior cervical adenopathy.    Left cervical: No superficial, deep or posterior cervical adenopathy.     Upper Body:     Right upper body: No supraclavicular or axillary adenopathy.     Left upper body: No supraclavicular or axillary adenopathy.     Lower Body: No right inguinal adenopathy. No left inguinal adenopathy.  Skin:    General: Skin is warm.     Coloration: Skin is not jaundiced.     Findings: No lesion or  rash.  Neurological:     General: No focal deficit present.     Mental Status: She is alert and oriented to person, place, and time. Mental status is at baseline.  Psychiatric:        Mood and Affect: Mood normal.        Behavior: Behavior normal.        Thought Content: Thought content normal.        Judgment: Judgment normal.  SCANS:   1) Her thoracic MRI revealed the following:   FINDINGS:  * ANATOMY: There are 12 rib-bearing thoracic segments.  * BONES: Mineralization is normal. No acute fractures. No new concerning masses. Sclerotic focus in the T2 vertebral body (15:7) is unchanged. The sclerotic T2 hypointense, T1 hypointense mass in the L2 vertebral body (15:7) is increased in size measuring 2.0 cm on today's  exam (previously 1.6 cm on 08/15/2023 and 1.2 cm on 03/20/2023). There is a lesion in the T1 vertebral body (15:6) which measures a proximally 8 mm) and is increased in size (previously 3 mm on 08/15/2023).  * SPINAL COLUMN: Alignment is normal. No evidence of dislocation. Disc spaces are preserved. No significant degenerative changes.  * SOFT TISSUES: Visualized lungs and mediastinum are normal. __________________ IMPRESSION:  Interval increase in size of the sclerotic lesions in the T1 and L2 vertebral bodies. These may represent metastatic disease. ---------------------------------------------------------------------------------------------- 2) Her brain MRI revealed the following: FINDINGS:  * ACUTE: NO ACUTE INFARCT OR HEMORRHAGE. NO MASS EFFECT OR HERNIATION.  * BRAIN PARENCHYMA: SIGNAL INTENSITIES ARE WITHIN NORMAL LIMITS FOR AGE. THERE ARE A FEW SMALL SCATTERED T2 HYPERINTENSE FOCI IN THE SUBCORTICAL WHITE MATTER WHICH ARE NONSPECIFIC AND MAY BE RELATED TO CHRONIC SMALL VESSEL DISEASE (E.G. HYPERTENSION, DIABETES, MIGRAINES), ENVIRONMENTAL EXPOSURE, OR PRIOR TRAUMA.  * VENTRICLES: NO HYDROCEPHALUS OR EXTRA-AXIAL FLUID COLLECTIONS.  * OTHER: VISUALIZED OSSEOUS STRUCTURES ARE NORMAL. BILATERAL MASTOID EFFUSIONS, SIMILAR TO PRIOR EXAMS. COMPLETE OPACIFICATION OF THE BILATERAL MAXILLARY SINUSES. __________________ IMPRESSION:  NO EVIDENCE OF METASTATIC DISEASE.  THERE ARE A FEW SMALL SCATTERED T2 HYPERINTENSE FOCI IN THE SUBCORTICAL WHITE MATTER WHICH ARE NONSPECIFIC AND MAY BE RELATED TO CHRONIC SMALL VESSEL DISEASE (E.G. HYPERTENSION, DIABETES, MIGRAINES), ENVIRONMENTAL EXPOSURE, OR PRIOR TRAUMA.  PARANASAL SINUS DISEASE.  ASSESSMENT & PLAN:  Assessment/Plan:  A 48 y.o. 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   In clinic today, I went over her thoracic MRI with her, for which she could see the lesions that are suspicious for metastatic  disease.  I will have interventional radiology biopsy her lumbar spinal lesion to prove if it represents metastatic disease, particularly as she complains of persistent back pain in this area.  On another note, I do want the patient to get her diagnostic mammogram to ensure her left breast abnormality does not represent cancer.  I will see her back in 3 weeks to go over both her biopsy and mammogram results, as well as their implications.  The patient understands all the plans discussed today and is in agreement with them.    Tavious Griesinger Felicia Horde, MD

## 2024-02-28 ENCOUNTER — Other Ambulatory Visit: Payer: Self-pay | Admitting: Oncology

## 2024-02-28 ENCOUNTER — Inpatient Hospital Stay: Attending: Oncology | Admitting: Oncology

## 2024-02-28 ENCOUNTER — Inpatient Hospital Stay

## 2024-02-28 ENCOUNTER — Telehealth: Payer: Self-pay | Admitting: Oncology

## 2024-02-28 ENCOUNTER — Encounter: Payer: Self-pay | Admitting: Oncology

## 2024-02-28 VITALS — BP 152/89 | HR 80 | Temp 97.9°F | Resp 18 | Ht 64.0 in | Wt 140.4 lb

## 2024-02-28 DIAGNOSIS — K121 Other forms of stomatitis: Secondary | ICD-10-CM

## 2024-02-28 DIAGNOSIS — N6321 Unspecified lump in the left breast, upper outer quadrant: Secondary | ICD-10-CM

## 2024-02-28 DIAGNOSIS — Z808 Family history of malignant neoplasm of other organs or systems: Secondary | ICD-10-CM | POA: Diagnosis not present

## 2024-02-28 DIAGNOSIS — F17210 Nicotine dependence, cigarettes, uncomplicated: Secondary | ICD-10-CM | POA: Insufficient documentation

## 2024-02-28 DIAGNOSIS — Z801 Family history of malignant neoplasm of trachea, bronchus and lung: Secondary | ICD-10-CM | POA: Diagnosis not present

## 2024-02-28 DIAGNOSIS — C7951 Secondary malignant neoplasm of bone: Secondary | ICD-10-CM | POA: Insufficient documentation

## 2024-02-28 DIAGNOSIS — C3411 Malignant neoplasm of upper lobe, right bronchus or lung: Secondary | ICD-10-CM | POA: Insufficient documentation

## 2024-02-28 DIAGNOSIS — Z807 Family history of other malignant neoplasms of lymphoid, hematopoietic and related tissues: Secondary | ICD-10-CM | POA: Insufficient documentation

## 2024-02-28 DIAGNOSIS — Z9221 Personal history of antineoplastic chemotherapy: Secondary | ICD-10-CM | POA: Diagnosis not present

## 2024-02-28 DIAGNOSIS — Z803 Family history of malignant neoplasm of breast: Secondary | ICD-10-CM | POA: Insufficient documentation

## 2024-02-28 DIAGNOSIS — Z800 Family history of malignant neoplasm of digestive organs: Secondary | ICD-10-CM | POA: Diagnosis not present

## 2024-02-28 DIAGNOSIS — M792 Neuralgia and neuritis, unspecified: Secondary | ICD-10-CM | POA: Insufficient documentation

## 2024-02-28 DIAGNOSIS — Z8042 Family history of malignant neoplasm of prostate: Secondary | ICD-10-CM | POA: Diagnosis not present

## 2024-02-28 LAB — CMP (CANCER CENTER ONLY)
ALT: 8 U/L (ref 0–44)
AST: 20 U/L (ref 15–41)
Albumin: 4.8 g/dL (ref 3.5–5.0)
Alkaline Phosphatase: 89 U/L (ref 38–126)
Anion gap: 11 (ref 5–15)
BUN: 9 mg/dL (ref 6–20)
CO2: 25 mmol/L (ref 22–32)
Calcium: 9.8 mg/dL (ref 8.9–10.3)
Chloride: 103 mmol/L (ref 98–111)
Creatinine: 0.86 mg/dL (ref 0.44–1.00)
GFR, Estimated: >60 mL/min (ref >60)
Glucose, Bld: 97 mg/dL (ref 70–99)
Potassium: 3.9 mmol/L (ref 3.5–5.1)
Sodium: 138 mmol/L (ref 135–145)
Total Bilirubin: 0.5 mg/dL (ref 0.0–1.2)
Total Protein: 7.2 g/dL (ref 6.5–8.1)

## 2024-02-28 LAB — CBC WITH DIFFERENTIAL (CANCER CENTER ONLY)
Abs Immature Granulocytes: 0.01 10*3/uL (ref 0.00–0.07)
Basophils Absolute: 0 10*3/uL (ref 0.0–0.1)
Basophils Relative: 0 %
Eosinophils Absolute: 0 10*3/uL (ref 0.0–0.5)
Eosinophils Relative: 0 %
HCT: 42.5 % (ref 36.0–46.0)
Hemoglobin: 14.5 g/dL (ref 12.0–15.0)
Immature Granulocytes: 0 %
Lymphocytes Relative: 30 %
Lymphs Abs: 1.6 10*3/uL (ref 0.7–4.0)
MCH: 31.5 pg (ref 26.0–34.0)
MCHC: 34.1 g/dL (ref 30.0–36.0)
MCV: 92.4 fL (ref 80.0–100.0)
Monocytes Absolute: 0.4 10*3/uL (ref 0.1–1.0)
Monocytes Relative: 7 %
Neutro Abs: 3.4 10*3/uL (ref 1.7–7.7)
Neutrophils Relative %: 63 %
Platelet Count: 159 10*3/uL (ref 150–400)
RBC: 4.6 MIL/uL (ref 3.87–5.11)
RDW: 12.5 % (ref 11.5–15.5)
WBC Count: 5.4 10*3/uL (ref 4.0–10.5)
nRBC: 0 % (ref 0.0–0.2)

## 2024-02-28 LAB — CEA (ACCESS): CEA (CHCC): 2.82 ng/mL (ref 0.00–5.00)

## 2024-02-28 MED ORDER — GABAPENTIN 300 MG PO CAPS: 300.0000 mg | ORAL_CAPSULE | Freq: Every day | ORAL | 5 refills | Status: DC

## 2024-02-28 NOTE — Progress Notes (Signed)
 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 These are consistent with metastases but she has not had scans in 6 months and I think we need to reassess. A PET scan would be helpful to see if these lesions would light up and look for the status of her lung primary and assess any other metastatic disease. She declines a spine biopsy. She is having escalating pain and ridiculer neuropathy so we will start her on Gabapentin 300mg  HS. Normally I would also recommend Zometa or Xgeva but we need to evaluate her dental issues first.   Bone Metastases I really don't have much doubt that this is metastatic disease based on appearance and increase in pain so we will not pursue a biopsy at this time. A PET scan may answer the question.  Possible Mass in the Upper Outer Quadrant of the Posterior Left Breast This measures 1.5cm. this is partially obscured and indistinct and her breasts are heterogeneously dense. Examination is more consistent with fibrocystic changes and she refuses a biopsy at this time. We will plan to repeat a ultrasound in 6 months.   Dental Issues The stomatitis is probably related to the Tagrisso  but her receeding gums and teeth falling out is not usually seen with this. She needs evaluation by a dentist or oral surgeon.   Tobacco Abuse She has cut back but continues to smoke 1 half pack per day.   Plan: Patient informed me that she currently does not have an dentist and she has been experiencing tooth pain and loss of teeth. I will look into dental resources for her and prescribe her with magic mouthwash prn. She may also need referral to an oral surgeon, these are not changes that I usually  see with Tagrisso  but stomatitis is not unusual. Regarding her nerve pain we discussed the option of Gabapentin and she agreed. I will prescribe Gabapentin 300mg  at bedtime. Patient does not like to take opioids. She continues to smoke about half a pack a day, which has decreased from 2 packs a day. Patient has been on Tagrisso  80mg  for the last 3 years and has recently missed 3 doses last month. She had a CT of the chest done on 08/15/2023 which revealed small T1 and T3 vertebral body sclerotic lesions that have been unchanged from September, 2024 and an incidentally imaged L2 vertebral body lesion measuring 16mm that has slightly increased in size from July, 2024. Stable posttreatment changes in the inferior right upper lobe and no disease progression in the chest was also noted. A MRI of the thoracic spine done on 12/11/2023 showed interval increase in size of the sclerotic lesions in the T1 and T2 vertebral bodies. She informed me that she has not had any treatment for her spinal lesions. I informed her of the option of bone strengthening medication such as Xgeva and Zometa. But I am concerned about the risk of osteonecrosis of the jaw, she has never previously had these medication. I will schedule a PET scan soon. Patient also had a diagnostic unilateral left mammogram and left breast ultrasound which revealed a indeterminate mass in the left breast at 2 o'clock measuring 1.5 cm. During physical exam I do feel  a firm mass in the left breast area. She informed me that she would not like any more mammograms or biopsies as she has superstitions on these affecting cancer, but I reassured her that compression does not make the cancer spread. She has not had labs in the last 6 months so I will order a CBC, CMP, and CEA for today. I will see her back in a few weeks after her scans are done. The patient was provided an opportunity to ask questions and all were answered. The patient understands all the plans discussed  today and is in agreement with them.   I provided 60 minutes of face-to-face time during this this encounter and > 50% was spent counseling as documented under my assessment and plan.   Andrea VEAR Cornish, MD [facility] A DEPT OF [facility] [address] [place] [address] Dept: [phone] Dept Fax: [phone]   CHIEF COMPLAINT:  CC:  EGFR mutation positive Stage IVA (T4 N2 M1a) lung adenocarcinoma   Current Treatment:  Tagrisso    HISTORY OF PRESENT ILLNESS:  Andrea Holloway is a 48 y.o. female with stage IVA (T4 N2 M1a) lung adenocarcinoma.  The stage IVA designation was based upon her initially having a malignant pleural effusion.  She had no evidence of distant metastasis.  As testing showed her to harbor  the EGFR mutation, she has been taking osimertinib  since July 2022.  She comes in today to go over there MRI of her thoracic spine, which was done due to her having increased back pain.  This pain has persisted over these past weeks.  She denies losing control of her bladder or bowel function.  The pain essentially does not radiate.  On another note, the patient has yet to undergo her diagnostic mammogram.  She had a recent screening mammogram which showed asymmetry in her left breast to where a diagnostic mammogram with ultrasound was requested.  She admits she has not had this study done yet, with no substantial excuse as to why.    I have reviewed her chart and materials related to her cancer extensively and collaborated history with the patient. Summary of oncologic history is as follows: Oncology History  Malignant neoplasm of upper lobe of right lung (HCC)  02/03/2021 Initial Diagnosis   Malignant neoplasm of upper lobe of right lung (HCC)   02/13/2021 - 02/13/2021 Chemotherapy         02/14/2021 Cancer Staging   Staging form: Lung, AJCC 8th Edition - Clinical stage from 02/14/2021: Stage IVA (cT4, cN2, cM1a)  - Signed by Ezzard Valaria LABOR, MD on 12/27/2023 Histopathologic type: Adenocarcinoma, NOS Stage prefix: Initial diagnosis   02/20/2021 - 02/20/2021 Chemotherapy          INTERVAL HISTORY:  Andrea Holloway is seen in the clinic for follow up of her  EGFR mutation positive L747(exon19 deletion) stage IVA (T4 N2 M1a) lung adenocarcinoma. She was placed on Tagrisso  by July, 2022 and has had a good response. Patient states that she feels ok but complains of mouth sores, dental issues with teeth falling out, sharp shooting nerve pain of the lower extremities, sore heels of the feet, upper, mid, and lower back pain. She informed me that she currently does not have an dentist and she has been experiencing tooth pain and loss of teeth. I will look into dental resources for her and prescribe her with magic mouthwash prn. She may also need referral to an  oral surgeon, these are not changes that I usually see with Tagrisso  but stomatitis is not unusual. Regarding her nerve pain we discussed the option of Gabapentin and she agreed. I will prescribe Gabapentin 300mg  at bedtime. Patient does not like to take opioids. She continues to smoke about half a pack a day, which has decreased from 2 packs a day. Patient has been on Tagrisso  80mg  for the last 3 years and has recently missed 3 doses last month. She had a CT of the chest done on 08/15/2023 which revealed small T1 and T3 vertebral body sclerotic lesions that have been unchanged from September, 2024 and an incidentally imaged L2 vertebral body lesion measuring 16mm that has slightly increased in size from July, 2024. Stable posttreatment changes in the inferior right upper lobe and no disease progression in the chest was also noted. A MRI of the thoracic spine done on 12/11/2023 showed interval increase in size of the sclerotic lesions in the T1 and T2 vertebral bodies. She informed me that she has not had any treatment for her spinal lesions. I informed her of the option of bone  strengthening medication such as Xgeva and Zometa. But I am concerned about the risk of osteonecrosis of the jaw, she has never previously had these medication. I will schedule a PET scan soon. Patient also had a diagnostic unilateral left mammogram and left breast ultrasound which revealed a indeterminate mass in the left breast at 2 o'clock measuring 1.5 cm. During physical exam I do feel a firm mass in the left breast area. She informed me that she would not like any more mammograms or biopsies as she has superstitions on these affecting cancer, but I reassured her that compression does not make the cancer spread. She has not had labs in the last 6 months so I will order a CBC, CMP, and CEA for today. I will see her back in a few weeks after her scans are done.   She denies fever, chills, night sweats, or other signs of infection. She denies cardiorespiratory and gastrointestinal issues. Her appetite is a 4/10. Her weight has decreased 1 pounds over last 2 months.   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 lung (HCC) 02/03/2021   Palpitations    Seizures (HCC)     Past Surgical History:  Procedure Laterality Date   CARDIAC SURGERY     SAC OF FLUID BEHIND HEART @ 48 YEARS OF AGE   MYRINGOTOMY WITH TUBE PLACEMENT Bilateral    TUBAL LIGATION      Family History  Problem Relation Age of Onset   Hodgkin's lymphoma Sister 109   Thyroid  cancer Maternal Grandmother 33   Prostate cancer Maternal Grandfather 58   Penile cancer Maternal Grandfather 56   Breast cancer Maternal Aunt 40   Colon cancer Cousin        dx unknown age   Colon cancer Cousin        dx unknown age   Colon cancer Cousin        dx unknown age   Lung cancer Cousin 31    Brain cancer Cousin     Social History:  reports that she has  been smoking cigarettes. She has never used smokeless tobacco. She reports that she does not currently use alcohol. She reports current drug use. Drug: Marijuana.The patient is accompanied by her sister today.  Allergies:  Allergies  Allergen Reactions   Iodinated Contrast Media Hives   Iodine     Topical iodine -does fine with Iodinated contrast- was given Isovue  300 on 08/01/2021 without complications    Current Medications: Current Outpatient Medications  Medication Sig Dispense Refill   diazepam  (VALIUM ) 5 MG tablet Take 1 tablet (5 mg total) by mouth as directed for 1 dose. Take 30 minutes before MRI. 1 tablet 0   gabapentin (NEURONTIN) 300 MG capsule Take 1 capsule (300 mg total) by mouth at bedtime. 30 capsule 5   osimertinib  mesylate (TAGRISSO ) 80 MG tablet Take 1 tablet (80 mg total) by mouth daily. 30 tablet 3   Oxycodone  HCl 10 MG TABS One tab po q4-6hr prn pain 40 tablet 0   No current facility-administered medications for this visit.   REVIEW OF SYSTEMS:  Review of Systems  Constitutional:  Positive for appetite change. Negative for chills, diaphoresis, fatigue, fever and unexpected weight change.  HENT:   Positive for mouth sores. Negative for hearing loss, lump/mass, nosebleeds, sore throat, tinnitus, trouble swallowing and voice change.        Dental issues (teeth pain and tooth loss)  Eyes: Negative.  Negative for eye problems and icterus.  Respiratory: Negative.  Negative for chest tightness, cough, hemoptysis, shortness of breath and wheezing.   Cardiovascular: Negative.  Negative for chest pain, leg swelling and palpitations.  Gastrointestinal: Negative.  Negative for abdominal distention, abdominal pain, blood in stool, constipation, diarrhea, nausea, rectal pain and vomiting.  Endocrine: Negative.   Genitourinary: Negative.  Negative for bladder incontinence, difficulty urinating, dyspareunia, dysuria,  frequency, hematuria, menstrual problem, nocturia, pelvic pain, vaginal bleeding and vaginal discharge.   Musculoskeletal:  Positive for back pain (upper and lower back pain, occasionally mid). Negative for arthralgias, flank pain, gait problem, myalgias, neck pain and neck stiffness.       Sore heels of the feet  Skin: Negative.  Negative for itching, rash and wound.  Neurological:  Negative for dizziness, extremity weakness, gait problem, headaches, light-headedness, numbness, seizures and speech difficulty.       Sharp nerve pain  Hematological: Negative.  Negative for adenopathy. Does not bruise/bleed easily.  Psychiatric/Behavioral: Negative.  Negative for confusion, decreased concentration, depression, sleep disturbance and suicidal ideas. The patient is not nervous/anxious.     VITALS:  Blood pressure (!) 152/89, pulse 80, temperature 97.9 F (36.6 C), temperature source Oral, resp. rate 18, height 5' 4 (1.626 m), weight 140 lb 6.4 oz (63.7 kg), last menstrual period 12/04/2020, SpO2 100%.  Wt Readings from Last 3 Encounters:  02/28/24 140 lb 6.4 oz (63.7 kg)  12/24/23 141 lb 9.6 oz (64.2 kg)  09/30/23 138 lb 8 oz (62.8 kg)  Body mass index is 24.1 kg/m.  Performance status (ECOG): 1 - Symptomatic but completely ambulatory  PHYSICAL EXAM:  Physical Exam Vitals and nursing note reviewed. Exam conducted with a chaperone present.  Constitutional:      General: She is not in acute distress.    Appearance: Normal appearance. She is normal weight. She is not ill-appearing, toxic-appearing or diaphoretic.  HENT:     Head: Normocephalic and atraumatic.     Right Ear: Tympanic membrane, ear canal and external ear normal. There is no impacted cerumen.     Left Ear:  Tympanic membrane, ear canal and external ear normal. There is no impacted cerumen.     Nose: Nose normal. No congestion or rhinorrhea.     Mouth/Throat:     Mouth: Mucous membranes are moist. Oral lesions present.      Pharynx: Oropharynx is clear. No oropharyngeal exudate or posterior oropharyngeal erythema.     Comments: She had a small sore of the inner lower buccal mucosa and another on the upper right with some diffuse erythema. She has receeding gums. She is missing 2 teeth  Eyes:     General: No scleral icterus.       Right eye: No discharge.        Left eye: No discharge.     Extraocular Movements: Extraocular movements intact.     Conjunctiva/sclera: Conjunctivae normal.     Pupils: Pupils are equal, round, and reactive to light.   Neck:     Vascular: No carotid bruit.   Cardiovascular:     Rate and Rhythm: Normal rate and regular rhythm.     Pulses: Normal pulses.     Heart sounds: Normal heart sounds. No murmur heard.    No friction rub. No gallop.  Pulmonary:     Effort: Pulmonary effort is normal. No respiratory distress.     Breath sounds: Normal breath sounds. No stridor. No wheezing, rhonchi or rales.  Chest:     Chest wall: No tenderness.     Comments: A small, seemingly benign skin tag/lesion is 3-4 cm medial to her left breast Well healed scar in the far upper right chest  Abdominal:     General: Bowel sounds are normal. There is no distension.     Palpations: Abdomen is soft. There is no hepatomegaly, splenomegaly or mass.     Tenderness: There is no abdominal tenderness. There is no right CVA tenderness, left CVA tenderness, guarding or rebound.     Hernia: No hernia is present.   Musculoskeletal:        General: No swelling, tenderness, deformity or signs of injury. Normal range of motion.     Cervical back: Normal range of motion and neck supple. No rigidity or tenderness.     Right lower leg: No edema.     Left lower leg: No edema.     Comments: No spinal tenderness  Feet:     Comments: Lesion medial to the right 5th toe   Lymphadenopathy:     Cervical: No cervical adenopathy.     Right cervical: No superficial, deep or posterior cervical adenopathy.    Left  cervical: No superficial, deep or posterior cervical adenopathy.     Upper Body:     Right upper body: No supraclavicular, axillary or pectoral adenopathy.     Left upper body: No supraclavicular, axillary or pectoral adenopathy.   Skin:    General: Skin is warm and dry.     Coloration: Skin is not jaundiced or pale.     Findings: No bruising, erythema, lesion or rash.   Neurological:     General: No focal deficit present.     Mental Status: She is alert and oriented to person, place, and time. Mental status is at baseline.     Cranial Nerves: No cranial nerve deficit.     Sensory: No sensory deficit.     Motor: No weakness.     Coordination: Coordination normal.     Gait: Gait normal.     Deep Tendon Reflexes: Reflexes normal.  Psychiatric:        Mood and Affect: Mood normal.        Behavior: Behavior normal.        Thought Content: Thought content normal.        Judgment: Judgment normal.    LABS:      Latest Ref Rng & Units 02/28/2024   10:16 AM 03/20/2023   12:00 AM 11/02/2022   12:00 AM  CBC  WBC 4.0 - 10.5 K/uL 5.4  5.7     6.0      Hemoglobin 12.0 - 15.0 g/dL 85.4  85.9     85.0      Hematocrit 36.0 - 46.0 % 42.5  40     43      Platelets 150 - 400 K/uL 159  145     156         This result is from an external source.      Latest Ref Rng & Units 02/28/2024   10:16 AM 03/20/2023   12:00 AM 11/02/2022   12:00 AM  CMP  Glucose 70 - 99 mg/dL 97     BUN 6 - 20 mg/dL 9  9     7       Creatinine 0.44 - 1.00 mg/dL 9.13  0.8     0.6      Sodium 135 - 145 mmol/L 138  137     139      Potassium 3.5 - 5.1 mmol/L 3.9  3.7     4.1      Chloride 98 - 111 mmol/L 103  103     107      CO2 22 - 32 mmol/L 25  23     24       Calcium 8.9 - 10.3 mg/dL 9.8  9.2     9.2      Total Protein 6.5 - 8.1 g/dL 7.2     Total Bilirubin 0.0 - 1.2 mg/dL 0.5     Alkaline Phos 38 - 126 U/L 89  66     70      AST 15 - 41 U/L 20  27     22       ALT 0 - 44 U/L 8  13     13          This result is  from an external source.   No results found for: CEA1, CEA / No results found for: CEA1, CEA No results found for: PSA1 No results found for: CAN199 No results found for: CAN125  No results found for: TOTALPROTELP, ALBUMINELP, A1GS, A2GS, BETS, BETA2SER, GAMS, MSPIKE, SPEI No results found for: TIBC, FERRITIN, IRONPCTSAT No results found for: LDH  STUDIES:  MM 3D DIAGNOSTIC MAMMOGRAM UNILATERAL LEFT BREAST Result Date: 02/14/2024 CLINICAL DATA:  48 year old female presenting as a recall from screening performed in December 2024 for a left breast mass. EXAM: DIGITAL DIAGNOSTIC UNILATERAL LEFT MAMMOGRAM WITH TOMOSYNTHESIS AND CAD; ULTRASOUND LEFT BREAST LIMITED TECHNIQUE: Left digital diagnostic mammography and breast tomosynthesis was performed. The images were evaluated with computer-aided detection. ; Targeted ultrasound examination of the left breast was performed. COMPARISON:  Previous exam(s). ACR Breast Density Category c: The breasts are heterogeneously dense, which may obscure small masses. FINDINGS: Mammogram: Left breast: Full field tomosynthesis views of the left breast were performed. There is an obscured mass in the upper outer posterior left breast measuring approximately 1.5 cm. No additional findings elsewhere in the left breast. Ultrasound: Targeted  ultrasound is performed in the left breast at 2 o'clock 12 cm from the nipple demonstrating a parallel hypoechoic mass with some indistinct margins overall measuring 1.5 x 0.6 x 1.2 cm. This corresponds to the mammographic finding. Targeted ultrasound of the left axilla demonstrates normal lymph nodes. IMPRESSION: Indeterminate mass in the left breast at 2 o'clock measuring 1.5 cm. RECOMMENDATION: Left breast ultrasound in 6 months. Discussed with patient that left breast biopsy is recommended for the indeterminate mass however patient declines biopsy at this time but agrees to short-term follow-up. I  have discussed the findings and recommendations with the patient. If applicable, a reminder letter will be sent to the patient regarding the next appointment. BI-RADS CATEGORY  4: Suspicious. Electronically Signed   By: Inocente Ast M.D.   On: 02/14/2024 11:54   US  LIMITED ULTRASOUND INCLUDING AXILLA LEFT BREAST  Result Date: 02/14/2024 CLINICAL DATA:  48 year old female presenting as a recall from screening performed in December 2024 for a left breast mass. EXAM: DIGITAL DIAGNOSTIC UNILATERAL LEFT MAMMOGRAM WITH TOMOSYNTHESIS AND CAD; ULTRASOUND LEFT BREAST LIMITED TECHNIQUE: Left digital diagnostic mammography and breast tomosynthesis was performed. The images were evaluated with computer-aided detection. ; Targeted ultrasound examination of the left breast was performed. COMPARISON:  Previous exam(s). ACR Breast Density Category c: The breasts are heterogeneously dense, which may obscure small masses. FINDINGS: Mammogram: Left breast: Full field tomosynthesis views of the left breast were performed. There is an obscured mass in the upper outer posterior left breast measuring approximately 1.5 cm. No additional findings elsewhere in the left breast. Ultrasound: Targeted ultrasound is performed in the left breast at 2 o'clock 12 cm from the nipple demonstrating a parallel hypoechoic mass with some indistinct margins overall measuring 1.5 x 0.6 x 1.2 cm. This corresponds to the mammographic finding. Targeted ultrasound of the left axilla demonstrates normal lymph nodes. IMPRESSION: Indeterminate mass in the left breast at 2 o'clock measuring 1.5 cm. RECOMMENDATION: Left breast ultrasound in 6 months. Discussed with patient that left breast biopsy is recommended for the indeterminate mass however patient declines biopsy at this time but agrees to short-term follow-up. I have discussed the findings and recommendations with the patient. If applicable, a reminder letter will be sent to the patient regarding the  next appointment. BI-RADS CATEGORY  4: Suspicious. Electronically Signed   By: Inocente Ast M.D.   On: 02/14/2024 11:54      I,Jasmine M Lassiter,acting as a scribe for Andrea VEAR Cornish, MD.,have documented all relevant documentation on the behalf of Andrea VEAR Cornish, MD,as directed by  Andrea VEAR Cornish, MD while in the presence of Andrea VEAR Cornish, MD.

## 2024-02-28 NOTE — Telephone Encounter (Signed)
 Patient has been scheduled for follow-up visit per 02/26/24 LOS.  LVM notifying pt of appt details, provided my direct number to pt if appt changes need to be made.

## 2024-03-02 ENCOUNTER — Telehealth: Payer: Self-pay | Admitting: Oncology

## 2024-03-02 NOTE — Telephone Encounter (Signed)
 03/02/24 Spoke with patient about PET SCAN on 03/11/24 arrive at 830am.DV ON 03/25/24@3PM 

## 2024-03-03 ENCOUNTER — Telehealth: Payer: Self-pay

## 2024-03-03 DIAGNOSIS — K121 Other forms of stomatitis: Secondary | ICD-10-CM | POA: Insufficient documentation

## 2024-03-03 DIAGNOSIS — N6320 Unspecified lump in the left breast, unspecified quadrant: Secondary | ICD-10-CM | POA: Insufficient documentation

## 2024-03-03 NOTE — Telephone Encounter (Signed)
 Attempted to contact patient. No answer.

## 2024-03-03 NOTE — Telephone Encounter (Signed)
 Patient called stating that she needed her MMW sent to pharmacy. Called MMW to pharmacy on Friday to [facility] [address]   Attempted to contact patient to let her know it should be at pharmacy. Left VM for patient to return my call.

## 2024-03-03 NOTE — Telephone Encounter (Signed)
-----   Message from [place] sent at 03/03/2024 11:33 AM EDT ----- Regarding: RE: Orders for meds and walker Spoke with patient and she needs mouth wash.Also needs walker/cane ----- Message ----- From: Janit Jon HERO, LPN Sent: 10/11/7972  11:17 AM EDT To: Karna Raspberry Subject: RE: Orders for meds and walker                 Do you happen to know what medication was suppose to be sent in? I see that the Gabapentin was sent. Let me know what I need to do.  Jon, LPN ----- Message ----- From: Raspberry Karna Sent: 03/02/2024   4:29 PM EDT To: Jon HERO Janit, LPN Subject: Orders for meds and walker                     Patient asking about medication refills and if order for walker had been sent.

## 2024-03-13 ENCOUNTER — Telehealth: Payer: Self-pay | Admitting: Oncology

## 2024-03-13 NOTE — Telephone Encounter (Signed)
 Patient has been scheduled for follow-up visit per 03/13/24 LOS.  LVM notifying pt of appt details, provided my direct number to pt if appt changes need to be made.

## 2024-03-16 ENCOUNTER — Telehealth: Payer: Self-pay

## 2024-03-16 NOTE — Telephone Encounter (Signed)
 Pt calling asking for PET scan results. She was told that Dr Cornelius wouldn't be back in office until the 17th.  Is there anyone else who can give me the results or release them so I can read them on MyChart?

## 2024-03-19 ENCOUNTER — Encounter: Payer: Self-pay | Admitting: Oncology

## 2024-03-19 ENCOUNTER — Inpatient Hospital Stay: Attending: Oncology | Admitting: Oncology

## 2024-03-19 ENCOUNTER — Other Ambulatory Visit: Payer: Self-pay | Admitting: Oncology

## 2024-03-19 ENCOUNTER — Telehealth: Payer: Self-pay | Admitting: Oncology

## 2024-03-19 VITALS — BP 137/87 | HR 79 | Temp 97.7°F | Resp 18 | Ht 64.0 in | Wt 140.6 lb

## 2024-03-19 DIAGNOSIS — J910 Malignant pleural effusion: Secondary | ICD-10-CM | POA: Diagnosis not present

## 2024-03-19 DIAGNOSIS — F17210 Nicotine dependence, cigarettes, uncomplicated: Secondary | ICD-10-CM | POA: Diagnosis not present

## 2024-03-19 DIAGNOSIS — Z808 Family history of malignant neoplasm of other organs or systems: Secondary | ICD-10-CM | POA: Insufficient documentation

## 2024-03-19 DIAGNOSIS — Z9221 Personal history of antineoplastic chemotherapy: Secondary | ICD-10-CM | POA: Insufficient documentation

## 2024-03-19 DIAGNOSIS — K121 Other forms of stomatitis: Secondary | ICD-10-CM | POA: Diagnosis not present

## 2024-03-19 DIAGNOSIS — C3411 Malignant neoplasm of upper lobe, right bronchus or lung: Secondary | ICD-10-CM | POA: Insufficient documentation

## 2024-03-19 DIAGNOSIS — N6321 Unspecified lump in the left breast, upper outer quadrant: Secondary | ICD-10-CM | POA: Insufficient documentation

## 2024-03-19 DIAGNOSIS — Z8042 Family history of malignant neoplasm of prostate: Secondary | ICD-10-CM | POA: Insufficient documentation

## 2024-03-19 DIAGNOSIS — Z801 Family history of malignant neoplasm of trachea, bronchus and lung: Secondary | ICD-10-CM | POA: Insufficient documentation

## 2024-03-19 DIAGNOSIS — Z800 Family history of malignant neoplasm of digestive organs: Secondary | ICD-10-CM | POA: Diagnosis not present

## 2024-03-19 DIAGNOSIS — F1290 Cannabis use, unspecified, uncomplicated: Secondary | ICD-10-CM | POA: Insufficient documentation

## 2024-03-19 DIAGNOSIS — Z803 Family history of malignant neoplasm of breast: Secondary | ICD-10-CM | POA: Diagnosis not present

## 2024-03-19 DIAGNOSIS — Z807 Family history of other malignant neoplasms of lymphoid, hematopoietic and related tissues: Secondary | ICD-10-CM | POA: Diagnosis not present

## 2024-03-19 DIAGNOSIS — M898X8 Other specified disorders of bone, other site: Secondary | ICD-10-CM | POA: Diagnosis not present

## 2024-03-19 NOTE — Progress Notes (Signed)
 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 These are consistent with metastases but she has not had scans in 6 months and I think we need to reassess. A PET scan would be helpful to see if these lesions would light up and look for the status of her lung primary and assess any other metastatic disease. She declines a spine biopsy. She is having escalating pain and radicular neuropathy so we will start her on Gabapentin  300 mg HS. PET scan done on 03/11/2024 which revealed a 1.8 x 1.3cm nodular area of soft tissue density airspace consolidation, in the upper lobe of the right lung with a max SUV of 3.8 and 1.8 x 1.2cm area of soft tissue density airspace consolidation, in the middle lobe of the right lung with a max SUV of 9.4. No uptake was seen elsewhere and no skeletal lesions were found.  Possible Mass in the Upper Outer Quadrant of the Posterior Left Breast This measures 1.5cm. this is partially obscured and indistinct and her breasts are heterogeneously dense. Examination is more consistent with fibrocystic changes and she refuses a biopsy at this time. We will plan to repeat a ultrasound in 6 months.   Dental Issues The stomatitis is probably related to the Tagrisso  but her receeding gums and teeth falling out is not usually seen with this. She needs evaluation by a dentist and/or oral surgeon.   Tobacco Abuse She has cut back but continues to smoke 1 half pack per day.   Plan: Patient states that her sharp shooting lower extremity pain has resolved on its own. Her CBC, CMP, and CEA on 02/28/2024 were normal. She had a PET scan done on 03/11/2024 which revealed a 1.8 x 1.3cm nodular area of  soft tissue density airspace consolidation, in the upper lobe of the right lung with a max SUV of 3.8 and 1.8 x 1.2cm area of soft tissue density airspace consolidation, in the middle lobe of the right lung with a max SUV of 9.4. No uptake was seen elsewhere and no skeletal lesions were found. I reassured her that staying on Tagrisso  is the best option and we will continue regular checkups. I will see her back in 3 months with CBC and CMP. She will be due for a ultrasound of the left breast in December or January. The patient was provided an opportunity to ask questions and all were answered. The patient understands all the plans discussed today and is in agreement with them.   I provided 23 minutes of face-to-face time during this this encounter and > 50% was spent counseling as documented under my assessment and plan.   [name] Cornelius, MD [facility] OF [facility] [address] [place] [address] Dept: [phone] Dept Fax: [phone]   CHIEF COMPLAINT:  CC:  EGFR mutation positive Stage IVA (T4 N2 M1a) lung adenocarcinoma   Current Treatment:  Tagrisso    HISTORY OF PRESENT ILLNESS:  Andrea Holloway is a 48 y.o. female with stage IVA (T4 N2 M1a) lung adenocarcinoma.  The stage IVA designation was based upon her initially having a malignant pleural effusion.  She had no evidence of distant metastasis.  As testing showed her to harbor  the EGFR mutation, she has been taking osimertinib  since July 2022.  She comes in today to go over there MRI of her thoracic spine, which was done due to her having increased back pain.  This pain has persisted over these past weeks.  She denies losing control of her bladder or bowel function.  The pain essentially does not radiate.   She had a screening mammogram in December 2024 which revealed architectural distortion in the right breast which corresponds to the site of her biopsy scar.   However in the left breast there is an isodense mass and a diagnostic mammogram with ultrasound was requested.  She finally had the diagnostic mammogram on February 14, 2024 and her breasts are heterogeneously dense.  In the upper outer quadrant of the posterior left breast there is an obscured mass measuring 1.5 cm.  The ultrasound confirmed I have a hypoechoic mass with indistinct margins measuring 1.5 cm and located at 2:00, 12 cm from the nipple.  An ultrasound biopsy was recommended but the patient declined.  They therefore recommended a repeat left breast ultrasound in 6 months.  The patient does not want to have any biopsies done at this time.  He also declines a biopsy of the thoracic spine.  I have reviewed her chart and materials related to her cancer extensively and collaborated history with the patient. Summary of oncologic history is as follows: Oncology History  Malignant neoplasm of upper lobe of right lung (HCC)  02/03/2021 Initial Diagnosis   Malignant neoplasm of upper lobe of right lung (HCC)   02/13/2021 - 02/13/2021 Chemotherapy         02/14/2021 Cancer Staging   Staging form: Lung, AJCC 8th Edition - Clinical stage from 02/14/2021: Stage IVA (cT4, cN2, cM1a) - Signed by Ezzard Valaria LABOR, MD on 12/27/2023 Histopathologic type: Adenocarcinoma, NOS Stage prefix: Initial diagnosis   02/20/2021 - 02/20/2021 Chemotherapy          INTERVAL HISTORY:  Andrea Holloway is seen in the clinic for follow up of her  EGFR mutation positive L747(exon19 deletion) stage IVA (T4 N2 M1a) lung adenocarcinoma. She was placed on Tagrisso  by July, 2022 and has had a good response. Patient states that she feels well and has no complaints of pain. She states that her sharp shooting lower extremity pain has resolved on its own. Her CBC, CMP, and CEA on 02/28/2024 were normal. She had a PET scan done on 03/11/2024 which revealed a 1.8 x 1.3cm nodular area of soft tissue density airspace consolidation, in the upper  lobe of the right lung with a max SUV of 3.8 and 1.8 x 1.2cm area of soft tissue density airspace consolidation, in the middle lobe of the right lung with a max SUV of 9.4. No uptake was seen elsewhere and no skeletal lesions were found. I reassured her that staying on Tagrisso  is the best option and we will continue regular checkups. I will see her back in 3 months with CBC and CMP. She will be due for a ultrasound of the left breast in December or January.    She denies fever, chills, night sweats, or other signs of infection. She denies cardiorespiratory and gastrointestinal issues. She  denies pain. Her appetite is 50% and Her weight has been stable. Her sister accompanied her today and is a cancer survivor herself.  HISTORY:   Past Medical History:  Diagnosis Date   Acne  vulgaris 04/03/2022   Adenocarcinoma of lung, stage 4 (HCC) 01/10/2022   Anxiety    Cardiac murmur 05/01/2022   Chest pain    Current smoker 01/10/2022   Depression    Lung cancer (HCC)    Malignant neoplasm of upper lobe of right lung (HCC) 02/03/2021   Palpitations    Seizures (HCC)     Past Surgical History:  Procedure Laterality Date   CARDIAC SURGERY     SAC OF FLUID BEHIND HEART @ 48 YEARS OF AGE   MYRINGOTOMY WITH TUBE PLACEMENT Bilateral    TUBAL LIGATION      Family History  Problem Relation Age of Onset   Hodgkin's lymphoma Sister 20   Thyroid  cancer Maternal Grandmother 36   Prostate cancer Maternal Grandfather 82   Penile cancer Maternal Grandfather 30   Breast cancer Maternal Aunt 40   Colon cancer Cousin        dx unknown age   Colon cancer Cousin        dx unknown age   Colon cancer Cousin        dx unknown age   Lung cancer Cousin 31   Brain cancer Cousin     Social History:  reports that she has been smoking cigarettes. She has never used smokeless tobacco. She reports that she does not currently use alcohol. She reports current drug use. Drug: Marijuana.The patient is accompanied  by her sister today.  Allergies:  Allergies  Allergen Reactions   Iodinated Contrast Media Hives   Iodine     Topical iodine -does fine with Iodinated contrast- was given Isovue  300 on 08/01/2021 without complications    Current Medications: Current Outpatient Medications  Medication Sig Dispense Refill   diazepam  (VALIUM ) 5 MG tablet Take 1 tablet (5 mg total) by mouth as directed for 1 dose. Take 30 minutes before MRI. 1 tablet 0   gabapentin  (NEURONTIN ) 300 MG capsule Take 1 capsule (300 mg total) by mouth at bedtime. 30 capsule 5   osimertinib  mesylate (TAGRISSO ) 80 MG tablet Take 1 tablet (80 mg total) by mouth daily. 30 tablet 3   Oxycodone  HCl 10 MG TABS One tab po q4-6hr prn pain 40 tablet 0   No current facility-administered medications for this visit.   REVIEW OF SYSTEMS:  Review of Systems  Constitutional:  Positive for appetite change (50%). Negative for chills, diaphoresis, fatigue, fever and unexpected weight change.  HENT:   Positive for mouth sores. Negative for hearing loss, lump/mass, nosebleeds, sore throat, tinnitus, trouble swallowing and voice change.        Dental issues (teeth pain and tooth loss)  Eyes: Negative.  Negative for eye problems and icterus.  Respiratory: Negative.  Negative for chest tightness, cough, hemoptysis, shortness of breath and wheezing.   Cardiovascular: Negative.  Negative for chest pain, leg swelling and palpitations.  Gastrointestinal: Negative.  Negative for abdominal distention, abdominal pain, blood in stool, constipation, diarrhea, nausea, rectal pain and vomiting.  Endocrine: Negative.   Genitourinary: Negative.  Negative for bladder incontinence, difficulty urinating, dyspareunia, dysuria, frequency, hematuria, menstrual problem, nocturia, pelvic pain, vaginal bleeding and vaginal discharge.   Musculoskeletal:  Negative for arthralgias, back pain, flank pain, gait problem, myalgias, neck pain and neck stiffness.  Skin: Negative.   Negative for itching, rash and wound.  Neurological:  Negative for dizziness, extremity weakness, gait problem, headaches, light-headedness, numbness, seizures and speech difficulty.  Hematological: Negative.  Negative for adenopathy. Does not bruise/bleed easily.  Psychiatric/Behavioral: Negative.  Negative for confusion, decreased concentration, depression, sleep disturbance and suicidal ideas. The patient is not nervous/anxious.     VITALS:  Blood pressure 137/87, pulse 79, temperature 97.7 F (36.5 C), temperature source Oral, resp. rate 18, height 5' 4 (1.626 m), weight 140 lb 9.6 oz (63.8 kg), last menstrual period 12/04/2020, SpO2 97%.  Wt Readings from Last 3 Encounters:  03/19/24 140 lb 9.6 oz (63.8 kg)  02/28/24 140 lb 6.4 oz (63.7 kg)  12/24/23 141 lb 9.6 oz (64.2 kg)  Body mass index is 24.13 kg/m.  Performance status (ECOG): 1 - Symptomatic but completely ambulatory  PHYSICAL EXAM:  Physical Exam Vitals and nursing note reviewed. Exam conducted with a chaperone present.  Constitutional:      General: She is not in acute distress.    Appearance: Normal appearance. She is normal weight. She is not ill-appearing, toxic-appearing or diaphoretic.  HENT:     Head: Normocephalic and atraumatic.     Right Ear: Tympanic membrane, ear canal and external ear normal. There is no impacted cerumen.     Left Ear: Tympanic membrane, ear canal and external ear normal. There is no impacted cerumen.     Nose: Nose normal. No congestion or rhinorrhea.     Mouth/Throat:     Mouth: Mucous membranes are moist.     Pharynx: Oropharynx is clear. No oropharyngeal exudate or posterior oropharyngeal erythema.  Eyes:     General: No scleral icterus.       Right eye: No discharge.        Left eye: No discharge.     Extraocular Movements: Extraocular movements intact.     Conjunctiva/sclera: Conjunctivae normal.     Pupils: Pupils are equal, round, and reactive to light.  Neck:     Vascular: No  carotid bruit.  Cardiovascular:     Rate and Rhythm: Normal rate and regular rhythm.     Pulses: Normal pulses.     Heart sounds: Normal heart sounds. No murmur heard.    No friction rub. No gallop.  Pulmonary:     Effort: Pulmonary effort is normal. No respiratory distress.     Breath sounds: Normal breath sounds. No stridor. No wheezing, rhonchi or rales.  Chest:     Chest wall: No tenderness.     Comments: A small, seemingly benign skin tag/lesion is 3-4 cm medial to her left breast Well healed scar in the far upper right chest  Abdominal:     General: Bowel sounds are normal. There is no distension.     Palpations: Abdomen is soft. There is no hepatomegaly, splenomegaly or mass.     Tenderness: There is no abdominal tenderness. There is no right CVA tenderness, left CVA tenderness, guarding or rebound.     Hernia: No hernia is present.  Musculoskeletal:        General: No swelling, tenderness, deformity or signs of injury. Normal range of motion.     Cervical back: Normal range of motion and neck supple. No rigidity or tenderness.     Right lower leg: No edema.     Left lower leg: No edema.  Lymphadenopathy:     Cervical: No cervical adenopathy.     Right cervical: No superficial, deep or posterior cervical adenopathy.    Left cervical: No superficial, deep or posterior cervical adenopathy.     Upper Body:     Right upper body: No supraclavicular, axillary or pectoral adenopathy.     Left upper body: No  supraclavicular, axillary or pectoral adenopathy.  Skin:    General: Skin is warm and dry.     Coloration: Skin is not jaundiced or pale.     Findings: No bruising, erythema, lesion or rash.  Neurological:     General: No focal deficit present.     Mental Status: She is alert and oriented to person, place, and time. Mental status is at baseline.     Cranial Nerves: No cranial nerve deficit.     Sensory: No sensory deficit.     Motor: No weakness.     Coordination:  Coordination normal.     Gait: Gait normal.     Deep Tendon Reflexes: Reflexes normal.  Psychiatric:        Mood and Affect: Mood normal.        Behavior: Behavior normal.        Thought Content: Thought content normal.        Judgment: Judgment normal.    LABS:      Latest Ref Rng & Units 02/28/2024   10:16 AM 03/20/2023   12:00 AM 11/02/2022   12:00 AM  CBC  WBC 4.0 - 10.5 K/uL 5.4  5.7     6.0      Hemoglobin 12.0 - 15.0 g/dL 85.4  85.9     85.0      Hematocrit 36.0 - 46.0 % 42.5  40     43      Platelets 150 - 400 K/uL 159  145     156         This result is from an external source.      Latest Ref Rng & Units 02/28/2024   10:16 AM 03/20/2023   12:00 AM 11/02/2022   12:00 AM  CMP  Glucose 70 - 99 mg/dL 97     BUN 6 - 20 mg/dL 9  9     7       Creatinine 0.44 - 1.00 mg/dL 9.13  0.8     0.6      Sodium 135 - 145 mmol/L 138  137     139      Potassium 3.5 - 5.1 mmol/L 3.9  3.7     4.1      Chloride 98 - 111 mmol/L 103  103     107      CO2 22 - 32 mmol/L 25  23     24       Calcium 8.9 - 10.3 mg/dL 9.8  9.2     9.2      Total Protein 6.5 - 8.1 g/dL 7.2     Total Bilirubin 0.0 - 1.2 mg/dL 0.5     Alkaline Phos 38 - 126 U/L 89  66     70      AST 15 - 41 U/L 20  27     22       ALT 0 - 44 U/L 8  13     13          This result is from an external source.   Lab Results  Component Value Date   CEA 2.82 02/28/2024   /  CEA ([facility])  Date Value Ref Range Status  02/28/2024 2.82 0.00 - 5.00 ng/mL Final    Comment:    (NOTE) This test was performed using [employer]'s paramagnetic chemiluminescent immunoassay. Values obtained from different assay methods cannot be used interchangeably. Please note that up to 8% of patients who  smoke may see values 5.1-10.0 ng/ml and 1% of patients who smoke may see CEA levels >10.0 ng/ml. Performed at [facility], [address] [place], [place] [address]    No results found for: PSA1 No results found for:  RJW800 No results found for: CAN125  No results found for: TOTALPROTELP, ALBUMINELP, A1GS, A2GS, BETS, BETA2SER, GAMS, MSPIKE, SPEI No results found for: TIBC, FERRITIN, IRONPCTSAT No results found for: LDH  STUDIES:  Exam: 03/11/2024 PET Skull Base to Mid Thigh Impression: There are 2 nodules in the upper lobe of the right lung demonstrating hypermetabolic activity consistent with bronchogenic carcinoma.  1.8 x 1.3cm nodular area of soft tissue density airspace consolidation, in the upper lobe of the right lung, demonstrating FDG activity with a max SUV of 3.8.  1.8 x 1.2cm area of soft tissue density airspace consolidation, in the middle lobe of the right lung, demonstrating FDG activity with a max SUV of 9.4.      I,Jasmine M Lassiter,acting as a scribe for Kellan VEAR Cornish, MD.,have documented all relevant documentation on the behalf of Anisha VEAR Cornish, MD,as directed by  Jeanet VEAR Cornish, MD while in the presence of Kobie VEAR Cornish, MD.

## 2024-03-19 NOTE — Telephone Encounter (Signed)
 Patient has been scheduled for follow-up visit per 03/19/24 LOS.  Pt given an appt calendar with date and time.

## 2024-03-20 ENCOUNTER — Other Ambulatory Visit: Payer: Self-pay

## 2024-03-20 NOTE — Progress Notes (Signed)
 Specialty Pharmacy Refill Coordination Note  Andrea Holloway is a 48 y.o. female contacted today regarding refills of specialty medication(s) Osimertinib  Mesylate (TAGRISSO )   Patient requested Delivery   Delivery date: 03/27/24   Verified address: [address] [place] [address]   Medication will be filled on 03/26/24.

## 2024-03-25 ENCOUNTER — Ambulatory Visit: Admitting: Oncology

## 2024-03-25 ENCOUNTER — Encounter: Payer: Self-pay | Admitting: *Deleted

## 2024-03-26 ENCOUNTER — Other Ambulatory Visit (HOSPITAL_COMMUNITY): Payer: Self-pay

## 2024-03-26 ENCOUNTER — Other Ambulatory Visit: Payer: Self-pay

## 2024-04-03 ENCOUNTER — Telehealth: Payer: Self-pay

## 2024-04-03 NOTE — Telephone Encounter (Signed)
 Pt's mom, Channing, would like to speak with you @ your convenience regarding her daughters treatment & condition. She states Rishika tells her that she is in remission, & she only has a couple spots in her lung. If she has a couple spots in her lung, how can she be in remission? Are the spots in her spine gone?

## 2024-04-09 ENCOUNTER — Telehealth: Payer: Self-pay

## 2024-04-09 NOTE — Telephone Encounter (Signed)
 Andrea Holloway from centralized scheduling called, she is just f/u on pt appt's that were no shows. Kealey didn't show for the bx in June 2025, ordered by Dr Ezzard. Does she still need this? If so, they will reach out to R/S.

## 2024-04-17 ENCOUNTER — Other Ambulatory Visit: Payer: Self-pay

## 2024-04-20 ENCOUNTER — Other Ambulatory Visit (HOSPITAL_COMMUNITY): Payer: Self-pay

## 2024-05-08 ENCOUNTER — Other Ambulatory Visit (HOSPITAL_COMMUNITY): Payer: Self-pay

## 2024-05-08 ENCOUNTER — Other Ambulatory Visit: Payer: Self-pay

## 2024-05-08 DIAGNOSIS — C3411 Malignant neoplasm of upper lobe, right bronchus or lung: Secondary | ICD-10-CM

## 2024-05-08 MED ORDER — OSIMERTINIB MESYLATE 80 MG PO TABS
80.0000 mg | ORAL_TABLET | Freq: Every day | ORAL | 3 refills | Status: DC
  Filled 2024-05-08 – 2024-05-11 (×2): qty 30, 30d supply, fill #0
  Filled 2024-06-05: qty 30, 30d supply, fill #1

## 2024-05-11 ENCOUNTER — Other Ambulatory Visit: Payer: Self-pay | Admitting: Pharmacy Technician

## 2024-05-11 ENCOUNTER — Other Ambulatory Visit: Payer: Self-pay

## 2024-05-11 ENCOUNTER — Other Ambulatory Visit (HOSPITAL_COMMUNITY): Payer: Self-pay

## 2024-05-11 NOTE — Progress Notes (Signed)
 Specialty Pharmacy Refill Coordination Note  Andrea Holloway is a 48 y.o. female contacted today regarding refills of specialty medication(s) Osimertinib  Mesylate (TAGRISSO )   Patient requested Delivery   Delivery date: 05/12/24   Verified address: [address]  [place] [address]   Medication will be filled on 05/12/24.  Patient upset & requests we keep calling her for refill beyond the 3 attempts. Provided patient Spec number to call if she don't hear from us  to her standards.    Offered patient to pick up med-patient declined.

## 2024-05-20 ENCOUNTER — Telehealth: Payer: Self-pay | Admitting: Oncology

## 2024-05-20 NOTE — Telephone Encounter (Signed)
 05/20/24 Patient left msg to reschedule appt.Called patient and left msg to reschedule.

## 2024-05-26 ENCOUNTER — Other Ambulatory Visit: Payer: Self-pay

## 2024-05-27 ENCOUNTER — Telehealth: Payer: Self-pay

## 2024-05-27 ENCOUNTER — Other Ambulatory Visit: Payer: Self-pay | Admitting: Oncology

## 2024-05-27 DIAGNOSIS — C3411 Malignant neoplasm of upper lobe, right bronchus or lung: Secondary | ICD-10-CM

## 2024-05-27 MED ORDER — VENLAFAXINE HCL 37.5 MG PO TABS: 37.5000 mg | ORAL_TABLET | Freq: Every day | ORAL | 5 refills | Status: DC

## 2024-05-27 NOTE — Telephone Encounter (Signed)
 Pt called, states she needs something for the hot flashes. She is having them every 15-20 minutes.

## 2024-06-02 ENCOUNTER — Inpatient Hospital Stay: Admitting: Oncology

## 2024-06-02 ENCOUNTER — Inpatient Hospital Stay

## 2024-06-02 DIAGNOSIS — C3411 Malignant neoplasm of upper lobe, right bronchus or lung: Secondary | ICD-10-CM

## 2024-06-05 ENCOUNTER — Other Ambulatory Visit: Payer: Self-pay

## 2024-06-08 ENCOUNTER — Other Ambulatory Visit: Payer: Self-pay

## 2024-06-08 DIAGNOSIS — C3411 Malignant neoplasm of upper lobe, right bronchus or lung: Secondary | ICD-10-CM

## 2024-06-08 MED ORDER — OSIMERTINIB MESYLATE 80 MG PO TABS
80.0000 mg | ORAL_TABLET | Freq: Every day | ORAL | 5 refills | Status: DC
  Filled 2024-06-09: qty 30, 30d supply, fill #0
  Filled 2024-07-03: qty 30, 30d supply, fill #1
  Filled 2024-08-03 – 2024-08-12 (×2): qty 30, 30d supply, fill #2
  Filled 2024-09-21: qty 30, 30d supply, fill #3

## 2024-06-09 ENCOUNTER — Other Ambulatory Visit: Payer: Self-pay

## 2024-06-09 NOTE — Progress Notes (Signed)
 Specialty Pharmacy Refill Coordination Note  Andrea Holloway is a 48 y.o. female contacted today regarding refills of specialty medication(s) Osimertinib  Mesylate (TAGRISSO )   Patient requested Delivery   Delivery date: 06/11/24   Verified address: [address]  [place] [address]   Medication will be filled on 06/10/24.

## 2024-06-11 ENCOUNTER — Other Ambulatory Visit: Payer: Self-pay | Admitting: Oncology

## 2024-06-11 ENCOUNTER — Inpatient Hospital Stay (HOSPITAL_BASED_OUTPATIENT_CLINIC_OR_DEPARTMENT_OTHER): Admitting: Oncology

## 2024-06-11 ENCOUNTER — Inpatient Hospital Stay: Attending: Oncology

## 2024-06-11 ENCOUNTER — Encounter: Payer: Self-pay | Admitting: Oncology

## 2024-06-11 VITALS — BP 126/75 | HR 88 | Temp 97.8°F | Resp 16 | Ht 64.0 in | Wt 139.4 lb

## 2024-06-11 DIAGNOSIS — E876 Hypokalemia: Secondary | ICD-10-CM | POA: Diagnosis not present

## 2024-06-11 DIAGNOSIS — Z808 Family history of malignant neoplasm of other organs or systems: Secondary | ICD-10-CM | POA: Insufficient documentation

## 2024-06-11 DIAGNOSIS — Z801 Family history of malignant neoplasm of trachea, bronchus and lung: Secondary | ICD-10-CM | POA: Insufficient documentation

## 2024-06-11 DIAGNOSIS — R7989 Other specified abnormal findings of blood chemistry: Secondary | ICD-10-CM | POA: Diagnosis not present

## 2024-06-11 DIAGNOSIS — Z800 Family history of malignant neoplasm of digestive organs: Secondary | ICD-10-CM | POA: Diagnosis not present

## 2024-06-11 DIAGNOSIS — N6321 Unspecified lump in the left breast, upper outer quadrant: Secondary | ICD-10-CM | POA: Insufficient documentation

## 2024-06-11 DIAGNOSIS — Z8042 Family history of malignant neoplasm of prostate: Secondary | ICD-10-CM | POA: Diagnosis not present

## 2024-06-11 DIAGNOSIS — R232 Flushing: Secondary | ICD-10-CM | POA: Diagnosis not present

## 2024-06-11 DIAGNOSIS — R110 Nausea: Secondary | ICD-10-CM | POA: Insufficient documentation

## 2024-06-11 DIAGNOSIS — Z803 Family history of malignant neoplasm of breast: Secondary | ICD-10-CM | POA: Diagnosis not present

## 2024-06-11 DIAGNOSIS — F17210 Nicotine dependence, cigarettes, uncomplicated: Secondary | ICD-10-CM | POA: Diagnosis not present

## 2024-06-11 DIAGNOSIS — Z9221 Personal history of antineoplastic chemotherapy: Secondary | ICD-10-CM | POA: Diagnosis not present

## 2024-06-11 DIAGNOSIS — Z79899 Other long term (current) drug therapy: Secondary | ICD-10-CM | POA: Diagnosis not present

## 2024-06-11 DIAGNOSIS — K121 Other forms of stomatitis: Secondary | ICD-10-CM | POA: Diagnosis not present

## 2024-06-11 DIAGNOSIS — C3411 Malignant neoplasm of upper lobe, right bronchus or lung: Secondary | ICD-10-CM | POA: Diagnosis not present

## 2024-06-11 DIAGNOSIS — Z807 Family history of other malignant neoplasms of lymphoid, hematopoietic and related tissues: Secondary | ICD-10-CM | POA: Insufficient documentation

## 2024-06-11 LAB — CBC WITH DIFFERENTIAL (CANCER CENTER ONLY)
Abs Immature Granulocytes: 0.01 K/uL (ref 0.00–0.07)
Basophils Absolute: 0 K/uL (ref 0.0–0.1)
Basophils Relative: 0 %
Eosinophils Absolute: 0 K/uL (ref 0.0–0.5)
Eosinophils Relative: 0 %
HCT: 40.2 % (ref 36.0–46.0)
Hemoglobin: 14 g/dL (ref 12.0–15.0)
Immature Granulocytes: 0 %
Lymphocytes Relative: 36 %
Lymphs Abs: 1.8 K/uL (ref 0.7–4.0)
MCH: 32.4 pg (ref 26.0–34.0)
MCHC: 34.8 g/dL (ref 30.0–36.0)
MCV: 93.1 fL (ref 80.0–100.0)
Monocytes Absolute: 0.3 K/uL (ref 0.1–1.0)
Monocytes Relative: 7 %
Neutro Abs: 2.7 K/uL (ref 1.7–7.7)
Neutrophils Relative %: 57 %
Platelet Count: 143 K/uL — ABNORMAL LOW (ref 150–400)
RBC: 4.32 MIL/uL (ref 3.87–5.11)
RDW: 12.6 % (ref 11.5–15.5)
WBC Count: 4.8 K/uL (ref 4.0–10.5)
nRBC: 0 % (ref 0.0–0.2)

## 2024-06-11 LAB — CMP (CANCER CENTER ONLY)
ALT: <5 U/L (ref 0–44)
AST: 17 U/L (ref 15–41)
Albumin: 4.5 g/dL (ref 3.5–5.0)
Alkaline Phosphatase: 83 U/L (ref 38–126)
Anion gap: 12 (ref 5–15)
BUN: 9 mg/dL (ref 6–20)
CO2: 25 mmol/L (ref 22–32)
Calcium: 9.5 mg/dL (ref 8.9–10.3)
Chloride: 103 mmol/L (ref 98–111)
Creatinine: 1.07 mg/dL — ABNORMAL HIGH (ref 0.44–1.00)
GFR, Estimated: >60 mL/min (ref >60)
Glucose, Bld: 98 mg/dL (ref 70–99)
Potassium: 3.3 mmol/L — ABNORMAL LOW (ref 3.5–5.1)
Sodium: 139 mmol/L (ref 135–145)
Total Bilirubin: 0.4 mg/dL (ref 0.0–1.2)
Total Protein: 6.9 g/dL (ref 6.5–8.1)

## 2024-06-11 MED ORDER — POTASSIUM CHLORIDE ER 10 MEQ PO TBCR: 10.0000 meq | EXTENDED_RELEASE_TABLET | Freq: Every day | ORAL | 5 refills | Status: DC

## 2024-06-11 NOTE — Progress Notes (Signed)
 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 These are consistent with metastases but she has Andrea had scans in 6 months and I think we need to reassess. A PET scan would be helpful to see if these lesions would light up and look for the status of her lung primary and assess any other metastatic disease. She declines a spine biopsy. She is having escalating pain and radicular neuropathy so we will start her on Gabapentin  300 mg HS. PET scan done on 03/11/2024 which revealed a 1.8 x 1.3cm nodular area of soft tissue density airspace consolidation, in the upper lobe of the right lung with a max SUV of 3.8 and 1.8 x 1.2cm area of soft tissue density airspace consolidation, in the middle lobe of the right lung with a max SUV of 9.4. No uptake was seen elsewhere and no skeletal lesions were found.  Possible Mass in the Upper Outer Quadrant of the Posterior Left Breast This measures 1.5 cm. this is partially obscured and indistinct and her breasts are heterogeneously dense. Examination is more consistent with fibrocystic changes and she refuses a biopsy at this time. We will plan to repeat a ultrasound in December 2025.   Dental Issues The stomatitis is probably related to the Tagrisso  but her receeding gums and teeth falling out is Andrea usually seen with this. She needs evaluation by a dentist and/or oral surgeon.   Tobacco Abuse She has cut back but continues to smoke 1 half pack per day.   Plan: Patient states that she feels poorly and complains of very low energy, hot flashes 3x daily, nausea, back pain, and muscle stiffness in her hands and legs. She states that her low energy is causing feelings of  depression. I suggested that she begin taking her Effexor  regularly as this can treat her hot flashes and her depression. Today, she inquired about trying Ivermectin and Fenbendazole alongside or instead of her Tagrisso . I reassured her that Tagrisso  is currently the best option and told her that we can consider alternative options if she stops responding to her current treatment. For now, she is responding well and has manageable side effects. She has Lorazepam  to use as needed for nausea and I offered to prescribe another antiemetic, but she declined. She has a WBC of 4.8, hemoglobin of 14.0, and a slightly low platelet count of 143,000 down from 159,000. Her CMP is normal other than a low potassium of 3.3 down from 3.9 and an elevated creatinine of 1.07 up from 0.86. I will prescribe 1 pill daily of 10 mEq potassium. I also provided her with a diet resource listing foods high in potassium. I will see her back in 3 months with CBC,CMP, CEA, CT chest abdomen, and pelvis, and left breast ultrasound. She has a iodinated contrast allergy so I will prescribe 3 doses of Prednisone  50 mg to be taken prior to her CT. Her ultrasound is scheduled for 08/17/2024 at [facility]. The patient was provided an opportunity to ask questions and all were answered. The patient understands all the plans discussed today and is in agreement with them.   I provided 34 minutes of face-to-face time during this  this encounter and > 50% was spent counseling as documented under my assessment and plan.   Honestii VEAR Cornish, MD [facility] OF [facility] [address] [place] [address] Dept: [phone] Dept Fax: [phone]   CHIEF COMPLAINT:  CC:  EGFR mutation positive Stage IVA (T4 N2 M1a) lung adenocarcinoma   Current Treatment:  Tagrisso    HISTORY OF PRESENT ILLNESS:  Andrea Holloway is a 48 y.o. female with stage IVA  (T4 N2 M1a) lung adenocarcinoma.  The stage IVA designation was based upon her initially having a malignant pleural effusion.  She had no evidence of distant metastasis.  As testing showed her to harbor  the EGFR mutation, she has been taking osimertinib  since July 2022.  She comes in today to go over there MRI of her thoracic spine, which was done due to her having increased back pain.  This pain has persisted over these past weeks.  She denies losing control of her bladder or bowel function.  The pain essentially does Andrea radiate.   She had a screening mammogram in December 2024 which revealed architectural distortion in the right breast which corresponds to the site of her biopsy scar.  However in the left breast there is an isodense mass and a diagnostic mammogram with ultrasound was requested.  She finally had the diagnostic mammogram on February 14, 2024 and her breasts are heterogeneously dense.  In the upper outer quadrant of the posterior left breast there is an obscured mass measuring 1.5 cm.  The ultrasound confirmed I have a hypoechoic mass with indistinct margins measuring 1.5 cm and located at 2:00, 12 cm from the nipple.  An ultrasound biopsy was recommended but the patient declined.  They therefore recommended a repeat left breast ultrasound in 6 months.  The patient does Andrea want to have any biopsies done at this time.  He also declines a biopsy of the thoracic spine.  I have reviewed her chart and materials related to her cancer extensively and collaborated history with the patient. Summary of oncologic history is as follows: Oncology History  Malignant neoplasm of upper lobe of right lung (HCC)  02/03/2021 Initial Diagnosis   Malignant neoplasm of upper lobe of right lung (HCC)   02/13/2021 - 02/13/2021 Chemotherapy         02/14/2021 Cancer Staging   Staging form: Lung, AJCC 8th Edition - Clinical stage from 02/14/2021: Stage IVA (cT4, cN2, cM1a) - Signed by Ezzard Valaria LABOR, MD on  12/27/2023 Histopathologic type: Adenocarcinoma, NOS Stage prefix: Initial diagnosis   02/20/2021 - 02/20/2021 Chemotherapy          INTERVAL HISTORY:  Lygia is seen in the clinic for follow up of her  EGFR mutation positive L747(exon19 deletion) stage IVA (T4 N2 M1a) lung adenocarcinoma. She was placed on Tagrisso  by July, 2022 and has had a good response. Patient states that she feels poorly and complains of very low energy, hot flashes 3x daily, nausea, back pain, and muscle stiffness in her hands and legs. She states that her low energy is causing feelings of depression. I suggested that she begin taking her Effexor  regularly as this can treat her hot flashes and her depression. Today, she inquired about trying Ivermectin and Fenbendazole alongside or instead of her Tagrisso . I reassured her that Tagrisso  is currently the best option and told her that we can consider alternative options if she stops responding to her current treatment.  For now, she is responding well and has manageable side effects. She has Lorazepam  to use as needed for nausea and I offered to prescribe another antiemetic, but she declined. She has a WBC of 4.8, hemoglobin of 14.0, and a slightly low platelet count of 143,000 down from 159,000. Her CMP is normal other than a low potassium of 3.3 down from 3.9 and an elevated creatinine of 1.07 up from 0.86. I will prescribe 1 pill daily of 10 mEq potassium. I also provided her with a diet resource listing foods high in potassium. I will see her back in 3 months with CBC,CMP, CEA, CT chest abdomen, and pelvis, and left breast ultrasound. She has a iodinated contrast allergy so I will prescribe 3 doses of Prednisone  50 mg to be taken prior to her CT. Her ultrasound is scheduled for 08/17/2024 at [facility]. She denies fever, chills, night sweats, or other signs of infection. She denies cardiorespiratory and gastrointestinal issues. She  denies pain. Her  appetite is better and Her weight decreased 1 pound in 3 months. She is accompanied by a relative.  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 lung (HCC) 02/03/2021   Palpitations    Seizures (HCC)     Past Surgical History:  Procedure Laterality Date   CARDIAC SURGERY     SAC OF FLUID BEHIND HEART @ 48 YEARS OF AGE   MYRINGOTOMY WITH TUBE PLACEMENT Bilateral    TUBAL LIGATION      Family History  Problem Relation Age of Onset   Hodgkin's lymphoma Sister 45   Thyroid  cancer Maternal Grandmother 26   Prostate cancer Maternal Grandfather 16   Penile cancer Maternal Grandfather 43   Breast cancer Maternal Aunt 40   Colon cancer Cousin        dx unknown age   Colon cancer Cousin        dx unknown age   Colon cancer Cousin        dx unknown age   Lung cancer Cousin 45   Brain cancer Cousin     Social History:  reports that she has been smoking cigarettes. She has never used smokeless tobacco. She reports that she does Andrea currently use alcohol. She reports current drug use. Drug: Marijuana.  Allergies:  Allergies  Allergen Reactions   Iodinated Contrast Media Hives   Iodine     Topical iodine -does fine with Iodinated contrast- was given Isovue  300 on 08/01/2021 without complications    Current Medications: Current Outpatient Medications  Medication Sig Dispense Refill   diazepam  (VALIUM ) 5 MG tablet Take 1 tablet (5 mg total) by mouth as directed for 1 dose. Take 30 minutes before MRI. 1 tablet 0   gabapentin  (NEURONTIN ) 300 MG capsule Take 1 capsule (300 mg total) by mouth at bedtime. 30 capsule 5   osimertinib  mesylate (TAGRISSO ) 80 MG tablet Take 1 tablet (80 mg total) by mouth daily. 30 tablet 5   Oxycodone  HCl 10 MG  TABS One tab po q4-6hr prn pain 40 tablet 0   potassium chloride (KLOR-CON) 10 MEQ tablet Take 1 tablet (10 mEq total) by mouth daily. 30 tablet 5  venlafaxine  (EFFEXOR ) 37.5 MG tablet Take 1 tablet (37.5 mg total) by mouth daily. (Patient Andrea taking: Reported on 06/11/2024) 30 tablet 5   No current facility-administered medications for this visit.   REVIEW OF SYSTEMS:  Review of Systems  Constitutional:  Positive for appetite change (better) and fatigue. Negative for chills, diaphoresis, fever and unexpected weight change.  HENT:   Positive for mouth sores. Negative for hearing loss, lump/mass, nosebleeds, sore throat, tinnitus, trouble swallowing and voice change.        Dental issues (teeth pain and tooth loss)  Eyes: Negative.  Negative for eye problems and icterus.  Respiratory:  Positive for cough. Negative for chest tightness, hemoptysis, shortness of breath and wheezing.   Cardiovascular: Negative.  Negative for chest pain, leg swelling and palpitations.  Gastrointestinal:  Positive for nausea (occasional). Negative for abdominal distention, abdominal pain, blood in stool, constipation, diarrhea, rectal pain and vomiting.  Endocrine: Positive for hot flashes (2-3x daily).  Genitourinary: Negative.  Negative for bladder incontinence, difficulty urinating, dyspareunia, dysuria, frequency, hematuria, menstrual problem, nocturia, pelvic pain, vaginal bleeding and vaginal discharge.   Musculoskeletal:  Positive for back pain. Negative for arthralgias, flank pain, gait problem, myalgias, neck pain and neck stiffness.  Skin: Negative.  Negative for itching, rash and wound.  Neurological:  Negative for dizziness, extremity weakness, gait problem, headaches, light-headedness, numbness, seizures and speech difficulty.  Hematological: Negative.  Negative for adenopathy. Does Andrea bruise/bleed easily.  Psychiatric/Behavioral:  Positive for depression. Negative for confusion, decreased concentration,  sleep disturbance and suicidal ideas. The patient is Andrea nervous/anxious.     VITALS:  Blood pressure 126/75, pulse 88, temperature 97.8 F (36.6 C), temperature source Oral, resp. rate 16, height 5' 4 (1.626 m), weight 139 lb 6.4 oz (63.2 kg), last menstrual period 12/04/2020, SpO2 97%.  Wt Readings from Last 3 Encounters:  06/11/24 139 lb 6.4 oz (63.2 kg)  03/19/24 140 lb 9.6 oz (63.8 kg)  02/28/24 140 lb 6.4 oz (63.7 kg)  Body mass index is 23.93 kg/m.  Performance status (ECOG): 1 - Symptomatic but completely ambulatory  PHYSICAL EXAM:  Physical Exam Vitals and nursing note reviewed. Exam conducted with a chaperone present.  Constitutional:      General: She is Andrea in acute distress.    Appearance: Normal appearance. She is normal weight. She is Andrea ill-appearing, toxic-appearing or diaphoretic.  HENT:     Head: Normocephalic and atraumatic.     Right Ear: Tympanic membrane, ear canal and external ear normal. There is no impacted cerumen.     Left Ear: Tympanic membrane, ear canal and external ear normal. There is no impacted cerumen.     Nose: Nose normal. No congestion or rhinorrhea.     Mouth/Throat:     Mouth: Mucous membranes are moist.     Pharynx: Oropharynx is clear. No oropharyngeal exudate or posterior oropharyngeal erythema.  Eyes:     General: No scleral icterus.       Right eye: No discharge.        Left eye: No discharge.     Extraocular Movements: Extraocular movements intact.     Conjunctiva/sclera: Conjunctivae normal.     Pupils: Pupils are equal, round, and reactive to light.  Neck:     Vascular: No carotid bruit.  Cardiovascular:     Rate and Rhythm: Normal rate and regular rhythm.     Pulses: Normal pulses.     Heart sounds: Normal heart sounds. No murmur heard.  No friction rub. No gallop.  Pulmonary:     Effort: Pulmonary effort is normal. No respiratory distress.     Breath sounds: No stridor. Examination of the right-lower field reveals  wheezing. Wheezing present. No rhonchi or rales.  Chest:     Chest wall: No tenderness.     Comments: A small, seemingly benign skin tag/lesion is 3-4 cm medial to her left breast Well healed scar in the far upper right chest  Abdominal:     General: Bowel sounds are normal. There is no distension.     Palpations: Abdomen is soft. There is no hepatomegaly, splenomegaly or mass.     Tenderness: There is no abdominal tenderness. There is no right CVA tenderness, left CVA tenderness, guarding or rebound.     Hernia: No hernia is present.  Musculoskeletal:        General: No swelling, tenderness, deformity or signs of injury. Normal range of motion.     Cervical back: Normal range of motion and neck supple. No rigidity or tenderness.     Right lower leg: No edema.     Left lower leg: No edema.  Lymphadenopathy:     Cervical: No cervical adenopathy.     Right cervical: No superficial, deep or posterior cervical adenopathy.    Left cervical: No superficial, deep or posterior cervical adenopathy.     Upper Body:     Right upper body: No supraclavicular, axillary or pectoral adenopathy.     Left upper body: No supraclavicular, axillary or pectoral adenopathy.  Skin:    General: Skin is warm and dry.     Coloration: Skin is Andrea jaundiced or pale.     Findings: No bruising, erythema, lesion or rash.  Neurological:     General: No focal deficit present.     Mental Status: She is alert and oriented to person, place, and time. Mental status is at baseline.     Cranial Nerves: No cranial nerve deficit.     Sensory: No sensory deficit.     Motor: No weakness.     Coordination: Coordination normal.     Gait: Gait normal.     Deep Tendon Reflexes: Reflexes normal.  Psychiatric:        Mood and Affect: Mood normal.        Behavior: Behavior normal.        Thought Content: Thought content normal.        Judgment: Judgment normal.    LABS:      Latest Ref Rng & Units 06/11/2024    3:19 PM  02/28/2024   10:16 AM 03/20/2023   12:00 AM  CBC  WBC 4.0 - 10.5 K/uL 4.8  5.4  5.7      Hemoglobin 12.0 - 15.0 g/dL 85.9  85.4  85.9      Hematocrit 36.0 - 46.0 % 40.2  42.5  40      Platelets 150 - 400 K/uL 143  159  145         This result is from an external source.      Latest Ref Rng & Units 06/11/2024    3:19 PM 02/28/2024   10:16 AM 03/20/2023   12:00 AM  CMP  Glucose 70 - 99 mg/dL 98  97    BUN 6 - 20 mg/dL 9  9  9       Creatinine 0.44 - 1.00 mg/dL 8.92  9.13  0.8      Sodium 135 -  145 mmol/L 139  138  137      Potassium 3.5 - 5.1 mmol/L 3.3  3.9  3.7      Chloride 98 - 111 mmol/L 103  103  103      CO2 22 - 32 mmol/L 25  25  23       Calcium 8.9 - 10.3 mg/dL 9.5  9.8  9.2      Total Protein 6.5 - 8.1 g/dL 6.9  7.2    Total Bilirubin 0.0 - 1.2 mg/dL 0.4  0.5    Alkaline Phos 38 - 126 U/L 83  89  66      AST 15 - 41 U/L 17  20  27       ALT 0 - 44 U/L 5  8  13          This result is from an external source.   Lab Results  Component Value Date   CEA 2.82 02/28/2024   /  CEA ([facility])  Date Value Ref Range Status  02/28/2024 2.82 0.00 - 5.00 ng/mL Final    Comment:    (NOTE) This test was performed using Beckman Coulter's paramagnetic chemiluminescent immunoassay. Values obtained from different assay methods cannot be used interchangeably. Please note that up to 8% of patients who smoke may see values 5.1-10.0 ng/ml and 1% of patients who smoke may see CEA levels >10.0 ng/ml. Performed at [facility], [address], [place], [place] [address]    No results found for: PSA1 No results found for: RJW800 No results found for: CAN125  No results found for: TOTALPROTELP, ALBUMINELP, A1GS, A2GS, BETS, BETA2SER, GAMS, MSPIKE, SPEI No results found for: TIBC, FERRITIN, IRONPCTSAT No results found for: LDH  STUDIES:  Exam: 03/11/2024 PET Skull Base to Mid Thigh Impression: There are 2 nodules in the upper lobe of  the right lung demonstrating hypermetabolic activity consistent with bronchogenic carcinoma.  1.8 x 1.3cm nodular area of soft tissue density airspace consolidation, in the upper lobe of the right lung, demonstrating FDG activity with a max SUV of 3.8.  1.8 x 1.2cm area of soft tissue density airspace consolidation, in the middle lobe of the right lung, demonstrating FDG activity with a max SUV of 9.4.      I,Thane Age H Anaja Monts,acting as a scribe for Zoi VEAR Cornish, MD.,have documented all relevant documentation on the behalf of Stehanie VEAR Cornish, MD,as directed by  Chessa VEAR Cornish, MD while in the presence of Thuy VEAR Cornish, MD.  I have reviewed this report as typed by the medical scribe, and it is complete and accurate

## 2024-06-16 ENCOUNTER — Ambulatory Visit: Admitting: Oncology

## 2024-06-16 ENCOUNTER — Other Ambulatory Visit

## 2024-06-24 ENCOUNTER — Other Ambulatory Visit: Payer: Self-pay | Admitting: Oncology

## 2024-06-24 ENCOUNTER — Encounter: Payer: Self-pay | Admitting: Oncology

## 2024-06-24 DIAGNOSIS — C3411 Malignant neoplasm of upper lobe, right bronchus or lung: Secondary | ICD-10-CM

## 2024-06-25 ENCOUNTER — Telehealth: Payer: Self-pay

## 2024-06-26 ENCOUNTER — Other Ambulatory Visit: Payer: Self-pay | Admitting: Hematology and Oncology

## 2024-06-26 DIAGNOSIS — C3411 Malignant neoplasm of upper lobe, right bronchus or lung: Secondary | ICD-10-CM

## 2024-06-26 DIAGNOSIS — H539 Unspecified visual disturbance: Secondary | ICD-10-CM

## 2024-06-26 DIAGNOSIS — R4689 Other symptoms and signs involving appearance and behavior: Secondary | ICD-10-CM

## 2024-06-26 DIAGNOSIS — R519 Headache, unspecified: Secondary | ICD-10-CM

## 2024-06-26 NOTE — Telephone Encounter (Signed)
 Pt has returned call this morning. She states her son did not give her message that I had attempted call to her yesterday several times. Pt states, I'm having severe issues. I and my family have noticed a decrease in my mentality. I asked her to explain what she means to me. She replied, It's depression, and anxiety that I have had all my life, it's just worse. I'm very angry and confrontational, which is completely different than what I usually have been. I've always been soft spoken and laid back. I asked if pt had seen her PCP recently? If she had ever seen a psychiatrist or psychologist for counseling in past? She replied, Yes, a psychiatrist about 20 yrs ago. I had a terrible childhood. Pt denies being on any type of anxiety or depression meds. She states she never picked up the Effexor  she was prescribed for hot flashes a while back either. (Also, mentions she didn't pick up last prescription for potassium, as she didn't have way up to get it). Above message sent to Kelli Mosher,PA and Eleanor Harl PIETY, as Dr Cornelius out of clinic. Next schedule appt is 09/11/2023. Pt is on Tagrisso .  Melissa,NP: I wouldn't think that Tagrisso  would be the cause this far out. She's been on it for a few years now, right? I can certainly see her next week. I thought she saw [facility]: Aleeyah Bensen, would you make sure she is taking the potassium and not taking any alternative meds or other new meds we are unaware of? Either Melissa or I could see her Monday repeat labs and plan to scan head.  Nadiyah Abdul,LCSW: Most psych docs are going to want medical changes ruled out. I am happy to make contact with her in between time.

## 2024-06-29 ENCOUNTER — Other Ambulatory Visit (HOSPITAL_BASED_OUTPATIENT_CLINIC_OR_DEPARTMENT_OTHER): Admitting: Radiology

## 2024-06-29 ENCOUNTER — Other Ambulatory Visit: Payer: Self-pay

## 2024-06-29 ENCOUNTER — Inpatient Hospital Stay

## 2024-06-29 ENCOUNTER — Telehealth: Payer: Self-pay

## 2024-06-29 ENCOUNTER — Inpatient Hospital Stay (HOSPITAL_BASED_OUTPATIENT_CLINIC_OR_DEPARTMENT_OTHER): Admitting: Hematology and Oncology

## 2024-06-29 VITALS — BP 134/59 | HR 77 | Temp 98.9°F | Resp 18 | Ht 64.0 in | Wt 139.2 lb

## 2024-06-29 DIAGNOSIS — R4689 Other symptoms and signs involving appearance and behavior: Secondary | ICD-10-CM

## 2024-06-29 DIAGNOSIS — C3411 Malignant neoplasm of upper lobe, right bronchus or lung: Secondary | ICD-10-CM

## 2024-06-29 LAB — CBC WITH DIFFERENTIAL (CANCER CENTER ONLY)
Abs Immature Granulocytes: 0.01 K/uL (ref 0.00–0.07)
Basophils Absolute: 0 K/uL (ref 0.0–0.1)
Basophils Relative: 0 %
Eosinophils Absolute: 0 K/uL (ref 0.0–0.5)
Eosinophils Relative: 0 %
HCT: 40.7 % (ref 36.0–46.0)
Hemoglobin: 14.3 g/dL (ref 12.0–15.0)
Immature Granulocytes: 0 %
Lymphocytes Relative: 31 %
Lymphs Abs: 1.9 K/uL (ref 0.7–4.0)
MCH: 32.7 pg (ref 26.0–34.0)
MCHC: 35.1 g/dL (ref 30.0–36.0)
MCV: 93.1 fL (ref 80.0–100.0)
Monocytes Absolute: 0.4 K/uL (ref 0.1–1.0)
Monocytes Relative: 7 %
Neutro Abs: 3.7 K/uL (ref 1.7–7.7)
Neutrophils Relative %: 62 %
Platelet Count: 155 K/uL (ref 150–400)
RBC: 4.37 MIL/uL (ref 3.87–5.11)
RDW: 12.3 % (ref 11.5–15.5)
WBC Count: 6 K/uL (ref 4.0–10.5)
nRBC: 0 % (ref 0.0–0.2)

## 2024-06-29 LAB — AMMONIA: Ammonia: <13 umol/L (ref 9–35)

## 2024-06-29 LAB — CMP (CANCER CENTER ONLY)
ALT: 11 U/L (ref 0–44)
AST: 22 U/L (ref 15–41)
Albumin: 4.7 g/dL (ref 3.5–5.0)
Alkaline Phosphatase: 89 U/L (ref 38–126)
Anion gap: 12 (ref 5–15)
BUN: 9 mg/dL (ref 6–20)
CO2: 25 mmol/L (ref 22–32)
Calcium: 9.8 mg/dL (ref 8.9–10.3)
Chloride: 102 mmol/L (ref 98–111)
Creatinine: 0.91 mg/dL (ref 0.44–1.00)
GFR, Estimated: >60 mL/min (ref >60)
Glucose, Bld: 92 mg/dL (ref 70–99)
Potassium: 4 mmol/L (ref 3.5–5.1)
Sodium: 139 mmol/L (ref 135–145)
Total Bilirubin: 0.4 mg/dL (ref 0.0–1.2)
Total Protein: 7.1 g/dL (ref 6.5–8.1)

## 2024-06-29 LAB — URINALYSIS, COMPLETE (UACMP) WITH MICROSCOPIC
Bilirubin Urine: NEGATIVE
Glucose, UA: NEGATIVE mg/dL
Hgb urine dipstick: NEGATIVE
Ketones, ur: NEGATIVE mg/dL
Leukocytes,Ua: NEGATIVE
Nitrite: NEGATIVE
Protein, ur: NEGATIVE mg/dL
RBC / HPF: NONE SEEN RBC/hpf (ref 0–5)
Specific Gravity, Urine: 1.01 (ref 1.005–1.030)
pH: 6 (ref 5.0–8.0)

## 2024-06-29 LAB — VITAMIN B12: Vitamin B-12: 358 pg/mL (ref 180–914)

## 2024-06-29 LAB — TSH: TSH: 1.63 u[IU]/mL (ref 0.350–4.500)

## 2024-06-29 NOTE — Progress Notes (Signed)
 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 These are consistent with metastases but she has not had scans in 6 months and I think we need to reassess. A PET scan would be helpful to see if these lesions would light up and look for the status of her lung primary and assess any other metastatic disease. She declines a spine biopsy. She is having escalating pain and radicular neuropathy so we will start her on Gabapentin  300 mg HS. PET scan done on 03/11/2024 which revealed a 1.8 x 1.3cm nodular area of soft tissue density airspace consolidation, in the upper lobe of the right lung with a max SUV of 3.8 and 1.8 x 1.2cm area of soft tissue density airspace consolidation, in the middle lobe of the right lung with a max SUV of 9.4. No uptake was seen elsewhere and no skeletal lesions were found.  Possible Mass in the Upper Outer Quadrant of the Posterior Left Breast This measures 1.5 cm. this is partially obscured and indistinct and her breasts are heterogeneously dense. Examination is more consistent with fibrocystic changes and she refuses a biopsy at this time. We will plan to repeat a ultrasound in December 2025.   Dental Issues The stomatitis is probably related to the Tagrisso  but her receeding gums and teeth falling out is not usually seen with this. She needs evaluation by a dentist and/or oral surgeon.   Tobacco Abuse She has cut back but continues to smoke 1 half pack per day.   Plan: Patient states that she feels poorly and complains of very low energy, hot flashes 3x daily, nausea, back pain, and muscle stiffness in her hands and legs. She states that her low energy is causing feelings of depression.  She complains of changes in mental status including confusion. She also notes severe depression, irritation and anger and is concerned that her treatment has stopped working. She is interested in alternative therapy and would like to stop her Tagrisso . I advised that we should evaluate imaging of the brain due to her cognitive concerns. She refuses MRI due to severe anxiety and refuses CT imaging with contrast due to previous reaction. We will attempt CT of the head without contrast and she will return to clinic for further discussion with Dr. Cornelius regarding her treatment plan.   I provided 30 minutes of face-to-face time during this this encounter and > 50% was spent counseling as documented under my assessment and plan.   Andrea DELENA Bach, NP [facility] OF [facility] [address] [place] [address] Dept: [phone] Dept Fax: [phone]   CHIEF COMPLAINT:  CC:  EGFR mutation positive Stage IVA (T4 N2 M1a) lung adenocarcinoma   Current Treatment:  Tagrisso    HISTORY OF PRESENT ILLNESS:  Andrea Holloway is a 48 y.o. female with stage IVA (T4 N2 M1a) lung adenocarcinoma.  The stage IVA designation was based upon her initially having a malignant pleural effusion.  She had no evidence of distant metastasis.  As testing showed her to harbor  the EGFR mutation, she has been taking osimertinib  since July 2022.  She comes in today to go over  there MRI of her thoracic spine, which was done due to her having increased back pain.  This pain has persisted over these past weeks.  She denies losing control of her bladder or bowel function.  The pain essentially does not radiate.   She had a screening mammogram in December 2024 which revealed architectural distortion in the right breast which corresponds to the site of her biopsy scar.  However in the left breast there is an isodense mass and a diagnostic mammogram with ultrasound  was requested.  She finally had the diagnostic mammogram on February 14, 2024 and her breasts are heterogeneously dense.  In the upper outer quadrant of the posterior left breast there is an obscured mass measuring 1.5 cm.  The ultrasound confirmed I have a hypoechoic mass with indistinct margins measuring 1.5 cm and located at 2:00, 12 cm from the nipple.  An ultrasound biopsy was recommended but the patient declined.  They therefore recommended a repeat left breast ultrasound in 6 months.  The patient does not want to have any biopsies done at this time.  He also declines a biopsy of the thoracic spine.  I have reviewed her chart and materials related to her cancer extensively and collaborated history with the patient. Summary of oncologic history is as follows: Oncology History  Malignant neoplasm of upper lobe of right lung (HCC)  02/03/2021 Initial Diagnosis   Malignant neoplasm of upper lobe of right lung (HCC)   02/13/2021 - 02/13/2021 Chemotherapy         02/14/2021 Cancer Staging   Staging form: Lung, AJCC 8th Edition - Clinical stage from 02/14/2021: Stage IVA (cT4, cN2, cM1a) - Signed by Ezzard Valaria LABOR, MD on 12/27/2023 Histopathologic type: Adenocarcinoma, NOS Stage prefix: Initial diagnosis   02/20/2021 - 02/20/2021 Chemotherapy          INTERVAL HISTORY:  Andrea Holloway is seen in the clinic for follow up of her  EGFR mutation positive L747(exon19 deletion) stage IVA (T4 N2 M1a) lung adenocarcinoma. She was placed on Tagrisso  by July, 2022 and has had a good response. Patient states that she feels poorly and complains of very low energy, hot flashes 3x daily, nausea, back pain, and muscle stiffness in her hands and legs. She states that her low energy is causing feelings of depression. I suggested that she begin taking her Effexor  regularly as this can treat her hot flashes and her depression. Today, she inquired about trying Ivermectin and Fenbendazole alongside or instead of her Tagrisso . I  reassured her that Tagrisso  is currently the best option and told her that we can consider alternative options if she stops responding to her current treatment. For now, she is responding well and has manageable side effects. She has Lorazepam  to use as needed for nausea and I offered to prescribe another antiemetic, but she declined. She has a WBC of 4.8, hemoglobin of 14.0, and a slightly low platelet count of 143,000 down from 159,000. Her CMP is normal other than a low potassium of 3.3 down from 3.9 and an elevated creatinine of 1.07 up from 0.86. I will prescribe 1 pill daily of 10 mEq potassium. I also provided her with a diet resource listing foods high in potassium. I will see her back in 3 months with CBC,CMP, CEA, CT chest abdomen, and pelvis, and left breast ultrasound. She has a iodinated contrast allergy so I will prescribe 3 doses of Prednisone  50 mg to be taken prior to her CT. Her ultrasound is scheduled for  08/17/2024 at [facility]. She denies fever, chills, night sweats, or other signs of infection. She denies cardiorespiratory and gastrointestinal issues. She  denies pain. Her appetite is better and Her weight decreased 1 pound in 3 months. She is accompanied by a relative.  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 lung (HCC) 02/03/2021   Palpitations    Seizures (HCC)     Past Surgical History:  Procedure Laterality Date   CARDIAC SURGERY     SAC OF FLUID BEHIND HEART @ 48 YEARS OF AGE   MYRINGOTOMY WITH TUBE PLACEMENT Bilateral    TUBAL LIGATION      Family History  Problem Relation Age of Onset   Hodgkin's lymphoma Sister 43   Thyroid  cancer Maternal Grandmother 22   Prostate cancer Maternal  Grandfather 57   Penile cancer Maternal Grandfather 35   Breast cancer Maternal Aunt 40   Colon cancer Cousin        dx unknown age   Colon cancer Cousin        dx unknown age   Colon cancer Cousin        dx unknown age   Lung cancer Cousin 23   Brain cancer Cousin     Social History:  reports that she has been smoking cigarettes. She has never used smokeless tobacco. She reports that she does not currently use alcohol. She reports current drug use. Drug: Marijuana.  Allergies:  Allergies  Allergen Reactions   Iodinated Contrast Media Hives   Iodine     Topical iodine -does fine with Iodinated contrast- was given Isovue  300 on 08/01/2021 without complications    Current Medications: Current Outpatient Medications  Medication Sig Dispense Refill   osimertinib  mesylate (TAGRISSO ) 80 MG tablet Take 1 tablet (80 mg total) by mouth daily. 30 tablet 5   No current facility-administered medications for this visit.   REVIEW OF SYSTEMS:  Review of Systems  Constitutional:  Positive for appetite change (better) and fatigue. Negative for chills, diaphoresis, fever and unexpected weight change.  HENT:   Positive for mouth sores. Negative for hearing loss, lump/mass, nosebleeds, sore throat, tinnitus, trouble swallowing and voice change.        Dental issues (teeth pain and tooth loss)  Eyes: Negative.  Negative for eye problems and icterus.  Respiratory:  Positive for cough. Negative for chest tightness, hemoptysis, shortness of breath and wheezing.   Cardiovascular: Negative.  Negative for chest pain, leg swelling and palpitations.  Gastrointestinal:  Positive for nausea (occasional). Negative for abdominal distention, abdominal pain, blood in stool, constipation, diarrhea, rectal pain and vomiting.  Endocrine: Positive for hot flashes (2-3x daily).  Genitourinary: Negative.  Negative for bladder incontinence, difficulty urinating, dyspareunia, dysuria, frequency, hematuria, menstrual  problem, nocturia, pelvic pain, vaginal bleeding and vaginal discharge.   Musculoskeletal:  Positive for back pain. Negative for arthralgias, flank pain, gait problem, myalgias, neck pain and neck stiffness.  Skin: Negative.  Negative for itching, rash and wound.  Neurological:  Negative for dizziness, extremity weakness, gait problem, headaches, light-headedness, numbness, seizures and speech difficulty.  Hematological: Negative.  Negative for adenopathy. Does not bruise/bleed easily.  Psychiatric/Behavioral:  Positive  for depression. Negative for confusion, decreased concentration, sleep disturbance and suicidal ideas. The patient is not nervous/anxious.     VITALS:  Last menstrual period 12/04/2020.  Wt Readings from Last 3 Encounters:  06/11/24 139 lb 6.4 oz (63.2 kg)  03/19/24 140 lb 9.6 oz (63.8 kg)  02/28/24 140 lb 6.4 oz (63.7 kg)  There is no height or weight on file to calculate BMI.  Performance status (ECOG): 1 - Symptomatic but completely ambulatory  PHYSICAL EXAM:  Physical Exam Vitals and nursing note reviewed. Exam conducted with a chaperone present.  Constitutional:      General: She is not in acute distress.    Appearance: Normal appearance. She is normal weight. She is not ill-appearing, toxic-appearing or diaphoretic.  HENT:     Head: Normocephalic and atraumatic.     Right Ear: Tympanic membrane, ear canal and external ear normal. There is no impacted cerumen.     Left Ear: Tympanic membrane, ear canal and external ear normal. There is no impacted cerumen.     Nose: Nose normal. No congestion or rhinorrhea.     Mouth/Throat:     Mouth: Mucous membranes are moist.     Pharynx: Oropharynx is clear. No oropharyngeal exudate or posterior oropharyngeal erythema.  Eyes:     General: No scleral icterus.       Right eye: No discharge.        Left eye: No discharge.     Extraocular Movements: Extraocular movements intact.     Conjunctiva/sclera: Conjunctivae normal.      Pupils: Pupils are equal, round, and reactive to light.  Neck:     Vascular: No carotid bruit.  Cardiovascular:     Rate and Rhythm: Normal rate and regular rhythm.     Pulses: Normal pulses.     Heart sounds: Normal heart sounds. No murmur heard.    No friction rub. No gallop.  Pulmonary:     Effort: Pulmonary effort is normal. No respiratory distress.     Breath sounds: No stridor. Examination of the right-lower field reveals wheezing. Wheezing present. No rhonchi or rales.  Chest:     Chest wall: No tenderness.     Comments: A small, seemingly benign skin tag/lesion is 3-4 cm medial to her left breast Well healed scar in the far upper right chest  Abdominal:     General: Bowel sounds are normal. There is no distension.     Palpations: Abdomen is soft. There is no hepatomegaly, splenomegaly or mass.     Tenderness: There is no abdominal tenderness. There is no right CVA tenderness, left CVA tenderness, guarding or rebound.     Hernia: No hernia is present.  Musculoskeletal:        General: No swelling, tenderness, deformity or signs of injury. Normal range of motion.     Cervical back: Normal range of motion and neck supple. No rigidity or tenderness.     Right lower leg: No edema.     Left lower leg: No edema.  Lymphadenopathy:     Cervical: No cervical adenopathy.     Right cervical: No superficial, deep or posterior cervical adenopathy.    Left cervical: No superficial, deep or posterior cervical adenopathy.     Upper Body:     Right upper body: No supraclavicular, axillary or pectoral adenopathy.     Left upper body: No supraclavicular, axillary or pectoral adenopathy.  Skin:    General: Skin is warm and dry.  Coloration: Skin is not jaundiced or pale.     Findings: No bruising, erythema, lesion or rash.  Neurological:     General: No focal deficit present.     Mental Status: She is alert and oriented to person, place, and time. Mental status is at baseline.      Cranial Nerves: No cranial nerve deficit.     Sensory: No sensory deficit.     Motor: No weakness.     Coordination: Coordination normal.     Gait: Gait normal.     Deep Tendon Reflexes: Reflexes normal.  Psychiatric:        Mood and Affect: Mood normal.        Behavior: Behavior normal.        Thought Content: Thought content normal.        Judgment: Judgment normal.    LABS:      Latest Ref Rng & Units 06/29/2024   12:36 PM 06/11/2024    3:19 PM 02/28/2024   10:16 AM  CBC  WBC 4.0 - 10.5 K/uL 6.0  4.8  5.4   Hemoglobin 12.0 - 15.0 g/dL 85.6  85.9  85.4   Hematocrit 36.0 - 46.0 % 40.7  40.2  42.5   Platelets 150 - 400 K/uL 155  143  159       Latest Ref Rng & Units 06/11/2024    3:19 PM 02/28/2024   10:16 AM 03/20/2023   12:00 AM  CMP  Glucose 70 - 99 mg/dL 98  97    BUN 6 - 20 mg/dL 9  9  9       Creatinine 0.44 - 1.00 mg/dL 8.92  9.13  0.8      Sodium 135 - 145 mmol/L 139  138  137      Potassium 3.5 - 5.1 mmol/L 3.3  3.9  3.7      Chloride 98 - 111 mmol/L 103  103  103      CO2 22 - 32 mmol/L 25  25  23       Calcium 8.9 - 10.3 mg/dL 9.5  9.8  9.2      Total Protein 6.5 - 8.1 g/dL 6.9  7.2    Total Bilirubin 0.0 - 1.2 mg/dL 0.4  0.5    Alkaline Phos 38 - 126 U/L 83  89  66      AST 15 - 41 U/L 17  20  27       ALT 0 - 44 U/L 5  8  13          This result is from an external source.   Lab Results  Component Value Date   CEA 2.82 02/28/2024   /  CEA ([facility])  Date Value Ref Range Status  02/28/2024 2.82 0.00 - 5.00 ng/mL Final    Comment:    (NOTE) This test was performed using Beckman Coulter's paramagnetic chemiluminescent immunoassay. Values obtained from different assay methods cannot be used interchangeably. Please note that up to 8% of patients who smoke may see values 5.1-10.0 ng/ml and 1% of patients who smoke may see CEA levels >10.0 ng/ml. Performed at [facility], [address], [place], [place] [address]    No results found  for: PSA1 No results found for: RJW800 No results found for: CAN125  No results found for: TOTALPROTELP, ALBUMINELP, A1GS, A2GS, BETS, BETA2SER, GAMS, MSPIKE, SPEI No results found for: TIBC, FERRITIN, IRONPCTSAT No results found for: LDH  STUDIES:  Exam: 03/11/2024  PET Skull Base to Mid Thigh Impression: There are 2 nodules in the upper lobe of the right lung demonstrating hypermetabolic activity consistent with bronchogenic carcinoma.  1.8 x 1.3cm nodular area of soft tissue density airspace consolidation, in the upper lobe of the right lung, demonstrating FDG activity with a max SUV of 3.8.  1.8 x 1.2cm area of soft tissue density airspace consolidation, in the middle lobe of the right lung, demonstrating FDG activity with a max SUV of 9.4.      I,[place] Greb A Owais Pruett,acting as a scribe for [employer], NP.,have documented all relevant documentation on the behalf of Andrea DELENA Bach, NP,as directed by  Andrea DELENA Bach, NP while in the presence of Andrea DELENA Bach, NP.  I have reviewed this report as typed by the medical scribe, and it is complete and accurate

## 2024-06-30 ENCOUNTER — Other Ambulatory Visit (HOSPITAL_BASED_OUTPATIENT_CLINIC_OR_DEPARTMENT_OTHER): Admitting: Radiology

## 2024-06-30 ENCOUNTER — Telehealth: Payer: Self-pay | Admitting: Hematology and Oncology

## 2024-06-30 NOTE — Telephone Encounter (Signed)
 Opened in error

## 2024-06-30 NOTE — Telephone Encounter (Signed)
 Pt lvm requesting to R/S her scheduled CT Scan that is down for today due to having a migraine.  I have sent a msg to [facility] requesting they call her to reschedule.

## 2024-07-01 ENCOUNTER — Other Ambulatory Visit: Payer: Self-pay | Admitting: Hematology and Oncology

## 2024-07-01 ENCOUNTER — Telehealth: Payer: Self-pay

## 2024-07-01 LAB — URINE CULTURE: Culture: 80000 — AB

## 2024-07-01 MED ORDER — CIPROFLOXACIN HCL 500 MG PO TABS: 500.0000 mg | ORAL_TABLET | Freq: Two times a day (BID) | ORAL | 0 refills | Status: AC

## 2024-07-01 NOTE — Telephone Encounter (Signed)
 Left detailed message on home number that she has a UTI and Cipro has been sent to her pharmacy.

## 2024-07-01 NOTE — Telephone Encounter (Signed)
-----   Message from Andrez DELENA Foy sent at 07/01/2024  8:39 AM EDT ----- Please let her know her urine culture was positive. I have sent in cipro for 7 days. Thanks

## 2024-07-03 ENCOUNTER — Ambulatory Visit (HOSPITAL_BASED_OUTPATIENT_CLINIC_OR_DEPARTMENT_OTHER)
Admission: RE | Admit: 2024-07-03 | Discharge: 2024-07-03 | Disposition: A | Discharge status: Home / Self Care | Source: Ambulatory Visit | Attending: Hematology and Oncology | Admitting: Hematology and Oncology

## 2024-07-03 ENCOUNTER — Other Ambulatory Visit: Payer: Self-pay

## 2024-07-03 DIAGNOSIS — C3411 Malignant neoplasm of upper lobe, right bronchus or lung: Secondary | ICD-10-CM | POA: Diagnosis not present

## 2024-07-07 ENCOUNTER — Other Ambulatory Visit: Payer: Self-pay

## 2024-07-07 ENCOUNTER — Other Ambulatory Visit (HOSPITAL_COMMUNITY): Payer: Self-pay

## 2024-07-07 ENCOUNTER — Other Ambulatory Visit: Payer: Self-pay | Admitting: Hematology and Oncology

## 2024-07-07 ENCOUNTER — Telehealth: Payer: Self-pay | Admitting: Hematology and Oncology

## 2024-07-07 MED ORDER — NITROFURANTOIN MONOHYD MACRO 100 MG PO CAPS: 100.0000 mg | ORAL_CAPSULE | Freq: Two times a day (BID) | ORAL | 0 refills | Status: AC

## 2024-07-07 NOTE — Progress Notes (Signed)
 Specialty Pharmacy Refill Coordination Note  Spoke with Deloma Spindle is a 48 y.o. female contacted today regarding refills of specialty medication(s) Osimertinib  Mesylate (TAGRISSO )  Doses on hand: 4  Patient requested: Delivery   Delivery date: 07/09/24   Verified address: [address] [place] [address]  Medication will be filled on 07/08/24 .

## 2024-07-07 NOTE — Progress Notes (Signed)
 Clinical Intervention Note  Clinical Intervention Notes: Patient reported ringing in the ears and vision changes. Oncology is aware and is awaiting head CT results before making any changes to her medications.   Clinical Intervention Outcomes: Prevention of an adverse drug event   [employer]vertising Account Planner

## 2024-07-07 NOTE — Telephone Encounter (Signed)
 Contacted pt to schedule an appt fu with Dr. Cornelius. Unable to reach via phone, voicemail was left.

## 2024-07-08 NOTE — Telephone Encounter (Signed)
 Patient has been scheduled. Aware of appt date and time.

## 2024-07-09 ENCOUNTER — Telehealth: Payer: Self-pay | Admitting: Oncology

## 2024-07-09 ENCOUNTER — Inpatient Hospital Stay: Admitting: Oncology

## 2024-07-09 NOTE — Telephone Encounter (Signed)
 07/09/24 Patient rescheduled appt due to no transportation.

## 2024-07-10 ENCOUNTER — Telehealth: Payer: Self-pay | Admitting: Oncology

## 2024-07-10 ENCOUNTER — Inpatient Hospital Stay: Attending: Oncology | Admitting: Oncology

## 2024-07-10 DIAGNOSIS — C3411 Malignant neoplasm of upper lobe, right bronchus or lung: Secondary | ICD-10-CM | POA: Insufficient documentation

## 2024-07-10 DIAGNOSIS — Z807 Family history of other malignant neoplasms of lymphoid, hematopoietic and related tissues: Secondary | ICD-10-CM | POA: Insufficient documentation

## 2024-07-10 DIAGNOSIS — F17210 Nicotine dependence, cigarettes, uncomplicated: Secondary | ICD-10-CM | POA: Insufficient documentation

## 2024-07-10 DIAGNOSIS — N6321 Unspecified lump in the left breast, upper outer quadrant: Secondary | ICD-10-CM | POA: Insufficient documentation

## 2024-07-10 DIAGNOSIS — Z801 Family history of malignant neoplasm of trachea, bronchus and lung: Secondary | ICD-10-CM | POA: Insufficient documentation

## 2024-07-10 DIAGNOSIS — M898X8 Other specified disorders of bone, other site: Secondary | ICD-10-CM | POA: Insufficient documentation

## 2024-07-10 DIAGNOSIS — Z803 Family history of malignant neoplasm of breast: Secondary | ICD-10-CM | POA: Insufficient documentation

## 2024-07-10 DIAGNOSIS — Z8042 Family history of malignant neoplasm of prostate: Secondary | ICD-10-CM | POA: Insufficient documentation

## 2024-07-10 DIAGNOSIS — K121 Other forms of stomatitis: Secondary | ICD-10-CM | POA: Insufficient documentation

## 2024-07-10 DIAGNOSIS — Z800 Family history of malignant neoplasm of digestive organs: Secondary | ICD-10-CM | POA: Insufficient documentation

## 2024-07-10 DIAGNOSIS — Z808 Family history of malignant neoplasm of other organs or systems: Secondary | ICD-10-CM | POA: Insufficient documentation

## 2024-07-10 NOTE — Progress Notes (Incomplete)
 [facility]  [address] 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 These are consistent with metastases but she has not had scans in 6 months and I think we need to reassess. A PET scan would be helpful to see if these lesions would light up and look for the status of her lung primary and assess any other metastatic disease. She declines a spine biopsy. She is having escalating pain and radicular neuropathy so we will start her on Gabapentin  300 mg HS. PET scan done on 03/11/2024 which revealed a 1.8 x 1.3cm nodular area of soft tissue density airspace consolidation, in the upper lobe of the right lung with a max SUV of 3.8 and 1.8 x 1.2cm area of soft tissue density airspace consolidation, in the middle lobe of the right lung with a max SUV of 9.4. No uptake was seen elsewhere and no skeletal lesions were found.  Possible Mass in the Upper Outer Quadrant of the Posterior Left Breast This measures 1.5 cm. this is partially obscured and indistinct and her breasts are heterogeneously dense. Examination is more consistent with fibrocystic changes and she refuses a biopsy at this time. We will plan to repeat a ultrasound in December 2025.   Dental Issues The stomatitis is probably related to the Tagrisso  but her receeding gums and teeth falling out is not usually seen with this. She needs evaluation by a dentist and/or oral surgeon.   Tobacco Abuse She has cut back but continues to smoke 1 half pack per day.   Plan:   Patient states that she feels poorly and complains of very low energy, hot flashes 3x daily, nausea, back pain, and muscle stiffness in her hands and legs. She states that her low energy is causing feelings of  depression. I suggested that she begin taking her Effexor  regularly as this can treat her hot flashes and her depression. Today, she inquired about trying Ivermectin and Fenbendazole alongside or instead of her Tagrisso . I reassured her that Tagrisso  is currently the best option and told her that we can consider alternative options if she stops responding to her current treatment. For now, she is responding well and has manageable side effects. She has Lorazepam  to use as needed for nausea and I offered to prescribe another antiemetic, but she declined. She has a WBC of 4.8, hemoglobin of 14.0, and a slightly low platelet count of 143,000 down from 159,000. Her CMP is normal other than a low potassium of 3.3 down from 3.9 and an elevated creatinine of 1.07 up from 0.86. I will prescribe 1 pill daily of 10 mEq potassium. I also provided her with a diet resource listing foods high in potassium. I will see her back in 3 months with CBC,CMP, CEA, CT chest abdomen, and pelvis, and left breast ultrasound. She has a iodinated contrast allergy so I will prescribe 3 doses of Prednisone  50 mg to be taken prior to her CT. Her ultrasound is scheduled for 08/17/2024 at [facility].   The patient was provided an opportunity to ask questions and all were answered. The patient understands all the plans discussed today and is in agreement with them.   I provided *** minutes of face-to-face  time during this this encounter and > 50% was spent counseling as documented under my assessment and plan.   [name], MD [facility] OF [facility] [address] [place] [address] Dept: [phone] Dept Fax: [phone]   CHIEF COMPLAINT:  CC:  EGFR mutation positive Stage IVA (T4 N2 M1a) lung adenocarcinoma   Current Treatment:  Tagrisso    HISTORY OF PRESENT ILLNESS:  Andrea Holloway is a 48 y.o. female with stage  IVA (T4 N2 M1a) lung adenocarcinoma.  The stage IVA designation was based upon her initially having a malignant pleural effusion.  She had no evidence of distant metastasis.  As testing showed her to harbor  the EGFR mutation, she has been taking osimertinib  since July 2022.  She comes in today to go over there MRI of her thoracic spine, which was done due to her having increased back pain.  This pain has persisted over these past weeks.  She denies losing control of her bladder or bowel function.  The pain essentially does not radiate.   She had a screening mammogram in December 2024 which revealed architectural distortion in the right breast which corresponds to the site of her biopsy scar.  However in the left breast there is an isodense mass and a diagnostic mammogram with ultrasound was requested.  She finally had the diagnostic mammogram on February 14, 2024 and her breasts are heterogeneously dense.  In the upper outer quadrant of the posterior left breast there is an obscured mass measuring 1.5 cm.  The ultrasound confirmed I have a hypoechoic mass with indistinct margins measuring 1.5 cm and located at 2:00, 12 cm from the nipple.  An ultrasound biopsy was recommended but the patient declined.  They therefore recommended a repeat left breast ultrasound in 6 months.  The patient does not want to have any biopsies done at this time.  He also declines a biopsy of the thoracic spine.  I have reviewed her chart and materials related to her cancer extensively and collaborated history with the patient. Summary of oncologic history is as follows: Oncology History  Malignant neoplasm of upper lobe of right lung (HCC)  02/03/2021 Initial Diagnosis   Malignant neoplasm of upper lobe of right lung (HCC)   02/13/2021 - 02/13/2021 Chemotherapy         02/14/2021 Cancer Staging   Staging form: Lung, AJCC 8th Edition - Clinical stage from 02/14/2021: Stage IVA (cT4, cN2, cM1a) - Signed by Ezzard Valaria LABOR, MD on  12/27/2023 Histopathologic type: Adenocarcinoma, NOS Stage prefix: Initial diagnosis   02/20/2021 - 02/20/2021 Chemotherapy          INTERVAL HISTORY:  Bryn is seen in the clinic for follow up of her  EGFR mutation positive L747(exon19 deletion) stage IVA (T4 N2 M1a) lung adenocarcinoma. She was placed on Tagrisso  by July, 2022 and has had a good response. Patient states that she feels *** and ***.   She had a CT head without contrast on 07/03/2024 which revealed no acute intracranial abnormality.     She has a WBC of ***, hemoglobin of ***, and platelet count of ***. Her CMP ***. Her CEA is pending.     I will see her back in *** with ***.   She denies fever, chills, night sweats, or other signs of infection. She denies cardiorespiratory and gastrointestinal issues. She  denies pain. Her appetite is *** and Her weight {Weight change:10426}.  Wt Readings from Last 3 Encounters:  06/29/24 139 lb 3.2 oz (63.1 kg)  06/11/24 139 lb 6.4 oz (63.2 kg)  03/19/24 140 lb 9.6 oz (63.8 kg)   Patient states that she feels poorly and complains of very low energy, hot flashes 3x daily, nausea, back pain, and muscle stiffness in her hands and legs. She states that her low energy is causing feelings of depression. I suggested that she begin taking her Effexor  regularly as this can treat her hot flashes and her depression. Today, she inquired about trying Ivermectin and Fenbendazole alongside or instead of her Tagrisso . I reassured her that Tagrisso  is currently the best option and told her that we can consider alternative options if she stops responding to her current treatment. For now, she is responding well and has manageable side effects. She has Lorazepam  to use as needed for nausea and I offered to prescribe another antiemetic, but she declined. She has a WBC of 4.8, hemoglobin of 14.0, and a slightly low platelet count of 143,000 down from 159,000. Her CMP is normal other than a low potassium of  3.3 down from 3.9 and an elevated creatinine of 1.07 up from 0.86. I will prescribe 1 pill daily of 10 mEq potassium. I also provided her with a diet resource listing foods high in potassium. I will see her back in 3 months with CBC,CMP, CEA, CT chest abdomen, and pelvis, and left breast ultrasound. She has a iodinated contrast allergy so I will prescribe 3 doses of Prednisone  50 mg to be taken prior to her CT. Her ultrasound is scheduled for 08/17/2024 at [facility]. She denies fever, chills, night sweats, or other signs of infection. She denies cardiorespiratory and gastrointestinal issues. She  denies pain. Her appetite is better and Her weight decreased 1 pound in 3 months. She is accompanied by a relative.  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 lung (HCC) 02/03/2021   Palpitations    Seizures (HCC)     Past Surgical History:  Procedure Laterality Date   CARDIAC SURGERY     SAC OF FLUID BEHIND HEART @ 48 YEARS OF AGE   MYRINGOTOMY WITH TUBE PLACEMENT Bilateral    TUBAL LIGATION      Family History  Problem Relation Age of Onset   Hodgkin's lymphoma Sister 52   Thyroid  cancer Maternal Grandmother 45   Prostate cancer Maternal Grandfather 7   Penile cancer Maternal Grandfather 24   Breast cancer Maternal Aunt 40   Colon cancer Cousin        dx unknown age   Colon cancer Cousin        dx unknown age   Colon cancer Cousin        dx unknown age   Lung cancer Cousin 55   Brain cancer Cousin     Social History:  reports that she has been smoking cigarettes. She has never used smokeless tobacco. She reports that she does not currently use alcohol. She reports current drug use. Drug:  Marijuana.  Allergies:  Allergies  Allergen Reactions   Iodinated Contrast Media Hives   Iodine     Topical iodine -does  fine with Iodinated contrast- was given Isovue  300 on 08/01/2021 without complications    Current Medications: Current Outpatient Medications  Medication Sig Dispense Refill   ciprofloxacin (CIPRO) 500 MG tablet Take 1 tablet (500 mg total) by mouth 2 (two) times daily. 14 tablet 0   nitrofurantoin , macrocrystal-monohydrate, (MACROBID ) 100 MG capsule Take 1 capsule (100 mg total) by mouth 2 (two) times daily. 14 capsule 0   osimertinib  mesylate (TAGRISSO ) 80 MG tablet Take 1 tablet (80 mg total) by mouth daily. 30 tablet 5   No current facility-administered medications for this visit.   REVIEW OF SYSTEMS:  Review of Systems  Constitutional:  Positive for appetite change (better) and fatigue. Negative for chills, diaphoresis, fever and unexpected weight change.  HENT:   Positive for mouth sores. Negative for hearing loss, lump/mass, nosebleeds, sore throat, tinnitus, trouble swallowing and voice change.        Dental issues (teeth pain and tooth loss)  Eyes: Negative.  Negative for eye problems and icterus.  Respiratory:  Positive for cough. Negative for chest tightness, hemoptysis, shortness of breath and wheezing.   Cardiovascular: Negative.  Negative for chest pain, leg swelling and palpitations.  Gastrointestinal:  Positive for nausea (occasional). Negative for abdominal distention, abdominal pain, blood in stool, constipation, diarrhea, rectal pain and vomiting.  Endocrine: Positive for hot flashes (2-3x daily).  Genitourinary: Negative.  Negative for bladder incontinence, difficulty urinating, dyspareunia, dysuria, frequency, hematuria, menstrual problem, nocturia, pelvic pain, vaginal bleeding and vaginal discharge.   Musculoskeletal:  Positive for back pain. Negative for arthralgias, flank pain, gait problem, myalgias, neck pain and neck stiffness.  Skin:  Negative.  Negative for itching, rash and wound.  Neurological:  Negative for dizziness, extremity weakness, gait problem, headaches, light-headedness, numbness, seizures and speech difficulty.  Hematological: Negative.  Negative for adenopathy. Does not bruise/bleed easily.  Psychiatric/Behavioral:  Positive for depression. Negative for confusion, decreased concentration, sleep disturbance and suicidal ideas. The patient is not nervous/anxious.     VITALS:  Last menstrual period 12/04/2020.  Wt Readings from Last 3 Encounters:  06/29/24 139 lb 3.2 oz (63.1 kg)  06/11/24 139 lb 6.4 oz (63.2 kg)  03/19/24 140 lb 9.6 oz (63.8 kg)  There is no height or weight on file to calculate BMI.  Performance status (ECOG): 1 - Symptomatic but completely ambulatory  PHYSICAL EXAM:  Physical Exam Vitals and nursing note reviewed. Exam conducted with a chaperone present.  Constitutional:      General: She is not in acute distress.    Appearance: Normal appearance. She is normal weight. She is not ill-appearing, toxic-appearing or diaphoretic.  HENT:     Head: Normocephalic and atraumatic.     Right Ear: Tympanic membrane, ear canal and external ear normal. There is no impacted cerumen.     Left Ear: Tympanic membrane, ear canal and external ear normal. There is no impacted cerumen.     Nose: Nose normal. No congestion or rhinorrhea.     Mouth/Throat:     Mouth: Mucous membranes are moist.     Pharynx: Oropharynx is clear. No oropharyngeal exudate or posterior oropharyngeal erythema.  Eyes:     General: No scleral icterus.       Right eye: No discharge.        Left eye: No discharge.     Extraocular Movements: Extraocular movements intact.     Conjunctiva/sclera: Conjunctivae normal.     Pupils: Pupils are equal, round, and reactive to light.  Neck:  Vascular: No carotid bruit.  Cardiovascular:     Rate and Rhythm: Normal rate and regular rhythm.     Pulses: Normal pulses.     Heart  sounds: Normal heart sounds. No murmur heard.    No friction rub. No gallop.  Pulmonary:     Effort: Pulmonary effort is normal. No respiratory distress.     Breath sounds: No stridor. Examination of the right-lower field reveals wheezing. Wheezing present. No rhonchi or rales.  Chest:     Chest wall: No tenderness.     Comments: A small, seemingly benign skin tag/lesion is 3-4 cm medial to her left breast Well healed scar in the far upper right chest  Abdominal:     General: Bowel sounds are normal. There is no distension.     Palpations: Abdomen is soft. There is no hepatomegaly, splenomegaly or mass.     Tenderness: There is no abdominal tenderness. There is no right CVA tenderness, left CVA tenderness, guarding or rebound.     Hernia: No hernia is present.  Musculoskeletal:        General: No swelling, tenderness, deformity or signs of injury. Normal range of motion.     Cervical back: Normal range of motion and neck supple. No rigidity or tenderness.     Right lower leg: No edema.     Left lower leg: No edema.  Lymphadenopathy:     Cervical: No cervical adenopathy.     Right cervical: No superficial, deep or posterior cervical adenopathy.    Left cervical: No superficial, deep or posterior cervical adenopathy.     Upper Body:     Right upper body: No supraclavicular, axillary or pectoral adenopathy.     Left upper body: No supraclavicular, axillary or pectoral adenopathy.  Skin:    General: Skin is warm and dry.     Coloration: Skin is not jaundiced or pale.     Findings: No bruising, erythema, lesion or rash.  Neurological:     General: No focal deficit present.     Mental Status: She is alert and oriented to person, place, and time. Mental status is at baseline.     Cranial Nerves: No cranial nerve deficit.     Sensory: No sensory deficit.     Motor: No weakness.     Coordination: Coordination normal.     Gait: Gait normal.     Deep Tendon Reflexes: Reflexes normal.   Psychiatric:        Mood and Affect: Mood normal.        Behavior: Behavior normal.        Thought Content: Thought content normal.        Judgment: Judgment normal.    LABS:      Latest Ref Rng & Units 06/29/2024   12:36 PM 06/11/2024    3:19 PM 02/28/2024   10:16 AM  CBC  WBC 4.0 - 10.5 K/uL 6.0  4.8  5.4   Hemoglobin 12.0 - 15.0 g/dL 85.6  85.9  85.4   Hematocrit 36.0 - 46.0 % 40.7  40.2  42.5   Platelets 150 - 400 K/uL 155  143  159       Latest Ref Rng & Units 06/29/2024   12:36 PM 06/11/2024    3:19 PM 02/28/2024   10:16 AM  CMP  Glucose 70 - 99 mg/dL 92  98  97   BUN 6 - 20 mg/dL 9  9  9    Creatinine 0.44 -  1.00 mg/dL 9.08  8.92  9.13   Sodium 135 - 145 mmol/L 139  139  138   Potassium 3.5 - 5.1 mmol/L 4.0  3.3  3.9   Chloride 98 - 111 mmol/L 102  103  103   CO2 22 - 32 mmol/L 25  25  25    Calcium 8.9 - 10.3 mg/dL 9.8  9.5  9.8   Total Protein 6.5 - 8.1 g/dL 7.1  6.9  7.2   Total Bilirubin 0.0 - 1.2 mg/dL 0.4  0.4  0.5   Alkaline Phos 38 - 126 U/L 89  83  89   AST 15 - 41 U/L 22  17  20    ALT 0 - 44 U/L 11  <5  8    Lab Results  Component Value Date   CEA 2.82 02/28/2024   /  CEA ([facility])  Date Value Ref Range Status  02/28/2024 2.82 0.00 - 5.00 ng/mL Final    Comment:    (NOTE) This test was performed using Beck[employer] Coulter's paramagnetic chemiluminescent immunoassay. Values obtained from different assay methods cannot be used interchangeably. Please note that up to 8% of patients who smoke may see values 5.1-10.0 ng/ml and 1% of patients who smoke may see CEA levels >10.0 ng/ml. Performed at [facility], [address] [place], [place] [address]    No results found for: PSA1 No results found for: RJW800 No results found for: CAN125  No results found for: TOTALPROTELP, ALBUMINELP, A1GS, A2GS, BETS, BETA2SER, GAMS, MSPIKE, SPEI No results found for: TIBC, FERRITIN, IRONPCTSAT No results found for:  LDH  STUDIES:  EXAM: 07/03/2024 CT HEAD WITHOUT CONTRASTEXAM: CT HEAD WITHOUT CONTRAST IMPRESSION: 1. No acute intracranial abnormality.  Exam: 03/11/2024 PET Skull Base to Mid Thigh Impression: There are 2 nodules in the upper lobe of the right lung demonstrating hypermetabolic activity consistent with bronchogenic carcinoma.  1.8 x 1.3cm nodular area of soft tissue density airspace consolidation, in the upper lobe of the right lung, demonstrating FDG activity with a max SUV of 3.8.  1.8 x 1.2cm area of soft tissue density airspace consolidation, in the middle lobe of the right lung, demonstrating FDG activity with a max SUV of 9.4.      I,Yazmyn M Lowe,acting as a scribe for Parthenia VEAR Cornish, MD.,have documented all relevant documentation on the behalf of Prarthana VEAR Cornish, MD,as directed by  Lycia VEAR Cornish, MD while in the presence of Sion VEAR Cornish, MD.  I have reviewed this report as typed by the medical scribe, and it is complete and accurate

## 2024-07-10 NOTE — Telephone Encounter (Signed)
 07/10/24 Spoke with patient and rescheduled appt.

## 2024-07-16 ENCOUNTER — Encounter: Payer: Self-pay | Admitting: Oncology

## 2024-07-16 ENCOUNTER — Other Ambulatory Visit: Payer: Self-pay

## 2024-07-16 ENCOUNTER — Inpatient Hospital Stay (HOSPITAL_BASED_OUTPATIENT_CLINIC_OR_DEPARTMENT_OTHER): Admitting: Oncology

## 2024-07-16 ENCOUNTER — Other Ambulatory Visit: Payer: Self-pay | Admitting: Oncology

## 2024-07-16 VITALS — BP 138/87 | HR 77 | Temp 97.8°F | Resp 20 | Ht 64.0 in | Wt 139.0 lb

## 2024-07-16 DIAGNOSIS — C3411 Malignant neoplasm of upper lobe, right bronchus or lung: Secondary | ICD-10-CM | POA: Diagnosis not present

## 2024-07-16 DIAGNOSIS — M898X8 Other specified disorders of bone, other site: Secondary | ICD-10-CM | POA: Diagnosis not present

## 2024-07-16 DIAGNOSIS — F17210 Nicotine dependence, cigarettes, uncomplicated: Secondary | ICD-10-CM | POA: Diagnosis not present

## 2024-07-16 DIAGNOSIS — Z800 Family history of malignant neoplasm of digestive organs: Secondary | ICD-10-CM | POA: Diagnosis not present

## 2024-07-16 DIAGNOSIS — Z8042 Family history of malignant neoplasm of prostate: Secondary | ICD-10-CM | POA: Diagnosis not present

## 2024-07-16 DIAGNOSIS — Z803 Family history of malignant neoplasm of breast: Secondary | ICD-10-CM | POA: Diagnosis not present

## 2024-07-16 DIAGNOSIS — Z807 Family history of other malignant neoplasms of lymphoid, hematopoietic and related tissues: Secondary | ICD-10-CM | POA: Diagnosis not present

## 2024-07-16 DIAGNOSIS — K121 Other forms of stomatitis: Secondary | ICD-10-CM | POA: Diagnosis not present

## 2024-07-16 DIAGNOSIS — N6321 Unspecified lump in the left breast, upper outer quadrant: Secondary | ICD-10-CM | POA: Diagnosis not present

## 2024-07-16 DIAGNOSIS — Z801 Family history of malignant neoplasm of trachea, bronchus and lung: Secondary | ICD-10-CM | POA: Diagnosis not present

## 2024-07-16 DIAGNOSIS — Z808 Family history of malignant neoplasm of other organs or systems: Secondary | ICD-10-CM | POA: Diagnosis not present

## 2024-07-16 NOTE — Progress Notes (Incomplete)
 [facility]  [address] 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 These are consistent with metastases but she has not had scans in 6 months and I think we need to reassess. A PET scan would be helpful to see if these lesions would light up and look for the status of her lung primary and assess any other metastatic disease. She declines a spine biopsy. She is having escalating pain and radicular neuropathy so we will start her on Gabapentin  300 mg HS. PET scan done on 03/11/2024 which revealed a 1.8 x 1.3cm nodular area of soft tissue density airspace consolidation, in the upper lobe of the right lung with a max SUV of 3.8 and 1.8 x 1.2cm area of soft tissue density airspace consolidation, in the middle lobe of the right lung with a max SUV of 9.4. No uptake was seen elsewhere and no skeletal lesions were found.  Possible Mass in the Upper Outer Quadrant of the Posterior Left Breast This measures 1.5 cm. this is partially obscured and indistinct and her breasts are heterogeneously dense. Examination is more consistent with fibrocystic changes and she refuses a biopsy at this time. We will plan to repeat a ultrasound in December 2025.   Dental Issues The stomatitis is probably related to the Tagrisso  but her receeding gums and teeth falling out is not usually seen with this. She needs evaluation by a dentist and/or oral surgeon.   Tobacco Abuse She has cut back but continues to smoke 1 half pack per day.   Plan:   Patient states that she feels poorly and complains of very low energy, hot flashes 3x daily, nausea, back pain, and muscle stiffness in her hands and legs. She states that her low energy is causing feelings of  depression. I suggested that she begin taking her Effexor  regularly as this can treat her hot flashes and her depression. Today, she inquired about trying Ivermectin and Fenbendazole alongside or instead of her Tagrisso . I reassured her that Tagrisso  is currently the best option and told her that we can consider alternative options if she stops responding to her current treatment. For now, she is responding well and has manageable side effects. She has Lorazepam  to use as needed for nausea and I offered to prescribe another antiemetic, but she declined. She has a WBC of 4.8, hemoglobin of 14.0, and a slightly low platelet count of 143,000 down from 159,000. Her CMP is normal other than a low potassium of 3.3 down from 3.9 and an elevated creatinine of 1.07 up from 0.86. I will prescribe 1 pill daily of 10 mEq potassium. I also provided her with a diet resource listing foods high in potassium. I will see her back in 3 months with CBC,CMP, CEA, CT chest abdomen, and pelvis, and left breast ultrasound. She has a iodinated contrast allergy so I will prescribe 3 doses of Prednisone  50 mg to be taken prior to her CT. Her ultrasound is scheduled for 08/17/2024 at [facility].   The patient was provided an opportunity to ask questions and all were answered. The patient understands all the plans discussed today and is in agreement with them.   I provided *** minutes of face-to-face  time during this this encounter and > 50% was spent counseling as documented under my assessment and plan.   [name], MD [facility] OF [facility] [address] [place] [address] Dept: [phone] Dept Fax: [phone]   CHIEF COMPLAINT:  CC:  EGFR mutation positive Stage IVA (T4 N2 M1a) lung adenocarcinoma   Current Treatment:  Tagrisso    HISTORY OF PRESENT ILLNESS:  Andrea Holloway is a 48 y.o. female with stage  IVA (T4 N2 M1a) lung adenocarcinoma.  The stage IVA designation was based upon her initially having a malignant pleural effusion.  She had no evidence of distant metastasis.  As testing showed her to harbor  the EGFR mutation, she has been taking osimertinib  since July 2022.  She comes in today to go over there MRI of her thoracic spine, which was done due to her having increased back pain.  This pain has persisted over these past weeks.  She denies losing control of her bladder or bowel function.  The pain essentially does not radiate.   She had a screening mammogram in December 2024 which revealed architectural distortion in the right breast which corresponds to the site of her biopsy scar.  However in the left breast there is an isodense mass and a diagnostic mammogram with ultrasound was requested.  She finally had the diagnostic mammogram on February 14, 2024 and her breasts are heterogeneously dense.  In the upper outer quadrant of the posterior left breast there is an obscured mass measuring 1.5 cm.  The ultrasound confirmed I have a hypoechoic mass with indistinct margins measuring 1.5 cm and located at 2:00, 12 cm from the nipple.  An ultrasound biopsy was recommended but the patient declined.  They therefore recommended a repeat left breast ultrasound in 6 months.  The patient does not want to have any biopsies done at this time.  He also declines a biopsy of the thoracic spine.  I have reviewed her chart and materials related to her cancer extensively and collaborated history with the patient. Summary of oncologic history is as follows: Oncology History  Malignant neoplasm of upper lobe of right lung (HCC)  02/03/2021 Initial Diagnosis   Malignant neoplasm of upper lobe of right lung (HCC)   02/13/2021 - 02/13/2021 Chemotherapy         02/14/2021 Cancer Staging   Staging form: Lung, AJCC 8th Edition - Clinical stage from 02/14/2021: Stage IVA (cT4, cN2, cM1a) - Signed by Ezzard Valaria LABOR, MD on  12/27/2023 Histopathologic type: Adenocarcinoma, NOS Stage prefix: Initial diagnosis   02/20/2021 - 02/20/2021 Chemotherapy          INTERVAL HISTORY:  Andrea Holloway is seen in the clinic for follow up of her  EGFR mutation positive L747(exon19 deletion) stage IVA (T4 N2 M1a) lung adenocarcinoma. She was placed on Tagrisso  by July, 2022 and has had a good response. Patient states that she feels *** and ***.   She had a CT head without contrast on 07/03/2024 which revealed no acute intracranial abnormality.     She has a WBC of ***, hemoglobin of ***, and platelet count of ***. Her CMP ***. Her CEA is pending.     I will see her back in *** with ***.   She denies fever, chills, night sweats, or other signs of infection. She denies cardiorespiratory and gastrointestinal issues. She  denies pain. Her appetite is *** and Her weight {Weight change:10426}.  Wt Readings from Last 3 Encounters:  06/29/24 139 lb 3.2 oz (63.1 kg)  06/11/24 139 lb 6.4 oz (63.2 kg)  03/19/24 140 lb 9.6 oz (63.8 kg)   Patient states that she feels poorly and complains of very low energy, hot flashes 3x daily, nausea, back pain, and muscle stiffness in her hands and legs. She states that her low energy is causing feelings of depression. I suggested that she begin taking her Effexor  regularly as this can treat her hot flashes and her depression. Today, she inquired about trying Ivermectin and Fenbendazole alongside or instead of her Tagrisso . I reassured her that Tagrisso  is currently the best option and told her that we can consider alternative options if she stops responding to her current treatment. For now, she is responding well and has manageable side effects. She has Lorazepam  to use as needed for nausea and I offered to prescribe another antiemetic, but she declined. She has a WBC of 4.8, hemoglobin of 14.0, and a slightly low platelet count of 143,000 down from 159,000. Her CMP is normal other than a low potassium of  3.3 down from 3.9 and an elevated creatinine of 1.07 up from 0.86. I will prescribe 1 pill daily of 10 mEq potassium. I also provided her with a diet resource listing foods high in potassium. I will see her back in 3 months with CBC,CMP, CEA, CT chest abdomen, and pelvis, and left breast ultrasound. She has a iodinated contrast allergy so I will prescribe 3 doses of Prednisone  50 mg to be taken prior to her CT. Her ultrasound is scheduled for 08/17/2024 at [facility]. She denies fever, chills, night sweats, or other signs of infection. She denies cardiorespiratory and gastrointestinal issues. She  denies pain. Her appetite is better and Her weight decreased 1 pound in 3 months. She is accompanied by a relative.  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 lung (HCC) 02/03/2021   Palpitations    Seizures (HCC)     Past Surgical History:  Procedure Laterality Date   CARDIAC SURGERY     SAC OF FLUID BEHIND HEART @ 48 YEARS OF AGE   MYRINGOTOMY WITH TUBE PLACEMENT Bilateral    TUBAL LIGATION      Family History  Problem Relation Age of Onset   Hodgkin's lymphoma Sister 52   Thyroid  cancer Maternal Grandmother 45   Prostate cancer Maternal Grandfather 7   Penile cancer Maternal Grandfather 24   Breast cancer Maternal Aunt 40   Colon cancer Cousin        dx unknown age   Colon cancer Cousin        dx unknown age   Colon cancer Cousin        dx unknown age   Lung cancer Cousin 55   Brain cancer Cousin     Social History:  reports that she has been smoking cigarettes. She has never used smokeless tobacco. She reports that she does not currently use alcohol. She reports current drug use. Drug:  Marijuana.  Allergies:  Allergies  Allergen Reactions   Iodinated Contrast Media Hives   Iodine     Topical iodine -does  fine with Iodinated contrast- was given Isovue  300 on 08/01/2021 without complications    Current Medications: Current Outpatient Medications  Medication Sig Dispense Refill   ciprofloxacin (CIPRO) 500 MG tablet Take 1 tablet (500 mg total) by mouth 2 (two) times daily. 14 tablet 0   nitrofurantoin , macrocrystal-monohydrate, (MACROBID ) 100 MG capsule Take 1 capsule (100 mg total) by mouth 2 (two) times daily. 14 capsule 0   osimertinib  mesylate (TAGRISSO ) 80 MG tablet Take 1 tablet (80 mg total) by mouth daily. 30 tablet 5   No current facility-administered medications for this visit.   REVIEW OF SYSTEMS:  Review of Systems  Constitutional:  Positive for appetite change (better) and fatigue. Negative for chills, diaphoresis, fever and unexpected weight change.  HENT:   Positive for mouth sores. Negative for hearing loss, lump/mass, nosebleeds, sore throat, tinnitus, trouble swallowing and voice change.        Dental issues (teeth pain and tooth loss)  Eyes: Negative.  Negative for eye problems and icterus.  Respiratory:  Positive for cough. Negative for chest tightness, hemoptysis, shortness of breath and wheezing.   Cardiovascular: Negative.  Negative for chest pain, leg swelling and palpitations.  Gastrointestinal:  Positive for nausea (occasional). Negative for abdominal distention, abdominal pain, blood in stool, constipation, diarrhea, rectal pain and vomiting.  Endocrine: Positive for hot flashes (2-3x daily).  Genitourinary: Negative.  Negative for bladder incontinence, difficulty urinating, dyspareunia, dysuria, frequency, hematuria, menstrual problem, nocturia, pelvic pain, vaginal bleeding and vaginal discharge.   Musculoskeletal:  Positive for back pain. Negative for arthralgias, flank pain, gait problem, myalgias, neck pain and neck stiffness.  Skin:  Negative.  Negative for itching, rash and wound.  Neurological:  Negative for dizziness, extremity weakness, gait problem, headaches, light-headedness, numbness, seizures and speech difficulty.  Hematological: Negative.  Negative for adenopathy. Does not bruise/bleed easily.  Psychiatric/Behavioral:  Positive for depression. Negative for confusion, decreased concentration, sleep disturbance and suicidal ideas. The patient is not nervous/anxious.     VITALS:  Last menstrual period 12/04/2020.  Wt Readings from Last 3 Encounters:  06/29/24 139 lb 3.2 oz (63.1 kg)  06/11/24 139 lb 6.4 oz (63.2 kg)  03/19/24 140 lb 9.6 oz (63.8 kg)  There is no height or weight on file to calculate BMI.  Performance status (ECOG): 1 - Symptomatic but completely ambulatory  PHYSICAL EXAM:  Physical Exam Vitals and nursing note reviewed. Exam conducted with a chaperone present.  Constitutional:      General: She is not in acute distress.    Appearance: Normal appearance. She is normal weight. She is not ill-appearing, toxic-appearing or diaphoretic.  HENT:     Head: Normocephalic and atraumatic.     Right Ear: Tympanic membrane, ear canal and external ear normal. There is no impacted cerumen.     Left Ear: Tympanic membrane, ear canal and external ear normal. There is no impacted cerumen.     Nose: Nose normal. No congestion or rhinorrhea.     Mouth/Throat:     Mouth: Mucous membranes are moist.     Pharynx: Oropharynx is clear. No oropharyngeal exudate or posterior oropharyngeal erythema.  Eyes:     General: No scleral icterus.       Right eye: No discharge.        Left eye: No discharge.     Extraocular Movements: Extraocular movements intact.     Conjunctiva/sclera: Conjunctivae normal.     Pupils: Pupils are equal, round, and reactive to light.  Neck:  Vascular: No carotid bruit.  Cardiovascular:     Rate and Rhythm: Normal rate and regular rhythm.     Pulses: Normal pulses.     Heart  sounds: Normal heart sounds. No murmur heard.    No friction rub. No gallop.  Pulmonary:     Effort: Pulmonary effort is normal. No respiratory distress.     Breath sounds: No stridor. Examination of the right-lower field reveals wheezing. Wheezing present. No rhonchi or rales.  Chest:     Chest wall: No tenderness.     Comments: A small, seemingly benign skin tag/lesion is 3-4 cm medial to her left breast Well healed scar in the far upper right chest  Abdominal:     General: Bowel sounds are normal. There is no distension.     Palpations: Abdomen is soft. There is no hepatomegaly, splenomegaly or mass.     Tenderness: There is no abdominal tenderness. There is no right CVA tenderness, left CVA tenderness, guarding or rebound.     Hernia: No hernia is present.  Musculoskeletal:        General: No swelling, tenderness, deformity or signs of injury. Normal range of motion.     Cervical back: Normal range of motion and neck supple. No rigidity or tenderness.     Right lower leg: No edema.     Left lower leg: No edema.  Lymphadenopathy:     Cervical: No cervical adenopathy.     Right cervical: No superficial, deep or posterior cervical adenopathy.    Left cervical: No superficial, deep or posterior cervical adenopathy.     Upper Body:     Right upper body: No supraclavicular, axillary or pectoral adenopathy.     Left upper body: No supraclavicular, axillary or pectoral adenopathy.  Skin:    General: Skin is warm and dry.     Coloration: Skin is not jaundiced or pale.     Findings: No bruising, erythema, lesion or rash.  Neurological:     General: No focal deficit present.     Mental Status: She is alert and oriented to person, place, and time. Mental status is at baseline.     Cranial Nerves: No cranial nerve deficit.     Sensory: No sensory deficit.     Motor: No weakness.     Coordination: Coordination normal.     Gait: Gait normal.     Deep Tendon Reflexes: Reflexes normal.   Psychiatric:        Mood and Affect: Mood normal.        Behavior: Behavior normal.        Thought Content: Thought content normal.        Judgment: Judgment normal.    LABS:      Latest Ref Rng & Units 06/29/2024   12:36 PM 06/11/2024    3:19 PM 02/28/2024   10:16 AM  CBC  WBC 4.0 - 10.5 K/uL 6.0  4.8  5.4   Hemoglobin 12.0 - 15.0 g/dL 85.6  85.9  85.4   Hematocrit 36.0 - 46.0 % 40.7  40.2  42.5   Platelets 150 - 400 K/uL 155  143  159       Latest Ref Rng & Units 06/29/2024   12:36 PM 06/11/2024    3:19 PM 02/28/2024   10:16 AM  CMP  Glucose 70 - 99 mg/dL 92  98  97   BUN 6 - 20 mg/dL 9  9  9    Creatinine 0.44 -  1.00 mg/dL 9.08  8.92  9.13   Sodium 135 - 145 mmol/L 139  139  138   Potassium 3.5 - 5.1 mmol/L 4.0  3.3  3.9   Chloride 98 - 111 mmol/L 102  103  103   CO2 22 - 32 mmol/L 25  25  25    Calcium 8.9 - 10.3 mg/dL 9.8  9.5  9.8   Total Protein 6.5 - 8.1 g/dL 7.1  6.9  7.2   Total Bilirubin 0.0 - 1.2 mg/dL 0.4  0.4  0.5   Alkaline Phos 38 - 126 U/L 89  83  89   AST 15 - 41 U/L 22  17  20    ALT 0 - 44 U/L 11  <5  8    Lab Results  Component Value Date   CEA 2.82 02/28/2024   /  CEA ([facility])  Date Value Ref Range Status  02/28/2024 2.82 0.00 - 5.00 ng/mL Final    Comment:    (NOTE) This test was performed using [employer] Coulter's paramagnetic chemiluminescent immunoassay. Values obtained from different assay methods cannot be used interchangeably. Please note that up to 8% of patients who smoke may see values 5.1-10.0 ng/ml and 1% of patients who smoke may see CEA levels >10.0 ng/ml. Performed at [facility], [address] [place], [place] [address]    No results found for: PSA1 No results found for: RJW800 No results found for: CAN125  No results found for: TOTALPROTELP, ALBUMINELP, A1GS, A2GS, BETS, BETA2SER, GAMS, MSPIKE, SPEI No results found for: TIBC, FERRITIN, IRONPCTSAT No results found for:  LDH  STUDIES:  EXAM: 07/03/2024 CT HEAD WITHOUT CONTRASTEXAM: CT HEAD WITHOUT CONTRAST IMPRESSION: 1. No acute intracranial abnormality.  Exam: 03/11/2024 PET Skull Base to Mid Thigh Impression: There are 2 nodules in the upper lobe of the right lung demonstrating hypermetabolic activity consistent with bronchogenic carcinoma.  1.8 x 1.3cm nodular area of soft tissue density airspace consolidation, in the upper lobe of the right lung, demonstrating FDG activity with a max SUV of 3.8.  1.8 x 1.2cm area of soft tissue density airspace consolidation, in the middle lobe of the right lung, demonstrating FDG activity with a max SUV of 9.4.      I,Yazmyn M Lowe,acting as a scribe for Parthenia VEAR Cornish, MD.,have documented all relevant documentation on the behalf of Prarthana VEAR Cornish, MD,as directed by  Lycia VEAR Cornish, MD while in the presence of Sion VEAR Cornish, MD.  I have reviewed this report as typed by the medical scribe, and it is complete and accurate

## 2024-07-16 NOTE — Progress Notes (Signed)
 [facility]  [address] 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 These are consistent with metastases but she has not had scans in 6 months and I think we need to reassess. A PET scan would be helpful to see if these lesions would light up and look for the status of her lung primary and assess any other metastatic disease. She declines a spine biopsy. She is having escalating pain and radicular neuropathy so we will start her on Gabapentin  300 mg HS. PET scan done on 03/11/2024 which revealed a 1.8 x 1.3cm nodular area of soft tissue density airspace consolidation, in the upper lobe of the right lung with a max SUV of 3.8 and 1.8 x 1.2cm area of soft tissue density airspace consolidation, in the middle lobe of the right lung with a max SUV of 9.4. No uptake was seen elsewhere and no skeletal lesions were found. She is interested in starting Ivermectin and Febendazole for treatment of her cancer and stopping the Tagrisso . She reports unwanted side effects from her current Tagrisso  treatment, including heart palpitations, altered mental status, and mouth sores. I reassured her that this is her choice, but emphasized that her cancer is not cured and is effectively controlled with Tagrisso . She does have anxiety about the effects of Tagrisso  on the heart and I informed her that Tagrisso  is not damaging to the heart. She is adamant about discontinuing Tagrisso  and starting Ivermectin and Febendazole. We will get a baseline CT scan now.  Possible Mass in the Upper Outer Quadrant of the Posterior Left Breast This measures 1.5 cm. this is partially obscured and indistinct and her breasts are heterogeneously dense. Examination is more  consistent with fibrocystic changes and she refuses a biopsy at this time. We will plan to repeat a ultrasound now since it is nearly 6 months.  Dental Issues The stomatitis is probably related to the Tagrisso  but her receeding gums and teeth falling out is not usually seen with this. She needs evaluation by a dentist and/or oral surgeon.   Tobacco Abuse She has cut back but continues to smoke 1 half pack per day.   Plan: She feels poorly and is here to discuss her plans to change therapy. She is interested in starting Ivermectin and Febendazole for treatment of her cancer and stopping the Tagrisso . She reports unwanted side effects from her current Tagrisso  treatment, including heart palpitations, altered mental status, and mouth sores. I reassured her that this is her choice, but emphasized that her cancer is not cured and is effectively controlled with Tagrisso . She does have anxiety about the effects of Tagrisso  on the heart and I informed her that Tagrisso  is not damaging to the heart.  We have had several discussions about this treatment, and she requests that I continue to follow her. She is adamant about discontinuing Tagrisso  and starting Ivermectin and Febendazole. I suggested that she have a CT chest, abdomen, pelvis without contrast now to have a baseline scan to monitor any new changes. She was found to have an indeterminate mass in the left breast at 2 o'clock measuring 1.5 cm in June 2025. A biopsy was discussed, but she refused and is now willing to have a repeat ultrasound to  monitor this area. I will order a left breast ultrasound for her. She also inquires about having a mastectomy and I informed her that it may be inadvisable to have this done without a cancer diagnosis. She had a CT head without contrast on 07/03/2024 which revealed no acute intracranial abnormality. I will see her back in 3 months with CBC and CMP. If she is doing well, we will repeat a CT chest in 6 months. The  patient was provided an opportunity to ask questions and all were answered. The patient understands all the plans discussed today and is in agreement with them.   I provided 26 minutes of face-to-face time during this this encounter and > 50% was spent counseling as documented under my assessment and plan.   [name], MD [facility] DE[facility] OF [facility] [address] [place] [address] Dept: [phone] Dept Fax: [phone]   CHIEF COMPLAINT:  CC:  EGFR mutation positive Stage IVA (T4 N2 M1a) lung adenocarcinoma   Current Treatment:  Tagrisso    HISTORY OF PRESENT ILLNESS:  Andrea Holloway is a 48 y.o. female with stage IVA (T4 N2 M1a) lung adenocarcinoma.  The stage IVA designation was based upon her initially having a malignant pleural effusion.  She had no evidence of distant metastasis.  As testing showed her to harbor  the EGFR mutation, she has been taking osimertinib  since July 2022.  She comes in today to go over there MRI of her thoracic spine, which was done due to her having increased back pain.  This pain has persisted over these past weeks.  She denies losing control of her bladder or bowel function.  The pain essentially does not radiate.   She had a screening mammogram in December 2024 which revealed architectural distortion in the right breast which corresponds to the site of her biopsy scar.  However in the left breast there is an isodense mass and a diagnostic mammogram with ultrasound was requested.  She finally had the diagnostic mammogram on February 14, 2024 and her breasts are heterogeneously dense.  In the upper outer quadrant of the posterior left breast there is an obscured mass measuring 1.5 cm.  The ultrasound confirmed I have a hypoechoic mass with indistinct margins measuring 1.5 cm and located at 2:00, 12 cm from the nipple.  An ultrasound biopsy was recommended but the patient declined.  They  therefore recommended a repeat left breast ultrasound in 6 months.  The patient does not want to have any biopsies done at this time.  He also declines a biopsy of the thoracic spine.  I have reviewed her chart and materials related to her cancer extensively and collaborated history with the patient. Summary of oncologic history is as follows: Oncology History  Malignant neoplasm of upper lobe of right lung (HCC)  02/03/2021 Initial Diagnosis   Malignant neoplasm of upper lobe of right lung (HCC)   02/13/2021 - 02/13/2021 Chemotherapy         02/14/2021 Cancer Staging   Staging form: Lung, AJCC 8th Edition - Clinical stage from 02/14/2021: Stage IVA (cT4, cN2, cM1a) - Signed by Ezzard Valaria LABOR, MD on 12/27/2023 Histopathologic type: Adenocarcinoma, NOS Stage prefix: Initial diagnosis   02/20/2021 - 02/20/2021 Chemotherapy          INTERVAL HISTORY:  Andrea Holloway is seen in the clinic for follow up of her  EGFR mutation positive L747(exon19 deletion) stage IVA (T4 N2  M1a) lung adenocarcinoma. She was placed on Tagrisso  by July, 2022 and has had a good response. Patient states that she feels poorly and is here to discuss her plans to change therapy. She is interested in starting Ivermectin and Febendazole for treatment of her cancer and stopping the Tagrisso . She reports unwanted side effects from her current Tagrisso  treatment, including heart palpitations, altered mental status, and mouth sores. I reassured her that this is her choice, but emphasized that her cancer is not cured and is effectively controlled with Tagrisso . She does have anxiety about the effects of Tagrisso  on the heart and I informed her that Tagrisso  is not damaging to the heart. She is adamant about discontinuing Tagrisso  and starting Ivermectin and Febendazole. I suggested that she have a CT chest, abdomen, pelvis without contrast now to have a baseline scan to monitor any new changes. She was found to have an indeterminate mass in  the left breast at 2 o'clock measuring 1.5 cm in June 2025. A biopsy was discussed, but she refused and is now willing to have a repeat ultrasound to monitor this area. I will order a left breast ultrasound for her. She also inquires about having a mastectomy and I informed her that it may be inadvisable to have this done without a cancer diagnosis. She had a CT head without contrast on 07/03/2024 which revealed no acute intracranial abnormality. I will see her back in 3 months with CBC and CMP. If she is doing well, we will repeat a CT chest in 6 months. She denies fever, chills, night sweats, or other signs of infection. She denies gastrointestinal issues. Her appetite is fair and Her weight has been stable. She is 139 lb today.  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 lung (HCC) 02/03/2021   Palpitations    Seizures (HCC)     Past Surgical History:  Procedure Laterality Date   CARDIAC SURGERY     SAC OF FLUID BEHIND HEART @ 48 YEARS OF AGE   MYRINGOTOMY WITH TUBE PLACEMENT Bilateral    TUBAL LIGATION      Family History  Problem Relation Age of Onset   Hodgkin's lymphoma Sister 76   Thyroid  cancer Maternal Grandmother 36   Prostate cancer Maternal Grandfather 44   Penile cancer Maternal Grandfather 65   Breast cancer Maternal Aunt 40   Colon cancer Cousin        dx unknown age   Colon cancer Cousin        dx unknown age   Colon cancer Cousin        dx unknown age   Lung cancer Cousin 48   Brain cancer Cousin     Social History:  reports that she has been smoking cigarettes. She has never used smokeless tobacco. She reports that she does not currently use alcohol. She reports current drug use. Drug: Marijuana.  Allergies:   Allergies  Allergen Reactions   Iodinated Contrast Media Hives   Iodine     Topical iodine -does fine with Iodinated contrast- was given Isovue  300 on 08/01/2021 without complications    Current Medications: Current Outpatient Medications  Medication Sig Dispense Refill   ciprofloxacin  (  CIPRO ) 500 MG tablet Take 1 tablet (500 mg total) by mouth 2 (two) times daily. (Patient not taking: Reported on 07/16/2024) 14 tablet 0   nitrofurantoin , macrocrystal-monohydrate, (MACROBID ) 100 MG capsule Take 1 capsule (100 mg total) by mouth 2 (two) times daily. (Patient not taking: Reported on 07/16/2024) 14 capsule 0   osimertinib  mesylate (TAGRISSO ) 80 MG tablet Take 1 tablet (80 mg total) by mouth daily. 30 tablet 5   No current facility-administered medications for this visit.   REVIEW OF SYSTEMS:  Review of Systems  Constitutional:  Positive for appetite change (better) and fatigue. Negative for chills, diaphoresis, fever and unexpected weight change.  HENT:   Positive for mouth sores. Negative for hearing loss, lump/mass, nosebleeds, sore throat, tinnitus, trouble swallowing and voice change.        Dental issues (teeth pain and tooth loss)  Eyes: Negative.  Negative for eye problems and icterus.  Respiratory:  Positive for cough. Negative for chest tightness, hemoptysis, shortness of breath and wheezing.   Cardiovascular: Negative.  Negative for chest pain, leg swelling and palpitations.       Heart palpitations  Gastrointestinal:  Positive for nausea (occasional). Negative for abdominal distention, abdominal pain, blood in stool, constipation, diarrhea, rectal pain and vomiting.  Endocrine: Positive for hot flashes (2-3x daily).  Genitourinary:  Positive for dysuria (intermittent) and frequency (intermittent). Negative for bladder incontinence, difficulty urinating, dyspareunia, hematuria, menstrual problem, nocturia, pelvic pain, vaginal bleeding and vaginal discharge.   Musculoskeletal:   Positive for back pain. Negative for arthralgias, flank pain, gait problem, myalgias, neck pain and neck stiffness.  Skin: Negative.  Negative for itching, rash and wound.  Neurological:  Negative for dizziness, extremity weakness, gait problem, headaches, light-headedness, numbness, seizures and speech difficulty.  Hematological: Negative.  Negative for adenopathy. Does not bruise/bleed easily.  Psychiatric/Behavioral:  Positive for depression. Negative for confusion, decreased concentration, sleep disturbance and suicidal ideas. The patient is nervous/anxious.        The patient feels she has altered mental status from the Tagrisso     VITALS:  Blood pressure 138/87, pulse 77, temperature 97.8 F (36.6 C), temperature source Oral, resp. rate 20, height 5' 4 (1.626 m), weight 139 lb (63 kg), last menstrual period 12/04/2020, SpO2 99%.  Wt Readings from Last 3 Encounters:  07/16/24 139 lb (63 kg)  06/29/24 139 lb 3.2 oz (63.1 kg)  06/11/24 139 lb 6.4 oz (63.2 kg)  Body mass index is 23.86 kg/m.  Performance status (ECOG): 1 - Symptomatic but completely ambulatory  PHYSICAL EXAM:  Physical Exam Vitals and nursing note reviewed. Exam conducted with a chaperone present.  Constitutional:      General: She is not in acute distress.    Appearance: Normal appearance. She is normal weight. She is not ill-appearing, toxic-appearing or diaphoretic.  HENT:     Head: Normocephalic and atraumatic.     Right Ear: Tympanic membrane, ear canal and external ear normal. There is no impacted cerumen.     Left Ear: Tympanic membrane, ear canal and external ear normal. There is no impacted cerumen.     Nose: Nose normal. No congestion or rhinorrhea.     Mouth/Throat:     Mouth: Mucous membranes are moist.     Pharynx: Oropharynx is clear. No oropharyngeal exudate or posterior oropharyngeal erythema.  Eyes:     General: No scleral icterus.       Right eye: No discharge.        Left eye: No discharge.  Extraocular Movements: Extraocular movements intact.     Conjunctiva/sclera: Conjunctivae normal.     Pupils: Pupils are equal, round, and reactive to light.  Neck:     Vascular: No carotid bruit.  Cardiovascular:     Rate and Rhythm: Normal rate and regular rhythm.     Pulses: Normal pulses.     Heart sounds: Normal heart sounds. No murmur heard.    No friction rub. No gallop.  Pulmonary:     Effort: Pulmonary effort is normal. No respiratory distress.     Breath sounds: No stridor. Examination of the right-lower field reveals wheezing. Wheezing present. No rhonchi or rales.  Chest:     Chest wall: No tenderness.     Comments: A small, seemingly benign skin tag/lesion is 3-4 cm medial to her left breast Well healed scar in the far upper right chest  Abdominal:     General: Bowel sounds are normal. There is no distension.     Palpations: Abdomen is soft. There is no hepatomegaly, splenomegaly or mass.     Tenderness: There is no abdominal tenderness. There is no right CVA tenderness, left CVA tenderness, guarding or rebound.     Hernia: No hernia is present.  Musculoskeletal:        General: No swelling, tenderness, deformity or signs of injury. Normal range of motion.     Cervical back: Normal range of motion and neck supple. No rigidity or tenderness.     Right lower leg: No edema.     Left lower leg: No edema.  Lymphadenopathy:     Cervical: No cervical adenopathy.     Right cervical: No superficial, deep or posterior cervical adenopathy.    Left cervical: No superficial, deep or posterior cervical adenopathy.     Upper Body:     Right upper body: No supraclavicular, axillary or pectoral adenopathy.     Left upper body: No supraclavicular, axillary or pectoral adenopathy.  Skin:    General: Skin is warm and dry.     Coloration: Skin is not jaundiced or pale.     Findings: No bruising, erythema, lesion or rash.  Neurological:     General: No focal deficit present.      Mental Status: She is alert and oriented to person, place, and time. Mental status is at baseline.     Cranial Nerves: No cranial nerve deficit.     Sensory: No sensory deficit.     Motor: No weakness.     Coordination: Coordination normal.     Gait: Gait normal.     Deep Tendon Reflexes: Reflexes normal.  Psychiatric:        Mood and Affect: Mood normal.        Behavior: Behavior normal.        Thought Content: Thought content normal.        Judgment: Judgment normal.    LABS:      Latest Ref Rng & Units 06/29/2024   12:36 PM 06/11/2024    3:19 PM 02/28/2024   10:16 AM  CBC  WBC 4.0 - 10.5 K/uL 6.0  4.8  5.4   Hemoglobin 12.0 - 15.0 g/dL 85.6  85.9  85.4   Hematocrit 36.0 - 46.0 % 40.7  40.2  42.5   Platelets 150 - 400 K/uL 155  143  159       Latest Ref Rng & Units 06/29/2024   12:36 PM 06/11/2024    3:19 PM 02/28/2024   10:16 AM  CMP  Glucose 70 - 99 mg/dL 92  98  97   BUN 6 - 20 mg/dL 9  9  9    Creatinine 0.44 - 1.00 mg/dL 9.08  8.92  9.13   Sodium 135 - 145 mmol/L 139  139  138   Potassium 3.5 - 5.1 mmol/L 4.0  3.3  3.9   Chloride 98 - 111 mmol/L 102  103  103   CO2 22 - 32 mmol/L 25  25  25    Calcium 8.9 - 10.3 mg/dL 9.8  9.5  9.8   Total Protein 6.5 - 8.1 g/dL 7.1  6.9  7.2   Total Bilirubin 0.0 - 1.2 mg/dL 0.4  0.4  0.5   Alkaline Phos 38 - 126 U/L 89  83  89   AST 15 - 41 U/L 22  17  20    ALT 0 - 44 U/L 11  <5  8    Lab Results  Component Value Date   CEA 2.82 02/28/2024   /  CEA ([facility])  Date Value Ref Range Status  02/28/2024 2.82 0.00 - 5.00 ng/mL Final    Comment:    (NOTE) This test was performed using Beckman Coulter's paramagnetic chemiluminescent immunoassay. Values obtained from different assay methods cannot be used interchangeably. Please note that up to 8% of patients who smoke may see values 5.1-10.0 ng/ml and 1% of patients who smoke may see CEA levels >10.0 ng/ml. Performed at [facility], [address] [place], [place] [address]    No results found for: PSA1 No results found for: RJW800 No results found for: CAN125  No results found for: TOTALPROTELP, ALBUMINELP, A1GS, A2GS, BETS, BETA2SER, GAMS, MSPIKE, SPEI No results found for: TIBC, FERRITIN, IRONPCTSAT No results found for: LDH  STUDIES:  Exam: 03/11/2024 PET Skull Base to Mid Thigh Impression: There are 2 nodules in the upper lobe of the right lung demonstrating hypermetabolic activity consistent with bronchogenic carcinoma.  1.8 x 1.3cm nodular area of soft tissue density airspace consolidation, in the upper lobe of the right lung, demonstrating FDG activity with a max SUV of 3.8.  1.8 x 1.2cm area of soft tissue density airspace consolidation, in the middle lobe of the right lung, demonstrating FDG activity with a max SUV of 9.4.      I,Roanne Haye H Arrietty Dercole,acting as a scribe for Mulan VEAR Cornish, MD.,have documented all relevant documentation on the behalf of Shamiah VEAR Cornish, MD,as directed by  Inda VEAR Cornish, MD while in the presence of Shontavia VEAR Cornish, MD.  I have reviewed this report as typed by the medical scribe, and it is complete and accurate

## 2024-07-17 ENCOUNTER — Telehealth: Payer: Self-pay | Admitting: Oncology

## 2024-07-17 NOTE — Telephone Encounter (Signed)
 Patient has been scheduled for follow-up visit per 07/16/24 LOS.  Pt aware of scheduled appt details.

## 2024-07-24 ENCOUNTER — Inpatient Hospital Stay (HOSPITAL_BASED_OUTPATIENT_CLINIC_OR_DEPARTMENT_OTHER): Admission: RE | Admit: 2024-07-24 | Admitting: Radiology

## 2024-07-29 ENCOUNTER — Other Ambulatory Visit (HOSPITAL_COMMUNITY): Payer: Self-pay

## 2024-08-03 ENCOUNTER — Other Ambulatory Visit (HOSPITAL_COMMUNITY): Payer: Self-pay

## 2024-08-05 ENCOUNTER — Other Ambulatory Visit: Payer: Self-pay

## 2024-08-06 ENCOUNTER — Other Ambulatory Visit: Payer: Self-pay

## 2024-08-06 NOTE — Progress Notes (Signed)
 Per visit on 11/13, patient refused to continue Tagrisso  and asked to be treated with ivermectin and febendazole. Dr. Cornelius agreed to allow patient to be disenrolled for now, and will re-enroll if patient decides to go back on Tagrisso .

## 2024-08-10 ENCOUNTER — Other Ambulatory Visit: Payer: Self-pay

## 2024-08-13 ENCOUNTER — Other Ambulatory Visit: Payer: Self-pay

## 2024-08-13 ENCOUNTER — Other Ambulatory Visit (HOSPITAL_COMMUNITY): Payer: Self-pay

## 2024-08-13 NOTE — Progress Notes (Signed)
 Specialty Pharmacy Refill Coordination Note  Andrea Holloway is a 48 y.o. female contacted today regarding refills of specialty medication(s) Osimertinib  Mesylate (TAGRISSO )   Patient requested Delivery   Delivery date: 08/14/24   Verified address: [address] [place] [address]   Medication will be filled on: 08/13/24

## 2024-08-15 ENCOUNTER — Encounter: Payer: Self-pay | Admitting: Oncology

## 2024-08-17 ENCOUNTER — Other Ambulatory Visit

## 2024-08-17 ENCOUNTER — Telehealth: Payer: Self-pay | Admitting: Oncology

## 2024-08-17 NOTE — Telephone Encounter (Signed)
 08/17/24 Appt rescheduled at [facility] to 08/25/24 @2pm .Mychart msg sent to patient with new appt.

## 2024-08-25 ENCOUNTER — Other Ambulatory Visit

## 2024-08-31 LAB — GUARDANT 360

## 2024-09-08 ENCOUNTER — Telehealth: Payer: Self-pay

## 2024-09-08 ENCOUNTER — Other Ambulatory Visit: Payer: Self-pay | Admitting: Hematology and Oncology

## 2024-09-08 MED ORDER — PREDNISONE 50 MG PO TABS: 50.0000 mg | ORAL_TABLET | ORAL | 0 refills | Status: AC

## 2024-09-08 NOTE — Telephone Encounter (Signed)
-----   Message from Andrez DELENA Foy sent at 09/08/2024  7:52 AM EST ----- Regarding: RE: prednisone  I sent in prednisone  rx.  Jon, please remind her that she should take Benadryl 25 mg po with last dose of prednisone  1 hr prior to scan. Thank you ----- Message ----- From: Cornelius Elizabth DEL, MD Sent: 09/08/2024  12:00 AM EST To: Regla DEL Cornelius, MD; Jon CHRISTELLA Sar, LPN# Subject: prednisone                                      Hx allergy CT contrast, will need Rx for prednisone  prior to 1/9 scan

## 2024-09-08 NOTE — Telephone Encounter (Signed)
 Attempted to contact patient to let her know about the the Benadryl. No answer and no VM.

## 2024-09-09 ENCOUNTER — Other Ambulatory Visit: Payer: Self-pay | Admitting: Oncology

## 2024-09-09 ENCOUNTER — Telehealth: Payer: Self-pay

## 2024-09-09 DIAGNOSIS — C3491 Malignant neoplasm of unspecified part of right bronchus or lung: Secondary | ICD-10-CM

## 2024-09-09 NOTE — Telephone Encounter (Signed)
-----   Message from Andrez DELENA Foy sent at 09/08/2024  7:52 AM EST ----- Regarding: RE: prednisone  I sent in prednisone  rx.  Jon, please remind her that she should take Benadryl 25 mg po with last dose of prednisone  1 hr prior to scan. Thank you ----- Message ----- From: Cornelius Elizabth DEL, MD Sent: 09/08/2024  12:00 AM EST To: Regla DEL Cornelius, MD; Jon CHRISTELLA Sar, LPN# Subject: prednisone                                      Hx allergy CT contrast, will need Rx for prednisone  prior to 1/9 scan

## 2024-09-09 NOTE — Telephone Encounter (Signed)
 Patient does not want contrast with scans so scans were cancelled. Will do them if we can do without contrast per patient.  She states: She is trying to avoid things that can cause problems/issues.

## 2024-09-10 ENCOUNTER — Ambulatory Visit: Admitting: Oncology

## 2024-09-11 ENCOUNTER — Other Ambulatory Visit (HOSPITAL_BASED_OUTPATIENT_CLINIC_OR_DEPARTMENT_OTHER): Admitting: Radiology

## 2024-09-16 ENCOUNTER — Inpatient Hospital Stay

## 2024-09-16 ENCOUNTER — Telehealth: Payer: Self-pay | Admitting: Oncology

## 2024-09-16 ENCOUNTER — Inpatient Hospital Stay: Admitting: Oncology

## 2024-09-16 ENCOUNTER — Other Ambulatory Visit (HOSPITAL_BASED_OUTPATIENT_CLINIC_OR_DEPARTMENT_OTHER): Admitting: Radiology

## 2024-09-16 NOTE — Telephone Encounter (Signed)
 09/16/24 Left msg with rescheduled appts.

## 2024-09-18 ENCOUNTER — Encounter (HOSPITAL_COMMUNITY): Payer: Self-pay

## 2024-09-18 ENCOUNTER — Ambulatory Visit (HOSPITAL_BASED_OUTPATIENT_CLINIC_OR_DEPARTMENT_OTHER)
Admission: RE | Admit: 2024-09-18 | Discharge: 2024-09-18 | Disposition: A | Discharge status: Home / Self Care | Source: Ambulatory Visit | Attending: Oncology | Admitting: Oncology

## 2024-09-18 DIAGNOSIS — C3491 Malignant neoplasm of unspecified part of right bronchus or lung: Secondary | ICD-10-CM

## 2024-09-21 ENCOUNTER — Telehealth: Payer: Self-pay

## 2024-09-21 ENCOUNTER — Other Ambulatory Visit: Payer: Self-pay

## 2024-09-21 ENCOUNTER — Encounter (HOSPITAL_COMMUNITY): Payer: Self-pay

## 2024-09-21 ENCOUNTER — Other Ambulatory Visit (HOSPITAL_COMMUNITY): Payer: Self-pay

## 2024-09-21 NOTE — Telephone Encounter (Signed)
 Patient called in and voiced she wanted to stop all further treatment with the Cancer center. She voiced she did not like monthly calls to get medications from the speciality pharmacy for her  tagrisso . She wanted to do herbal medications and supplements only. No appointments cancelled  at this time. Wanted you to review first.

## 2024-09-21 NOTE — Progress Notes (Signed)
 Specialty Pharmacy Refill Coordination Note  Andrea Holloway is a 49 y.o. female contacted today regarding refills of specialty medication(s) Osimertinib  Mesylate (TAGRISSO )   Patient requested Delivery   Delivery date: 09/22/24   Verified address: [address] [place] [address]   Medication will be filled on: 09/21/24

## 2024-09-22 ENCOUNTER — Other Ambulatory Visit: Payer: Self-pay

## 2024-09-22 NOTE — Progress Notes (Addendum)
 Patient has messaged the office saying she will no longer come in for appointments.  Dr. Cornelius requested that we no longer dispense the medication.  Disenrolling from Specialty Pharmacy services at this time.  Patient will need to follow up with the providers office if she would like to restart therapy.

## 2024-09-24 ENCOUNTER — Encounter: Payer: Self-pay | Admitting: Oncology

## 2024-09-25 ENCOUNTER — Inpatient Hospital Stay

## 2024-09-25 ENCOUNTER — Inpatient Hospital Stay: Admitting: Oncology

## 2024-09-25 NOTE — Progress Notes (Incomplete)
 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 These are consistent with metastases but she has not had scans in 6 months and I think we need to reassess. A PET scan would be helpful to see if these lesions would light up and look for the status of her lung primary and assess any other metastatic disease. She declines a spine biopsy. She is having escalating pain and radicular neuropathy so we will start her on Gabapentin  300 mg HS. PET scan done on 03/11/2024 which revealed a 1.8 x 1.3cm nodular area of soft tissue density airspace consolidation, in the upper lobe of the right lung with a max SUV of 3.8 and 1.8 x 1.2cm area of soft tissue density airspace consolidation, in the middle lobe of the right lung with a max SUV of 9.4. No uptake was seen elsewhere and no skeletal lesions were found. She is interested in starting Ivermectin and Febendazole for treatment of her cancer and stopping the Tagrisso . She reports unwanted side effects from her current Tagrisso  treatment, including heart palpitations, altered mental status, and mouth sores. I reassured her that this is her choice, but emphasized that her cancer is not cured and is effectively controlled with Tagrisso . She does have anxiety about the effects of Tagrisso  on the heart and I informed her that Tagrisso  is not damaging to the heart. She is adamant about discontinuing Tagrisso  and starting Ivermectin and Febendazole. We will get a baseline CT scan now.  Possible Mass in the Upper Outer Quadrant of the Posterior Left Breast This measures 1.5 cm. this is partially obscured and indistinct and her breasts are heterogeneously dense. Examination is more  consistent with fibrocystic changes and she refuses a biopsy at this time. We will plan to repeat a ultrasound now since it is nearly 6 months.  Dental Issues The stomatitis is probably related to the Tagrisso  but her receeding gums and teeth falling out is not usually seen with this. She needs evaluation by a dentist and/or oral surgeon.   Tobacco Abuse She has cut back but continues to smoke 1 half pack per day.   Plan:   She feels poorly and is here to discuss her plans to change therapy. She is interested in starting Ivermectin and Febendazole for treatment of her cancer and stopping the Tagrisso . She reports unwanted side effects from her current Tagrisso  treatment, including heart palpitations, altered mental status, and mouth sores. I reassured her that this is her choice, but emphasized that her cancer is not cured and is effectively controlled with Tagrisso . She does have anxiety about the effects of Tagrisso  on the heart and I informed her that Tagrisso  is not damaging to the heart.  We have had several discussions about this treatment, and she requests that I continue to follow her. She is adamant about discontinuing Tagrisso  and starting Ivermectin and Febendazole. I suggested that she have a CT chest, abdomen, pelvis without contrast now to have a baseline scan to monitor any new changes. She was found to have an indeterminate mass in the left breast at 2 o'clock measuring 1.5 cm in June 2025. A biopsy was discussed, but she refused and is now willing to have a  repeat ultrasound to monitor this area. I will order a left breast ultrasound for her. She also inquires about having a mastectomy and I informed her that it may be inadvisable to have this done without a cancer diagnosis. She had a CT head without contrast on 07/03/2024 which revealed no acute intracranial abnormality. I will see her back in 3 months with CBC and CMP. If she is doing well, we will repeat a CT chest in 6 months.    The patient was provided an opportunity to ask questions and all were answered. The patient understands all the plans discussed today and is in agreement with them.   I provided *** minutes of face-to-face time during this this encounter and > 50% was spent counseling as documented under my assessment and plan.   Matsue VEAR Cornish, MD  [facility] DEPT OF [facility] [address] [place] [address] Dept: [phone] Dept Fax: [phone]   CHIEF COMPLAINT:  CC:  EGFR mutation positive Stage IVA (T4 N2 M1a) lung adenocarcinoma   Current Treatment:  Tagrisso    HISTORY OF PRESENT ILLNESS:  Andrea Holloway is a 49 y.o. female with stage IVA (T4 N2 M1a) lung adenocarcinoma.  The stage IVA designation was based upon her initially having a malignant pleural effusion.  She had no evidence of distant metastasis.  As testing showed her to harbor  the EGFR mutation, she has been taking osimertinib  since July 2022.  She comes in today to go over there MRI of her thoracic spine, which was done due to her having increased back pain.  This pain has persisted over these past weeks.  She denies losing control of her bladder or bowel function.  The pain essentially does not radiate.   She had a screening mammogram in December 2024 which revealed architectural distortion in the right breast which corresponds to the site of her biopsy scar.  However in the left breast there is an isodense mass and a diagnostic mammogram with ultrasound was requested.  She finally had the diagnostic mammogram on February 14, 2024 and her breasts are heterogeneously dense.  In the upper outer quadrant of the posterior left breast there is an obscured mass measuring 1.5 cm.  The ultrasound confirmed I have a hypoechoic mass with indistinct margins measuring 1.5 cm and located at 2:00, 12 cm from the nipple.  An ultrasound biopsy was recommended but the patient declined.   They therefore recommended a repeat left breast ultrasound in 6 months.  The patient does not want to have any biopsies done at this time.  He also declines a biopsy of the thoracic spine.  I have reviewed her chart and materials related to her cancer extensively and collaborated history with the patient. Summary of oncologic history is as follows: Oncology History  Malignant neoplasm of upper lobe of right lung (HCC)  02/03/2021 Initial Diagnosis   Malignant neoplasm of upper lobe of right lung (HCC)   02/13/2021 - 02/13/2021 Chemotherapy         02/14/2021 Cancer Staging   Staging form: Lung, AJCC 8th Edition - Clinical stage from 02/14/2021: Stage IVA (cT4, cN2, cM1a) - Signed by Ezzard Valaria LABOR, MD on 12/27/2023 Histopathologic type: Adenocarcinoma, NOS Stage prefix: Initial diagnosis   02/20/2021 - 02/20/2021 Chemotherapy          INTERVAL HISTORY:  Edy is seen in the clinic for follow up of her  EGFR mutation positive L747(exon19  deletion) stage IVA (T4 N2 M1a) lung adenocarcinoma. She was placed on Tagrisso  by July, 2022 and has had a good response. Patient states that she feels *** and ***.         She has a WBC of ***, hemoglobin of ***, and platelet count of ***.      I will see her back in *** with ***.    She denies fever, chills, night sweats, or other signs of infection. She denies cardiorespiratory and gastrointestinal issues. She  denies pain. Her appetite is *** and Her weight {Weight change:10426}.  Wt Readings from Last 3 Encounters:  07/16/24 139 lb (63 kg)  06/29/24 139 lb 3.2 oz (63.1 kg)  06/11/24 139 lb 6.4 oz (63.2 kg)   Patient states that she feels poorly and is here to discuss her plans to change therapy. She is interested in starting Ivermectin and Febendazole for treatment of her cancer and stopping the Tagrisso . She reports unwanted side effects from her current Tagrisso  treatment, including heart palpitations, altered mental status, and  mouth sores. I reassured her that this is her choice, but emphasized that her cancer is not cured and is effectively controlled with Tagrisso . She does have anxiety about the effects of Tagrisso  on the heart and I informed her that Tagrisso  is not damaging to the heart. She is adamant about discontinuing Tagrisso  and starting Ivermectin and Febendazole. I suggested that she have a CT chest, abdomen, pelvis without contrast now to have a baseline scan to monitor any new changes. She was found to have an indeterminate mass in the left breast at 2 o'clock measuring 1.5 cm in June 2025. A biopsy was discussed, but she refused and is now willing to have a repeat ultrasound to monitor this area. I will order a left breast ultrasound for her. She also inquires about having a mastectomy and I informed her that it may be inadvisable to have this done without a cancer diagnosis. She had a CT head without contrast on 07/03/2024 which revealed no acute intracranial abnormality. I will see her back in 3 months with CBC and CMP. If she is doing well, we will repeat a CT chest in 6 months. She denies fever, chills, night sweats, or other signs of infection. She denies gastrointestinal issues. Her appetite is fair and Her weight has been stable. She is 139 lb today.  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 lung (HCC) 02/03/2021   Palpitations    Seizures (HCC)     Past Surgical History:  Procedure Laterality Date   CARDIAC SURGERY     SAC OF FLUID BEHIND HEART @ 49 YEARS OF AGE   MYRINGOTOMY WITH TUBE PLACEMENT Bilateral    TUBAL LIGATION      Family History  Problem Relation Age of Onset   Hodgkin's lymphoma Sister 56   Thyroid  cancer Maternal Grandmother 74    Prostate cancer Maternal Grandfather 52   Penile cancer Maternal Grandfather 21   Breast cancer Maternal Aunt 40   Colon cancer Cousin        dx unknown age   Colon cancer Cousin        dx unknown age  Colon cancer Cousin        dx unknown age   Lung cancer Cousin 10   Brain cancer Cousin     Social History:  reports that she has been smoking cigarettes. She has never used smokeless tobacco. She reports that she does not currently use alcohol. She reports current drug use. Drug: Marijuana.  Allergies:  Allergies  Allergen Reactions   Iodinated Contrast Media Hives   Iodine     Topical iodine -does fine with Iodinated contrast- was given Isovue  300 on 08/01/2021 without complications    Current Medications: Current Outpatient Medications  Medication Sig Dispense Refill   predniSONE  (DELTASONE ) 50 MG tablet Take 1 tablet (50 mg total) by mouth as directed. Take 1 tab 13 hr prior to scan, 1 tab 7 hr prior to scan and 1 tab 1 hr prior to scan 3 tablet 0   ciprofloxacin  (CIPRO ) 500 MG tablet Take 1 tablet (500 mg total) by mouth 2 (two) times daily. (Patient not taking: Reported on 07/16/2024) 14 tablet 0   nitrofurantoin , macrocrystal-monohydrate, (MACROBID ) 100 MG capsule Take 1 capsule (100 mg total) by mouth 2 (two) times daily. (Patient not taking: Reported on 07/16/2024) 14 capsule 0   No current facility-administered medications for this visit.   REVIEW OF SYSTEMS:  Review of Systems  Constitutional:  Positive for appetite change (better) and fatigue. Negative for chills, diaphoresis, fever and unexpected weight change.  HENT:   Positive for mouth sores. Negative for hearing loss, lump/mass, nosebleeds, sore throat, tinnitus, trouble swallowing and voice change.        Dental issues (teeth pain and tooth loss)  Eyes: Negative.  Negative for eye problems and icterus.  Respiratory:  Positive for cough. Negative for chest tightness, hemoptysis, shortness of breath and  wheezing.   Cardiovascular: Negative.  Negative for chest pain, leg swelling and palpitations.       Heart palpitations  Gastrointestinal:  Positive for nausea (occasional). Negative for abdominal distention, abdominal pain, blood in stool, constipation, diarrhea, rectal pain and vomiting.  Endocrine: Positive for hot flashes (2-3x daily).  Genitourinary:  Positive for dysuria (intermittent) and frequency (intermittent). Negative for bladder incontinence, difficulty urinating, dyspareunia, hematuria, menstrual problem, nocturia, pelvic pain, vaginal bleeding and vaginal discharge.   Musculoskeletal:  Positive for back pain. Negative for arthralgias, flank pain, gait problem, myalgias, neck pain and neck stiffness.  Skin: Negative.  Negative for itching, rash and wound.  Neurological:  Negative for dizziness, extremity weakness, gait problem, headaches, light-headedness, numbness, seizures and speech difficulty.  Hematological: Negative.  Negative for adenopathy. Does not bruise/bleed easily.  Psychiatric/Behavioral:  Positive for depression. Negative for confusion, decreased concentration, sleep disturbance and suicidal ideas. The patient is nervous/anxious.        The patient feels she has altered mental status from the Tagrisso     VITALS:  Last menstrual period 12/04/2020.  Wt Readings from Last 3 Encounters:  07/16/24 139 lb (63 kg)  06/29/24 139 lb 3.2 oz (63.1 kg)  06/11/24 139 lb 6.4 oz (63.2 kg)  There is no height or weight on file to calculate BMI.  Performance status (ECOG): 1 - Symptomatic but completely ambulatory  PHYSICAL EXAM:  Physical Exam Vitals and nursing note reviewed. Exam conducted with a chaperone present.  Constitutional:      General: She is not in acute distress.    Appearance: Normal appearance. She is normal weight. She is not ill-appearing, toxic-appearing or diaphoretic.  HENT:     Head: Normocephalic  and atraumatic.     Right Ear: Tympanic membrane, ear  canal and external ear normal. There is no impacted cerumen.     Left Ear: Tympanic membrane, ear canal and external ear normal. There is no impacted cerumen.     Nose: Nose normal. No congestion or rhinorrhea.     Mouth/Throat:     Mouth: Mucous membranes are moist.     Pharynx: Oropharynx is clear. No oropharyngeal exudate or posterior oropharyngeal erythema.  Eyes:     General: No scleral icterus.       Right eye: No discharge.        Left eye: No discharge.     Extraocular Movements: Extraocular movements intact.     Conjunctiva/sclera: Conjunctivae normal.     Pupils: Pupils are equal, round, and reactive to light.  Neck:     Vascular: No carotid bruit.  Cardiovascular:     Rate and Rhythm: Normal rate and regular rhythm.     Pulses: Normal pulses.     Heart sounds: Normal heart sounds. No murmur heard.    No friction rub. No gallop.  Pulmonary:     Effort: Pulmonary effort is normal. No respiratory distress.     Breath sounds: No stridor. Examination of the right-lower field reveals wheezing. Wheezing present. No rhonchi or rales.  Chest:     Chest wall: No tenderness.     Comments: A small, seemingly benign skin tag/lesion is 3-4 cm medial to her left breast Well healed scar in the far upper right chest  Abdominal:     General: Bowel sounds are normal. There is no distension.     Palpations: Abdomen is soft. There is no hepatomegaly, splenomegaly or mass.     Tenderness: There is no abdominal tenderness. There is no right CVA tenderness, left CVA tenderness, guarding or rebound.     Hernia: No hernia is present.  Musculoskeletal:        General: No swelling, tenderness, deformity or signs of injury. Normal range of motion.     Cervical back: Normal range of motion and neck supple. No rigidity or tenderness.     Right lower leg: No edema.     Left lower leg: No edema.  Lymphadenopathy:     Cervical: No cervical adenopathy.     Right cervical: No superficial, deep or  posterior cervical adenopathy.    Left cervical: No superficial, deep or posterior cervical adenopathy.     Upper Body:     Right upper body: No supraclavicular, axillary or pectoral adenopathy.     Left upper body: No supraclavicular, axillary or pectoral adenopathy.  Skin:    General: Skin is warm and dry.     Coloration: Skin is not jaundiced or pale.     Findings: No bruising, erythema, lesion or rash.  Neurological:     General: No focal deficit present.     Mental Status: She is alert and oriented to person, place, and time. Mental status is at baseline.     Cranial Nerves: No cranial nerve deficit.     Sensory: No sensory deficit.     Motor: No weakness.     Coordination: Coordination normal.     Gait: Gait normal.     Deep Tendon Reflexes: Reflexes normal.  Psychiatric:        Mood and Affect: Mood normal.        Behavior: Behavior normal.        Thought Content: Thought content normal.  Judgment: Judgment normal.    LABS:      Latest Ref Rng & Units 06/29/2024   12:36 PM 06/11/2024    3:19 PM 02/28/2024   10:16 AM  CBC  WBC 4.0 - 10.5 K/uL 6.0  4.8  5.4   Hemoglobin 12.0 - 15.0 g/dL 85.6  85.9  85.4   Hematocrit 36.0 - 46.0 % 40.7  40.2  42.5   Platelets 150 - 400 K/uL 155  143  159       Latest Ref Rng & Units 06/29/2024   12:36 PM 06/11/2024    3:19 PM 02/28/2024   10:16 AM  CMP  Glucose 70 - 99 mg/dL 92  98  97   BUN 6 - 20 mg/dL 9  9  9    Creatinine 0.44 - 1.00 mg/dL 9.08  8.92  9.13   Sodium 135 - 145 mmol/L 139  139  138   Potassium 3.5 - 5.1 mmol/L 4.0  3.3  3.9   Chloride 98 - 111 mmol/L 102  103  103   CO2 22 - 32 mmol/L 25  25  25    Calcium 8.9 - 10.3 mg/dL 9.8  9.5  9.8   Total Protein 6.5 - 8.1 g/dL 7.1  6.9  7.2   Total Bilirubin 0.0 - 1.2 mg/dL 0.4  0.4  0.5   Alkaline Phos 38 - 126 U/L 89  83  89   AST 15 - 41 U/L 22  17  20    ALT 0 - 44 U/L 11  <5  8    Lab Results  Component Value Date   CEA 2.82 02/28/2024   /  CEA [facility])   Date Value Ref Range Status  02/28/2024 2.82 0.00 - 5.00 ng/mL Final    Comment:    (NOTE) This test was performed using Beckman Coulter's paramagnetic chemiluminescent immunoassay. Values obtained from different assay methods cannot be used interchangeably. Please note that up to 8% of patients who smoke may see values 5.1-10.0 ng/ml and 1% of patients who smoke may see CEA levels >10.0 ng/ml. Performed at [facility], [address], [place], [place] [address]    No results found for: PSA1 No results found for: RJW800 No results found for: CAN125  No results found for: TOTALPROTELP, ALBUMINELP, A1GS, A2GS, BETS, BETA2SER, GAMS, MSPIKE, SPEI No results found for: TIBC, FERRITIN, IRONPCTSAT No results found for: LDH  STUDIES:  Exam: 03/11/2024 PET Skull Base to Mid Thigh Impression: There are 2 nodules in the upper lobe of the right lung demonstrating hypermetabolic activity consistent with bronchogenic carcinoma.  1.8 x 1.3cm nodular area of soft tissue density airspace consolidation, in the upper lobe of the right lung, demonstrating FDG activity with a max SUV of 3.8.  1.8 x 1.2cm area of soft tissue density airspace consolidation, in the middle lobe of the right lung, demonstrating FDG activity with a max SUV of 9.4.      I,Yazmyn M Lowe,acting as a scribe for May VEAR Cornish, MD.,have documented all relevant documentation on the behalf of Tenecia VEAR Cornish, MD,as directed by  Kaydra VEAR Cornish, MD while in the presence of Ritu VEAR Cornish, MD.  I have reviewed this report as typed by the medical scribe, and it is complete and accurate   "

## 2024-09-28 ENCOUNTER — Other Ambulatory Visit: Payer: Self-pay

## 2024-09-29 ENCOUNTER — Other Ambulatory Visit: Payer: Self-pay

## 2024-10-05 ENCOUNTER — Other Ambulatory Visit: Payer: Self-pay

## 2024-10-06 ENCOUNTER — Telehealth: Payer: Self-pay

## 2024-10-06 ENCOUNTER — Other Ambulatory Visit: Payer: Self-pay

## 2024-10-06 ENCOUNTER — Other Ambulatory Visit: Payer: Self-pay | Admitting: Oncology

## 2024-10-06 DIAGNOSIS — C3490 Malignant neoplasm of unspecified part of unspecified bronchus or lung: Secondary | ICD-10-CM

## 2024-10-06 MED ORDER — OSIMERTINIB MESYLATE 80 MG PO TABS
80.0000 mg | ORAL_TABLET | Freq: Every day | ORAL | 0 refills | Status: AC
  Filled 2024-10-06: qty 30, 30d supply, fill #0

## 2024-10-06 NOTE — Telephone Encounter (Signed)
 Pt LVM on nurse line stating, [facility] pharmacy are refusing to send me the Tagrisso . I have been out since the 10th. I have called them umpteen times. I sent a MyChart message on the 16th. At this point, I just want a referral to [facility]. If you can help me with that, please let me know.

## 2024-10-07 ENCOUNTER — Other Ambulatory Visit: Payer: Self-pay

## 2024-10-07 NOTE — Progress Notes (Signed)
 Clinical Intervention Note  Clinical Intervention Notes: Patient reports being out of medication since January 10th. Patient had been contacted multiple times from specialty pharmacy for refill but did not call us  back. Chart showed that patient did not want to continue Tagrisso  and wanted to seek alternative treatment. Patient had also messaged the office and told them that she was no longer going to come in for appointments and Dr. Cornelius asked that we discontinue refill that was left on file. Patient reached back out to the office asking to continue Tagrisso  until her appointment and Dr. Cornelius sent in a one time fill.   Clinical Intervention Outcomes: Improved therapy adherence; Improved therapy effectiveness   [facility] Specialty Pharmacist

## 2024-10-07 NOTE — Progress Notes (Signed)
 Specialty Pharmacy Refill Coordination Note  Chaneka Trefz is a 49 y.o. female contacted today regarding refills of specialty medication(s) Osimertinib  Mesylate (TAGRISSO )   Patient requested Delivery   Delivery date: 10/08/24   Verified address: [address] [place] [address]   Medication will be filled on: 10/07/24

## 2024-10-07 NOTE — Progress Notes (Signed)
 Specialty Pharmacy Ongoing Clinical Assessment Note  Julyana Holloway is a 49 y.o. female who is being followed by the specialty pharmacy service for RxSp Oncology   Patient's specialty medication(s) reviewed today: Osimertinib  Mesylate (TAGRISSO )   Missed doses in the last 4 weeks: All   Patient/Caregiver did not have any additional questions or concerns.   Therapeutic benefit summary: Patient is NOT achieving benefit   Adverse events/side effects summary: Unable to assess   Patient's therapy is appropriate to: Continue    Goals Addressed             This Visit's Progress    Slow Disease Progression   Worsening    Patient is not on track and worsening. Patient will work on increased adherence and be monitored by provider to determine if a change in treatment plan is warranted. Per visit on 07/16/24, chest CT in June showed mass on left breast. CT of head in October of 2025 showed no abnormalities. Patient reported at visit that she does not feel well and does not want to continue taking Tagrisso , instead she would like to try ivermectin and febendazole for treatment. Patient has an upcoming appointment to discuss CT of abdomen, which per Dr. Cornelius, shows progression.         Follow up: 3 months  [facility]

## 2024-10-08 ENCOUNTER — Other Ambulatory Visit: Payer: Self-pay

## 2024-10-09 ENCOUNTER — Other Ambulatory Visit: Payer: Self-pay

## 2024-10-09 ENCOUNTER — Telehealth: Payer: Self-pay

## 2024-10-09 NOTE — Telephone Encounter (Signed)
 Pt LVM on nurse line that she hasn't received the Tagrisso  yet. States Devere told her it has been ordered.   Hailey Griffin,RPH: Because she is in [place], it has to go [employer] and they're saying it is delayed due to weather. Now delivery date is 2/9. Not sure if it would help, but here is her tracking number: [id number].   There isn't, not a retail/walk in kind of pharmacy anyway. She may would have to pay out of pocket for it as well since we have it billed here and we've released it to her.

## 2024-10-16 ENCOUNTER — Inpatient Hospital Stay

## 2024-10-16 ENCOUNTER — Inpatient Hospital Stay: Admitting: Oncology

## 2024-10-22 ENCOUNTER — Inpatient Hospital Stay

## 2024-10-22 ENCOUNTER — Inpatient Hospital Stay: Admitting: Oncology
